# Patient Record
Sex: Female | Born: 1990 | Hispanic: No | Marital: Single | Smoking: Current every day smoker
Health system: Southern US, Community
[De-identification: ages and names within clinical notes are randomized; demographics above are authoritative.]

## PROBLEM LIST (undated history)

## (undated) ENCOUNTER — Inpatient Hospital Stay (HOSPITAL_COMMUNITY): Payer: Self-pay

## (undated) DIAGNOSIS — G8929 Other chronic pain: Secondary | ICD-10-CM

## (undated) DIAGNOSIS — F319 Bipolar disorder, unspecified: Secondary | ICD-10-CM

## (undated) DIAGNOSIS — F1994 Other psychoactive substance use, unspecified with psychoactive substance-induced mood disorder: Secondary | ICD-10-CM

## (undated) DIAGNOSIS — S36113A Laceration of liver, unspecified degree, initial encounter: Secondary | ICD-10-CM

## (undated) DIAGNOSIS — F32A Depression, unspecified: Secondary | ICD-10-CM

## (undated) DIAGNOSIS — D649 Anemia, unspecified: Secondary | ICD-10-CM

## (undated) DIAGNOSIS — F4310 Post-traumatic stress disorder, unspecified: Secondary | ICD-10-CM

## (undated) DIAGNOSIS — F329 Major depressive disorder, single episode, unspecified: Secondary | ICD-10-CM

## (undated) DIAGNOSIS — F419 Anxiety disorder, unspecified: Secondary | ICD-10-CM

## (undated) DIAGNOSIS — F1910 Other psychoactive substance abuse, uncomplicated: Secondary | ICD-10-CM

## (undated) DIAGNOSIS — N760 Acute vaginitis: Secondary | ICD-10-CM

## (undated) DIAGNOSIS — J939 Pneumothorax, unspecified: Secondary | ICD-10-CM

## (undated) DIAGNOSIS — S2239XA Fracture of one rib, unspecified side, initial encounter for closed fracture: Secondary | ICD-10-CM

## (undated) DIAGNOSIS — B9689 Other specified bacterial agents as the cause of diseases classified elsewhere: Secondary | ICD-10-CM

## (undated) DIAGNOSIS — S2249XA Multiple fractures of ribs, unspecified side, initial encounter for closed fracture: Secondary | ICD-10-CM

## (undated) DIAGNOSIS — M549 Dorsalgia, unspecified: Secondary | ICD-10-CM

## (undated) HISTORY — PX: TONSILLECTOMY: SUR1361

## (undated) HISTORY — PX: ADENOIDECTOMY: SHX5191

## (undated) HISTORY — DX: Other specified bacterial agents as the cause of diseases classified elsewhere: B96.89

## (undated) HISTORY — DX: Acute vaginitis: N76.0

## (undated) HISTORY — DX: Other psychoactive substance abuse, uncomplicated: F19.10

## (undated) HISTORY — PX: BLADDER SURGERY: SHX569

---
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
## 2012-06-08 ENCOUNTER — Encounter (HOSPITAL_COMMUNITY): Payer: Self-pay | Admitting: *Deleted

## 2012-06-08 ENCOUNTER — Emergency Department (HOSPITAL_COMMUNITY)
Admission: EM | Admit: 2012-06-08 | Discharge: 2012-06-08 | Disposition: A | Discharge status: Home / Self Care | Payer: Medicaid Other | Attending: Emergency Medicine | Admitting: Emergency Medicine

## 2012-06-08 DIAGNOSIS — F17200 Nicotine dependence, unspecified, uncomplicated: Secondary | ICD-10-CM | POA: Insufficient documentation

## 2012-06-08 DIAGNOSIS — Z9889 Other specified postprocedural states: Secondary | ICD-10-CM | POA: Insufficient documentation

## 2012-06-08 DIAGNOSIS — N390 Urinary tract infection, site not specified: Secondary | ICD-10-CM | POA: Insufficient documentation

## 2012-06-08 LAB — URINE MICROSCOPIC-ADD ON

## 2012-06-08 LAB — URINALYSIS, ROUTINE W REFLEX MICROSCOPIC
Glucose, UA: NEGATIVE mg/dL
Specific Gravity, Urine: 1.025 (ref 1.005–1.030)
Urobilinogen, UA: 0.2 mg/dL (ref 0.0–1.0)

## 2012-06-08 LAB — PREGNANCY, URINE: Preg Test, Ur: NEGATIVE

## 2012-06-08 MED ORDER — HYDROCODONE-ACETAMINOPHEN 5-325 MG PO TABS
1.0000 | ORAL_TABLET | Freq: Once | ORAL | Status: AC
  Administered 2012-06-08: 1 via ORAL
  Filled 2012-06-08: qty 1

## 2012-06-08 MED ORDER — NITROFURANTOIN MONOHYD MACRO 100 MG PO CAPS: 100.0000 mg | ORAL_CAPSULE | Freq: Two times a day (BID) | ORAL | Status: DC

## 2012-06-08 MED ORDER — TRAMADOL HCL 50 MG PO TABS: 50.0000 mg | ORAL_TABLET | Freq: Four times a day (QID) | ORAL | Status: DC | PRN

## 2012-06-08 NOTE — ED Provider Notes (Signed)
History     CSN: [id number]  Arrival date & time 06/08/12  1643   First MD Initiated Contact with Patient 06/08/12 1944      Chief Complaint  Patient presents with  . Hematuria     HPI Pt was seen at 2010. Per pt, c/o gradual onset and persistence of constant dysuria x1 day.  Has been associated with hematuria.  Denies vaginal bleeding/discharge, no flank pain, no N/V/D, no fevers, no abd pain, no rash.     History reviewed. No pertinent past medical history.  Past Surgical History  Procedure Date  . Bladder surgery   . Tonsillectomy   . Adenoidectomy      History  Substance Use Topics  . Smoking status: Current Every Day Smoker  . Smokeless tobacco: Not on file  . Alcohol Use: Yes    Review of Systems ROS: Statement: All systems negative except as marked or noted in the HPI; Constitutional: Negative for fever and chills. ; ; Eyes: Negative for eye pain, redness and discharge. ; ; ENMT: Negative for ear pain, hoarseness, nasal congestion, sinus pressure and sore throat. ; ; Cardiovascular: Negative for chest pain, palpitations, diaphoresis, dyspnea and peripheral edema. ; ; Respiratory: Negative for cough, wheezing and stridor. ; ; Gastrointestinal: Negative for nausea, vomiting, diarrhea, abdominal pain, blood in stool, hematemesis, jaundice and rectal bleeding. . ; ; Genitourinary: +dysuria, hematuria. Negative for flank pain.;; GYN:  No vaginal bleeding, no vaginal discharge, no vulvar pain.;;  Musculoskeletal: Negative for back pain and neck pain. Negative for swelling and trauma.; ; Skin: Negative for pruritus, rash, abrasions, blisters, bruising and skin lesion.; ; Neuro: Negative for headache, lightheadedness and neck stiffness. Negative for weakness, altered level of consciousness , altered mental status, extremity weakness, paresthesias, involuntary movement, seizure and syncope.       Allergies  Latex and Penicillins  Home Medications   Current Outpatient Rx    Name Route Sig Dispense Refill  . ACETAMINOPHEN 500 MG PO TABS Oral Take 500 mg by mouth every 6 (six) hours as needed. For pain      BP 130/83  Pulse 74  Temp 98.1 F (36.7 C) (Oral)  Resp 18  Ht 5\' 4"  (1.626 m)  Wt 155 lb (70.308 kg)  BMI 26.61 kg/m2  SpO2 100%  LMP 05/19/2012  Physical Exam 2015: Physical examination:  Nursing notes reviewed; Vital signs and O2 SAT reviewed;  Constitutional: Well developed, Well nourished, Well hydrated, In no acute distress; Head:  Normocephalic, atraumatic; Eyes: EOMI, PERRL, No scleral icterus; ENMT: Mouth and pharynx normal, Mucous membranes moist; Neck: Supple, Full range of motion, No lymphadenopathy; Cardiovascular: Regular rate and rhythm, No murmur, rub, or gallop; Respiratory: Breath sounds clear & equal bilaterally, No rales, rhonchi, wheezes.  Speaking full sentences with ease, Normal respiratory effort/excursion; Chest: Nontender, Movement normal; Abdomen: Soft, Nontender, Nondistended, Normal bowel sounds; Genitourinary: No CVA tenderness; Extremities: Pulses normal, No tenderness, No edema, No calf edema or asymmetry.; Neuro: AA&Ox3, Major CN grossly intact.  Speech clear. No gross focal motor or sensory deficits in extremities.; Skin: Color normal, Warm, Dry.   ED Course  Procedures    MDM  MDM Reviewed: nursing note and vitals Interpretation: labs     Results for orders placed during the hospital encounter of 06/08/12  URINALYSIS, ROUTINE W REFLEX MICROSCOPIC      Component Value Range   Color, Urine YELLOW  YELLOW   APPearance HAZY (*) CLEAR   Specific Gravity, Urine 1.025  1.005 -  1.030   pH 7.5  5.0 - 8.0   Glucose, UA NEGATIVE  NEGATIVE mg/dL   Hgb urine dipstick LARGE (*) NEGATIVE   Bilirubin Urine SMALL (*) NEGATIVE   Ketones, ur NEGATIVE  NEGATIVE mg/dL   Protein, ur >409 (*) NEGATIVE mg/dL   Urobilinogen, UA 0.2  0.0 - 1.0 mg/dL   Nitrite POSITIVE (*) NEGATIVE   Leukocytes, UA TRACE (*) NEGATIVE  PREGNANCY,  URINE      Component Value Range   Preg Test, Ur NEGATIVE  NEGATIVE  URINE MICROSCOPIC-ADD ON      Component Value Range   Squamous Epithelial / LPF RARE  RARE   WBC, UA 21-50  <3 WBC/hpf   RBC / HPF TOO NUMEROUS TO COUNT  <3 RBC/hpf   Bacteria, UA MANY (*) RARE    2020:  +UTI, will tx.  Pt requesting "something for pain stronger than tylenol."  Wants to go home now.  Dx and testing d/w pt.  Questions answered.  Verb understanding, agreeable to d/c home with outpt f/u.          Laray Anger, DO 06/10/12 2156

## 2012-06-08 NOTE — ED Notes (Signed)
Pt alert & oriented x4, stable gait. Patient given discharge instructions, paperwork & prescription(s). Patient  instructed to stop at the registration desk to finish any additional paperwork. Patient verbalized understanding. Pt left department w/ no further questions. 

## 2012-06-08 NOTE — ED Notes (Signed)
Hematuria x 1 week, dysuria, ,back and low abd pain..  Nausea, no vomiting

## 2012-06-10 LAB — URINE CULTURE

## 2012-06-11 NOTE — ED Notes (Signed)
+   urine Patient treated with Macrobid-sensitive to same-chart appended per protocol MD. 

## 2013-03-05 ENCOUNTER — Encounter (HOSPITAL_COMMUNITY): Payer: Self-pay | Admitting: Emergency Medicine

## 2013-03-05 ENCOUNTER — Emergency Department (HOSPITAL_COMMUNITY)
Admission: EM | Admit: 2013-03-05 | Discharge: 2013-03-05 | Disposition: A | Discharge status: Home / Self Care | Payer: Self-pay | Attending: Emergency Medicine | Admitting: Emergency Medicine

## 2013-03-05 DIAGNOSIS — S24101A Unspecified injury at T1 level of thoracic spinal cord, initial encounter: Secondary | ICD-10-CM | POA: Insufficient documentation

## 2013-03-05 DIAGNOSIS — Z880 Allergy status to penicillin: Secondary | ICD-10-CM | POA: Insufficient documentation

## 2013-03-05 DIAGNOSIS — Y929 Unspecified place or not applicable: Secondary | ICD-10-CM | POA: Insufficient documentation

## 2013-03-05 DIAGNOSIS — M545 Low back pain: Secondary | ICD-10-CM

## 2013-03-05 DIAGNOSIS — Y9389 Activity, other specified: Secondary | ICD-10-CM | POA: Insufficient documentation

## 2013-03-05 DIAGNOSIS — Z79899 Other long term (current) drug therapy: Secondary | ICD-10-CM | POA: Insufficient documentation

## 2013-03-05 DIAGNOSIS — Y990 Civilian activity done for income or pay: Secondary | ICD-10-CM | POA: Insufficient documentation

## 2013-03-05 DIAGNOSIS — IMO0002 Reserved for concepts with insufficient information to code with codable children: Secondary | ICD-10-CM | POA: Insufficient documentation

## 2013-03-05 DIAGNOSIS — Z91040 Latex allergy status: Secondary | ICD-10-CM | POA: Insufficient documentation

## 2013-03-05 DIAGNOSIS — X503XXA Overexertion from repetitive movements, initial encounter: Secondary | ICD-10-CM | POA: Insufficient documentation

## 2013-03-05 DIAGNOSIS — F17200 Nicotine dependence, unspecified, uncomplicated: Secondary | ICD-10-CM | POA: Insufficient documentation

## 2013-03-05 MED ORDER — TRAMADOL HCL 50 MG PO TABS
100.0000 mg | ORAL_TABLET | Freq: Once | ORAL | Status: DC
  Filled 2013-03-05: qty 2

## 2013-03-05 MED ORDER — PREDNISONE 10 MG PO TABS: ORAL_TABLET | ORAL | Status: DC

## 2013-03-05 MED ORDER — METOCLOPRAMIDE HCL 10 MG PO TABS
10.0000 mg | ORAL_TABLET | Freq: Once | ORAL | Status: AC
  Administered 2013-03-05: 10 mg via ORAL
  Filled 2013-03-05: qty 1

## 2013-03-05 MED ORDER — HYDROCODONE-ACETAMINOPHEN 5-325 MG PO TABS: 1.0000 | ORAL_TABLET | ORAL | Status: DC | PRN

## 2013-03-05 MED ORDER — PROMETHAZINE HCL 12.5 MG PO TABS
12.5000 mg | ORAL_TABLET | Freq: Once | ORAL | Status: DC
  Filled 2013-03-05: qty 1

## 2013-03-05 MED ORDER — PREDNISONE 50 MG PO TABS
60.0000 mg | ORAL_TABLET | Freq: Once | ORAL | Status: AC
  Administered 2013-03-05: 60 mg via ORAL
  Filled 2013-03-05: qty 1

## 2013-03-05 MED ORDER — HYDROCODONE-ACETAMINOPHEN 5-325 MG PO TABS
2.0000 | ORAL_TABLET | Freq: Once | ORAL | Status: AC
  Administered 2013-03-05: 2 via ORAL
  Filled 2013-03-05: qty 2

## 2013-03-05 NOTE — ED Provider Notes (Signed)
CSN: [id number]     Arrival date & time 03/05/13  1939 History     First MD Initiated Contact with Patient 03/05/13 2002     Chief Complaint  Patient presents with  . Back Pain   (Consider location/radiation/quality/duration/timing/severity/associated sxs/prior Treatment) Patient is a 22 y.o. female presenting with back pain. The history is provided by the patient.  Back Pain Location:  Thoracic spine and lumbar spine Quality:  Aching and shooting Radiates to:  L thigh and R thigh Pain severity:  Severe Pain is:  Same all the time Onset quality:  Gradual Duration:  1 month Timing:  Intermittent Progression:  Worsening Context: lifting heavy objects   Relieved by:  Nothing Worsened by:  Movement Associated symptoms: no abdominal pain, no bladder incontinence, no bowel incontinence, no chest pain, no dysuria and no perianal numbness     History reviewed. No pertinent past medical history. Past Surgical History  Procedure Laterality Date  . Bladder surgery    . Tonsillectomy    . Adenoidectomy     No family history on file. History  Substance Use Topics  . Smoking status: Current Every Day Smoker -- 0.50 packs/day    Types: Cigarettes  . Smokeless tobacco: Not on file  . Alcohol Use: No   OB History   Grav Para Term Preterm Abortions TAB SAB Ect Mult Living                 Review of Systems  Constitutional: Negative for activity change.       All ROS Neg except as noted in HPI  HENT: Negative for nosebleeds and neck pain.   Eyes: Negative for photophobia and discharge.  Respiratory: Negative for cough, shortness of breath and wheezing.   Cardiovascular: Negative for chest pain and palpitations.  Gastrointestinal: Negative for abdominal pain, blood in stool and bowel incontinence.  Genitourinary: Negative for bladder incontinence, dysuria, frequency and hematuria.  Musculoskeletal: Positive for back pain. Negative for arthralgias.  Skin: Negative.    Neurological: Negative for dizziness, seizures and speech difficulty.  Psychiatric/Behavioral: Negative for hallucinations and confusion.    Allergies  Latex and Penicillins  Home Medications   Current Outpatient Rx  Name  Route  Sig  Dispense  Refill  . acetaminophen (TYLENOL) 500 MG tablet   Oral   Take 500 mg by mouth every 6 (six) hours as needed. For pain         . nitrofurantoin, macrocrystal-monohydrate, (MACROBID) 100 MG capsule   Oral   Take 1 capsule (100 mg total) by mouth 2 (two) times daily.   14 capsule   0   . traMADol (ULTRAM) 50 MG tablet   Oral   Take 1 tablet (50 mg total) by mouth every 6 (six) hours as needed for pain.   15 tablet   0    BP 130/80  Pulse 60  Temp(Src) 98.4 F (36.9 C) (Oral)  Resp 18  Ht 5\' 3"  (1.6 m)  Wt 140 lb (63.504 kg)  BMI 24.81 kg/m2  SpO2 95%  LMP 02/13/2013 Physical Exam  Nursing note and vitals reviewed. Constitutional: She is oriented to person, place, and time. She appears well-developed and well-nourished.  Non-toxic appearance.  HENT:  Head: Normocephalic.  Right Ear: Tympanic membrane and external ear normal.  Left Ear: Tympanic membrane and external ear normal.  Eyes: EOM and lids are normal. Pupils are equal, round, and reactive to light.  Neck: Normal range of motion. Neck supple. Carotid bruit  is not present.  Cardiovascular: Normal rate, regular rhythm, normal heart sounds, intact distal pulses and normal pulses.   Pulmonary/Chest: Breath sounds normal. No respiratory distress.  Abdominal: Soft. Bowel sounds are normal. There is no tenderness. There is no guarding.  Musculoskeletal: Normal range of motion.  There is pain to palpation of the lumbar spine area. There is paraspinal tenderness noted to palpation and with attempted range of motion. There is no palpable step off. There no hot areas appreciated.  Lymphadenopathy:       Head (right side): No submandibular adenopathy present.       Head (left  side): No submandibular adenopathy present.    She has no cervical adenopathy.  Neurological: She is alert and oriented to person, place, and time. She has normal strength. No cranial nerve deficit or sensory deficit.  Gait is steady. No gross neurologic deficits appreciated.  Skin: Skin is warm and dry.  Psychiatric: She has a normal mood and affect. Her speech is normal.    ED Course   Procedures (including critical care time)  Labs Reviewed - No data to display No results found. No diagnosis found.  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 Patient states that she was in the midst of being seen by a pain management specialist and neurologist. She states that she lost her Medicaid and has not been able to see anyone concerning her back. The patient states that now she is having pain that is interfering with her activities of daily living. There has not been any loss of bowel or bladder function, and there's been no falls.  No gross neurologic deficit appreciated on today's examination. Rx for norco  And prednisone given to the patient. Pt to follow up with orthopedic group in [place] next week.   Kathie Dike, PA-C 03/07/13 0022

## 2013-03-05 NOTE — ED Notes (Signed)
Pt complaining of back pain. States it started a month ago when lifting a patient, pain in  Mid to low back with shooting pain down both legs.

## 2013-03-05 NOTE — ED Notes (Signed)
Pt c/o mid to lower back pain x2-3 months. 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 Pain becomes worse with movement. Pt has seen Dr. Gerilyn Pilgrim for pain management denies relief from pain.

## 2013-03-15 NOTE — ED Provider Notes (Signed)
Medical screening examination/treatment/procedure(s) were performed by non-physician practitioner and as supervising physician I was immediately available for consultation/collaboration. Devoria Albe, MD, [facility]   Ward Givens, MD 03/15/13 [phone]

## 2014-05-02 ENCOUNTER — Emergency Department (HOSPITAL_COMMUNITY)
Admission: EM | Admit: 2014-05-02 | Discharge: 2014-05-03 | Disposition: A | Discharge status: Other Acute Inpatient Hospital | Payer: MEDICAID | Attending: Emergency Medicine | Admitting: Emergency Medicine

## 2014-05-02 ENCOUNTER — Emergency Department (HOSPITAL_COMMUNITY): Payer: MEDICAID

## 2014-05-02 ENCOUNTER — Encounter (HOSPITAL_COMMUNITY): Payer: Self-pay | Admitting: Emergency Medicine

## 2014-05-02 DIAGNOSIS — J189 Pneumonia, unspecified organism: Secondary | ICD-10-CM | POA: Diagnosis not present

## 2014-05-02 DIAGNOSIS — F1210 Cannabis abuse, uncomplicated: Secondary | ICD-10-CM | POA: Insufficient documentation

## 2014-05-02 DIAGNOSIS — F1310 Sedative, hypnotic or anxiolytic abuse, uncomplicated: Secondary | ICD-10-CM | POA: Insufficient documentation

## 2014-05-02 DIAGNOSIS — M549 Dorsalgia, unspecified: Secondary | ICD-10-CM | POA: Diagnosis not present

## 2014-05-02 DIAGNOSIS — F1110 Opioid abuse, uncomplicated: Secondary | ICD-10-CM | POA: Diagnosis present

## 2014-05-02 DIAGNOSIS — R000 Tachycardia, unspecified: Secondary | ICD-10-CM | POA: Diagnosis not present

## 2014-05-02 DIAGNOSIS — F17200 Nicotine dependence, unspecified, uncomplicated: Secondary | ICD-10-CM | POA: Diagnosis not present

## 2014-05-02 DIAGNOSIS — R51 Headache: Secondary | ICD-10-CM | POA: Diagnosis not present

## 2014-05-02 DIAGNOSIS — F1410 Cocaine abuse, uncomplicated: Secondary | ICD-10-CM | POA: Diagnosis not present

## 2014-05-02 DIAGNOSIS — R509 Fever, unspecified: Secondary | ICD-10-CM | POA: Insufficient documentation

## 2014-05-02 DIAGNOSIS — F1910 Other psychoactive substance abuse, uncomplicated: Secondary | ICD-10-CM

## 2014-05-02 LAB — URINALYSIS, ROUTINE W REFLEX MICROSCOPIC
Bilirubin Urine: NEGATIVE
Glucose, UA: NEGATIVE mg/dL
Hgb urine dipstick: NEGATIVE
Ketones, ur: NEGATIVE mg/dL
Leukocytes, UA: NEGATIVE
NITRITE: NEGATIVE
PH: 7 (ref 5.0–8.0)
Protein, ur: NEGATIVE mg/dL
SPECIFIC GRAVITY, URINE: 1.015 (ref 1.005–1.030)
Urobilinogen, UA: 1 mg/dL (ref 0.0–1.0)

## 2014-05-02 LAB — RAPID URINE DRUG SCREEN, HOSP PERFORMED
AMPHETAMINES: NOT DETECTED
Barbiturates: NOT DETECTED
Benzodiazepines: POSITIVE — AB
Cocaine: POSITIVE — AB
Opiates: POSITIVE — AB
Tetrahydrocannabinol: POSITIVE — AB

## 2014-05-02 LAB — CBC WITH DIFFERENTIAL/PLATELET
BASOS PCT: 0 % (ref 0–1)
Basophils Absolute: 0 10*3/uL (ref 0.0–0.1)
EOS ABS: 0.1 10*3/uL (ref 0.0–0.7)
Eosinophils Relative: 0 % (ref 0–5)
HCT: 39.1 % (ref 36.0–46.0)
Hemoglobin: 12.9 g/dL (ref 12.0–15.0)
LYMPHS ABS: 1.7 10*3/uL (ref 0.7–4.0)
LYMPHS PCT: 9 % — AB (ref 12–46)
MCH: 28.5 pg (ref 26.0–34.0)
MCHC: 33 g/dL (ref 30.0–36.0)
MCV: 86.5 fL (ref 78.0–100.0)
MONO ABS: 0.2 10*3/uL (ref 0.1–1.0)
Monocytes Relative: 1 % — ABNORMAL LOW (ref 3–12)
NEUTROS ABS: 15.6 10*3/uL — AB (ref 1.7–7.7)
Neutrophils Relative %: 90 % — ABNORMAL HIGH (ref 43–77)
Platelets: 203 10*3/uL (ref 150–400)
RBC: 4.52 MIL/uL (ref 3.87–5.11)
RDW: 13.8 % (ref 11.5–15.5)
WBC: 17.5 10*3/uL — ABNORMAL HIGH (ref 4.0–10.5)

## 2014-05-02 LAB — COMPREHENSIVE METABOLIC PANEL
ALK PHOS: 82 U/L (ref 39–117)
ALT: 20 U/L (ref 0–35)
ANION GAP: 12 (ref 5–15)
AST: 26 U/L (ref 0–37)
Albumin: 3.8 g/dL (ref 3.5–5.2)
BUN: 7 mg/dL (ref 6–23)
CALCIUM: 9.2 mg/dL (ref 8.4–10.5)
CO2: 27 mEq/L (ref 19–32)
Chloride: 101 mEq/L (ref 96–112)
Creatinine, Ser: 0.87 mg/dL (ref 0.50–1.10)
GFR calc Af Amer: >90 mL/min (ref >90)
Glucose, Bld: 93 mg/dL (ref 70–99)
POTASSIUM: 4 meq/L (ref 3.7–5.3)
Sodium: 140 mEq/L (ref 137–147)
TOTAL PROTEIN: 8.7 g/dL — AB (ref 6.0–8.3)
Total Bilirubin: 0.3 mg/dL (ref 0.3–1.2)

## 2014-05-02 LAB — TROPONIN I

## 2014-05-02 LAB — PREGNANCY, URINE: PREG TEST UR: NEGATIVE

## 2014-05-02 MED ORDER — THIAMINE HCL 100 MG/ML IJ SOLN: 100.0000 mg | Freq: Every day | INTRAMUSCULAR | Status: DC

## 2014-05-02 MED ORDER — LORAZEPAM 2 MG/ML IJ SOLN: 1.0000 mg | Freq: Four times a day (QID) | INTRAMUSCULAR | Status: DC | PRN

## 2014-05-02 MED ORDER — DEXTROSE 5 % IV SOLN
500.0000 mg | Freq: Once | INTRAVENOUS | Status: AC
  Administered 2014-05-02: 500 mg via INTRAVENOUS
  Filled 2014-05-02: qty 500

## 2014-05-02 MED ORDER — GADOBENATE DIMEGLUMINE 529 MG/ML IV SOLN
13.0000 mL | Freq: Once | INTRAVENOUS | Status: AC | PRN
  Administered 2014-05-02: 13 mL via INTRAVENOUS

## 2014-05-02 MED ORDER — AZITHROMYCIN 250 MG PO TABS: 250.0000 mg | ORAL_TABLET | Freq: Every day | ORAL | Status: DC

## 2014-05-02 MED ORDER — SODIUM CHLORIDE 0.9 % IV BOLUS (SEPSIS)
1000.0000 mL | Freq: Once | INTRAVENOUS | Status: AC
  Administered 2014-05-02: 1000 mL via INTRAVENOUS

## 2014-05-02 MED ORDER — DICYCLOMINE HCL 10 MG PO CAPS
10.0000 mg | ORAL_CAPSULE | Freq: Once | ORAL | Status: AC
  Administered 2014-05-02: 10 mg via ORAL
  Filled 2014-05-02: qty 1

## 2014-05-02 MED ORDER — LORAZEPAM 1 MG PO TABS: 0.0000 mg | ORAL_TABLET | Freq: Two times a day (BID) | ORAL | Status: DC

## 2014-05-02 MED ORDER — LORAZEPAM 1 MG PO TABS
0.0000 mg | ORAL_TABLET | Freq: Four times a day (QID) | ORAL | Status: DC
  Filled 2014-05-02: qty 1

## 2014-05-02 MED ORDER — CEFTRIAXONE SODIUM 1 G IJ SOLR
1.0000 g | Freq: Once | INTRAMUSCULAR | Status: AC
  Administered 2014-05-02: 1 g via INTRAVENOUS
  Filled 2014-05-02: qty 10

## 2014-05-02 MED ORDER — ADULT MULTIVITAMIN W/MINERALS CH
1.0000 | ORAL_TABLET | Freq: Every day | ORAL | Status: DC
  Administered 2014-05-02: 1 via ORAL
  Filled 2014-05-02: qty 1

## 2014-05-02 MED ORDER — AZITHROMYCIN 250 MG PO TABS
250.0000 mg | ORAL_TABLET | Freq: Once | ORAL | Status: DC
Start: 2014-05-02 — End: 2014-05-03

## 2014-05-02 MED ORDER — LORAZEPAM 1 MG PO TABS
1.0000 mg | ORAL_TABLET | Freq: Four times a day (QID) | ORAL | Status: DC | PRN
  Administered 2014-05-02: 1 mg via ORAL

## 2014-05-02 MED ORDER — VITAMIN B-1 100 MG PO TABS
100.0000 mg | ORAL_TABLET | Freq: Every day | ORAL | Status: DC
  Administered 2014-05-02: 100 mg via ORAL
  Filled 2014-05-02: qty 1

## 2014-05-02 MED ORDER — FOLIC ACID 1 MG PO TABS
1.0000 mg | ORAL_TABLET | Freq: Every day | ORAL | Status: DC
  Administered 2014-05-02: 1 mg via ORAL
  Filled 2014-05-02: qty 1

## 2014-05-02 MED ORDER — ONDANSETRON HCL 4 MG/2ML IJ SOLN
4.0000 mg | Freq: Once | INTRAMUSCULAR | Status: AC
  Administered 2014-05-02: 4 mg via INTRAVENOUS
  Filled 2014-05-02: qty 2

## 2014-05-02 MED ORDER — NICOTINE 14 MG/24HR TD PT24
14.0000 mg | MEDICATED_PATCH | Freq: Once | TRANSDERMAL | Status: DC
  Administered 2014-05-02: 14 mg via TRANSDERMAL
  Filled 2014-05-02: qty 1

## 2014-05-02 NOTE — Progress Notes (Addendum)
Per Donell Sievert, PA patient meets criteria for inpatient hospitalization.  Patient accepted to [facility] Bed 300-1.  Writer informed the ER MD (Dr.Cook) and the nurse working with the patient.  The nurse will arrange transportation through [place].

## 2014-05-02 NOTE — ED Notes (Signed)
Meat tray provided.

## 2014-05-02 NOTE — ED Notes (Signed)
Meal tray ordered 

## 2014-05-02 NOTE — ED Notes (Signed)
Patient states she wants detox from "heroin and pain pills." States the last time she used was dilaudid at 0400 this morning. Patient complaining of generalized body aches and chills.

## 2014-05-02 NOTE — BH Assessment (Signed)
Writer faxed support paperwork to [facility].

## 2014-05-02 NOTE — ED Provider Notes (Signed)
CSN: [id number]     Arrival date & time 05/02/14  1415 History  This chart was scribed for Glynn Octave, MD by Carl Best, ED Scribe. This patient was seen in room APA08/APA08 and the patient's care was started at 2:44 PM.     Chief Complaint  Patient presents with  . V70.1    The history is provided by the patient. No language interpreter was used.   HPI Comments: Abigail [name] is a 23 y.o. female who presents to the Emergency Department wanting to detox.  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  She last used cocaine 3 nights ago.  She injects in her hands bilaterally.  She last used drugs this morning.  She lists low-grade fever of 99.5 degrees, chills, palpitations, and headache as associated symptoms.  She has had back pain since she was 23 years old and the back pain she is experiencing currently is not any different from baseline.  She denies incontinence, SOB, vomiting, HI, SI, chest pain, abdominal pain, lightheaded, dizziness, diarrhea, and nausea as associated symptoms.  She is not taking any of her prescribed medications.  She does not drink alcohol excessively.     Past Medical History  Diagnosis Date  . Substance abuse    Past Surgical History  Procedure Laterality Date  . Bladder surgery    . Tonsillectomy    . Adenoidectomy     History reviewed. No pertinent family history. History  Substance Use Topics  . Smoking status: Current Every Day Smoker -- 0.50 packs/day    Types: Cigarettes  . Smokeless tobacco: Not on file  . Alcohol Use: Yes     Comment: occ   OB History   Grav Para Term Preterm Abortions TAB SAB Ect Mult Living                 Review of Systems A complete 10 system review of systems was obtained and all systems are negative except as noted in the HPI and PMH.    Allergies  Latex and Penicillins  Home Medications   Prior to Admission medications   Not on File   BP 101/62   Pulse 102  Temp(Src) 99.3 F (37.4 C) (Oral)  Resp 16  Ht  (1.676 m)  Wt 145 lb (65.772 kg)  BMI 23.41 kg/m2  SpO2 100%  LMP 04/01/2014 Physical Exam  Nursing note and vitals reviewed. Constitutional: She is oriented to person, place, and time. She appears well-developed and well-nourished. No distress.  HENT:  Head: Normocephalic and atraumatic.  Mouth/Throat: Oropharynx is clear and moist. No oropharyngeal exudate.  Eyes: Conjunctivae and EOM are normal. Pupils are equal, round, and reactive to light.  Neck: Normal range of motion. Neck supple.  No meningismus.  Cardiovascular: Regular rhythm, normal heart sounds and intact distal pulses.  Tachycardia present.   No murmur heard. Pulmonary/Chest: Effort normal and breath sounds normal. No respiratory distress.  Abdominal: Soft. There is no tenderness. There is no rebound and no guarding.  Musculoskeletal: Normal range of motion. She exhibits no edema and no tenderness.  Back is diffusely tender without pinpoint midline tenderness. 5/5 strength in bilateral lower extremities. Ankle plantar and dorsiflexion intact. Great toe extension intact bilaterally. +2 DP and PT pulses. +2 patellar reflexes bilaterally. Normal gait.   Neurological: She is alert and oriented to person, place, and time. No cranial nerve deficit. She exhibits normal muscle tone. Coordination normal.  No ataxia on finger to nose bilaterally. No pronator drift. 5/5 strength throughout. CN 2-12 intact. Negative Romberg. Equal grip strength. Sensation intact. Gait is normal.   Skin: Skin is warm.  Track marks bilateral dorsal hands.    Psychiatric: She has a normal mood and affect. Her behavior is normal.    ED Course  Procedures (including critical care time)  DIAGNOSTIC STUDIES: Oxygen Saturation is 100% on room air, normal by my interpretation.    COORDINATION OF CARE: 2:47 PM- Will check blood work and UA to medically clear the patient.  The patient  agreed to the treatment plan.    Labs Review Labs Reviewed  CBC WITH DIFFERENTIAL - Abnormal; Notable for the following:    WBC 17.5 (*)    Neutrophils Relative % 90 (*)    Neutro Abs 15.6 (*)    Lymphocytes Relative 9 (*)    Monocytes Relative 1 (*)    All other components within normal limits  COMPREHENSIVE METABOLIC PANEL - Abnormal; Notable for the following:    Total Protein 8.7 (*)    All other components within normal limits  CULTURE, BLOOD (ROUTINE X 2)  CULTURE, BLOOD (ROUTINE X 2)  URINALYSIS, ROUTINE W REFLEX MICROSCOPIC  PREGNANCY, URINE  TROPONIN I    Imaging Review Dg Chest 2 View  05/02/2014   CLINICAL DATA:  Chest pain for 2-3 days. Cough for 1 week. Palpitations, chills, low-grade fever.  EXAM: CHEST  2 VIEW  COMPARISON:  None.  FINDINGS: The heart size and mediastinal contours are within normal limits. There is mild patchy opacity of medial right lung base. There is no pulmonary edema or pleural effusion. The visualized skeletal structures are unremarkable.  IMPRESSION: Mild patchy opacity of medial right lung base, early pneumonia is not excluded.   Electronically Signed   By: Sherian Rein M.D.   On: 05/02/2014 16:30   Mr Thoracic Spine W Wo Contrast  05/02/2014   CLINICAL DATA:  23 year old female with fever and back pain. IV drug abuse. Initial encounter.  EXAM: MRI THORACIC SPINE WITHOUT AND WITH CONTRAST  TECHNIQUE: Multiplanar and multiecho pulse sequences of the thoracic spine were obtained without and with intravenous contrast.  CONTRAST:  13mL MULTIHANCE GADOBENATE DIMEGLUMINE 529 MG/ML IV SOLN  COMPARISON:  Chest radiographs 05/02/2014.  FINDINGS: Limited sagittal imaging of the cervical spine is unremarkable.  Normal thoracic vertebral height, alignment, and bone marrow signal. No marrow edema or evidence of acute osseous abnormality. No abnormal enhancement identified.  Normal lower cervical and thoracic intervertebral disc signal and morphology.  No thoracic  spinal stenosis. Spinal cord signal is within normal limits at all visualized levels. Normal conus medullaris at L1.  Negative posterior paraspinal soft tissues. Negative thoracic inlet, visualized thoracic viscera (pulmonary atelectasis), and visualized upper abdominal viscera.  IMPRESSION: Negative thoracic spine MRI.   Electronically Signed   By: Augusto Gamble M.D.   On: 05/02/2014 18:35   Mr Lumbar Spine W Wo Contrast  05/02/2014   CLINICAL DATA:  23 year old female with fever and back pain. IV drug abuse. Initial encounter.  EXAM: MRI LUMBAR SPINE WITHOUT AND WITH CONTRAST  TECHNIQUE: Multiplanar and multiecho pulse sequences of the lumbar spine were obtained without and with intravenous contrast.  CONTRAST:  13mL MULTIHANCE GADOBENATE DIMEGLUMINE 529 MG/ML IV SOLN in conjunction with contrast enhanced imaging of the thoracic reported separately.  COMPARISON:  Thoracic spine MRI from today reported separately. Lumbar radiographs 04/20/2009.  FINDINGS: Normal lumbar segmentation demonstrated in 2010, in  this does appear congruent with today's thoracic numbering. Normal lumbar vertebral height, alignment, and marrow signal. Negative visualized sacrum. No marrow edema or evidence of acute osseous abnormality.  Visualized lower thoracic spinal cord is normal with conus medularis at L1-L2. Normal cauda equina nerve roots. No abnormal enhancement identified.  Visualized abdominal viscera and paraspinal soft tissues are within normal limits.  Normal lumbar intervertebral disc signal and morphology. No lumbar spinal stenosis or neural impingement.  IMPRESSION: Negative lumbar MRI.   Electronically Signed   By: Augusto Gamble M.D.   On: 05/02/2014 18:40     EKG Interpretation   Date/Time:  Tuesday May 02 2014 14:56:14 EDT Ventricular Rate:  111 PR Interval:  108 QRS Duration: 73 QT Interval:  316 QTC Calculation: 429 R Axis:   93 Text Interpretation:  Sinus tachycardia Ventricular premature complex   Borderline right axis deviation Borderline T abnormalities, anterior leads  Baseline wander in lead(s) V2 V3 V4 V5 V6 No previous ECGs available  Confirmed by Manus Gunning  MD, Troi Bechtold [phone]) on 05/02/2014 3:04:59 PM      MDM   Final diagnoses:  CAP (community acquired pneumonia)  Polysubstance abuse   Patient requesting detox from IV opiate abuse and heroine abuse. Also does abuse crack. Denies any alcohol abuse. She endorses chills and body aches. Denies fever. She is tachycardic and hypertensive.  Rectal temp is 101.9. Tachycardic to 140. She endorses her back pain is at baseline and has no focal areas of point tenderness. Labs and blood cultures will be obtained.  WBC 17.  UA negative.  Patient does have a moist cough. X-ray shows mild opacity in the right lung base concerning for pneumonia.  This may not completely explain her fever or leukocytosis. Especially in the setting of IV drug abuse and back pain. We'll proceed with MRI to rule out epidural abscess or discitis.  HR improved to 90s.  100% saturation on RA.   MRI negative for discitis or epidural abscess. Pneumonia is likely source of patient's fever and leukocytosis. She is given Rocephin and azithromycin. No hypoxia. Vitals improved.  Patient now states she abuses Xanax as well and wants detox from that.  Will d.w TTS.  Holding orders placed including CIWA protocol Patient will need treatment for pneumonia if discharged.  BP 101/62  Pulse 102  Temp(Src) 99.3 F (37.4 C) (Oral)  Resp 16  Ht  (1.676 m)  Wt 145 lb (65.772 kg)  BMI 23.41 kg/m2  SpO2 100%  LMP 04/01/2014   I personally performed the services described in this documentation, which was scribed in my presence. The recorded information has been reviewed and is accurate.   Glynn Octave, MD 05/02/14 2119

## 2014-05-02 NOTE — ED Notes (Signed)
Ava from [facility] at bedside

## 2014-05-03 ENCOUNTER — Inpatient Hospital Stay (HOSPITAL_COMMUNITY)
Admission: EM | Admit: 2014-05-03 | Discharge: 2014-05-11 | DRG: 897 | Disposition: A | Discharge status: Home / Self Care | Payer: MEDICAID | Source: Intra-hospital | Attending: Psychiatry | Admitting: Psychiatry

## 2014-05-03 ENCOUNTER — Encounter (HOSPITAL_COMMUNITY): Payer: Self-pay | Admitting: *Deleted

## 2014-05-03 DIAGNOSIS — F1994 Other psychoactive substance use, unspecified with psychoactive substance-induced mood disorder: Secondary | ICD-10-CM | POA: Diagnosis present

## 2014-05-03 DIAGNOSIS — F1123 Opioid dependence with withdrawal: Secondary | ICD-10-CM | POA: Diagnosis present

## 2014-05-03 DIAGNOSIS — F17210 Nicotine dependence, cigarettes, uncomplicated: Secondary | ICD-10-CM | POA: Diagnosis present

## 2014-05-03 DIAGNOSIS — F329 Major depressive disorder, single episode, unspecified: Secondary | ICD-10-CM | POA: Diagnosis present

## 2014-05-03 DIAGNOSIS — F39 Unspecified mood [affective] disorder: Secondary | ICD-10-CM | POA: Diagnosis present

## 2014-05-03 DIAGNOSIS — Z590 Homelessness: Secondary | ICD-10-CM

## 2014-05-03 DIAGNOSIS — F419 Anxiety disorder, unspecified: Secondary | ICD-10-CM | POA: Diagnosis present

## 2014-05-03 DIAGNOSIS — G4700 Insomnia, unspecified: Secondary | ICD-10-CM | POA: Diagnosis present

## 2014-05-03 DIAGNOSIS — M791 Myalgia: Secondary | ICD-10-CM | POA: Diagnosis present

## 2014-05-03 LAB — TSH: TSH: 0.244 u[IU]/mL — ABNORMAL LOW (ref 0.350–4.500)

## 2014-05-03 MED ORDER — MAGNESIUM HYDROXIDE 400 MG/5ML PO SUSP: 30.0000 mL | Freq: Every day | ORAL | Status: DC | PRN

## 2014-05-03 MED ORDER — HYDROXYZINE HCL 25 MG PO TABS
25.0000 mg | ORAL_TABLET | Freq: Four times a day (QID) | ORAL | Status: AC | PRN
  Administered 2014-05-03 – 2014-05-07 (×9): 25 mg via ORAL
  Filled 2014-05-03 (×11): qty 1

## 2014-05-03 MED ORDER — DICYCLOMINE HCL 20 MG PO TABS
20.0000 mg | ORAL_TABLET | Freq: Four times a day (QID) | ORAL | Status: AC | PRN
  Administered 2014-05-03 – 2014-05-07 (×13): 20 mg via ORAL
  Filled 2014-05-03 (×13): qty 1

## 2014-05-03 MED ORDER — CLONIDINE HCL 0.1 MG PO TABS
0.1000 mg | ORAL_TABLET | Freq: Four times a day (QID) | ORAL | Status: AC
  Administered 2014-05-03 – 2014-05-05 (×7): 0.1 mg via ORAL
  Filled 2014-05-03 (×11): qty 1

## 2014-05-03 MED ORDER — METHOCARBAMOL 500 MG PO TABS
500.0000 mg | ORAL_TABLET | Freq: Three times a day (TID) | ORAL | Status: DC | PRN
Start: 2014-05-03 — End: 2014-05-06
  Administered 2014-05-03 – 2014-05-05 (×7): 500 mg via ORAL
  Filled 2014-05-03 (×7): qty 1

## 2014-05-03 MED ORDER — NICOTINE 21 MG/24HR TD PT24
21.0000 mg | MEDICATED_PATCH | Freq: Every day | TRANSDERMAL | Status: DC
  Administered 2014-05-04 – 2014-05-11 (×8): 21 mg via TRANSDERMAL
  Filled 2014-05-03 (×12): qty 1

## 2014-05-03 MED ORDER — ACETAMINOPHEN 325 MG PO TABS
650.0000 mg | ORAL_TABLET | Freq: Four times a day (QID) | ORAL | Status: DC | PRN
  Administered 2014-05-03 – 2014-05-06 (×5): 650 mg via ORAL
  Filled 2014-05-03 (×5): qty 2

## 2014-05-03 MED ORDER — METHOCARBAMOL 500 MG PO TABS
500.0000 mg | ORAL_TABLET | Freq: Once | ORAL | Status: AC
  Administered 2014-05-03: 500 mg via ORAL
  Filled 2014-05-03 (×2): qty 1

## 2014-05-03 MED ORDER — CLONIDINE HCL 0.1 MG PO TABS
0.1000 mg | ORAL_TABLET | ORAL | Status: AC
  Administered 2014-05-06 – 2014-05-07 (×3): 0.1 mg via ORAL
  Filled 2014-05-03 (×4): qty 1

## 2014-05-03 MED ORDER — TRAZODONE HCL 50 MG PO TABS
50.0000 mg | ORAL_TABLET | Freq: Every evening | ORAL | Status: DC | PRN
  Administered 2014-05-03 (×3): 50 mg via ORAL
  Filled 2014-05-03 (×7): qty 1

## 2014-05-03 MED ORDER — NAPROXEN 500 MG PO TABS
500.0000 mg | ORAL_TABLET | Freq: Two times a day (BID) | ORAL | Status: AC | PRN
Start: 2014-05-03 — End: 2014-05-08
  Administered 2014-05-03 – 2014-05-07 (×11): 500 mg via ORAL
  Filled 2014-05-03 (×11): qty 1

## 2014-05-03 MED ORDER — ALUM & MAG HYDROXIDE-SIMETH 200-200-20 MG/5ML PO SUSP: 30.0000 mL | ORAL | Status: DC | PRN

## 2014-05-03 MED ORDER — SERTRALINE HCL 50 MG PO TABS
50.0000 mg | ORAL_TABLET | Freq: Every day | ORAL | Status: DC
  Administered 2014-05-03 – 2014-05-04 (×2): 50 mg via ORAL
  Filled 2014-05-03 (×6): qty 1

## 2014-05-03 MED ORDER — LOPERAMIDE HCL 2 MG PO CAPS: 2.0000 mg | ORAL_CAPSULE | ORAL | Status: AC | PRN

## 2014-05-03 MED ORDER — CLONIDINE HCL 0.1 MG PO TABS
0.1000 mg | ORAL_TABLET | Freq: Every day | ORAL | Status: AC
  Administered 2014-05-08 – 2014-05-09 (×2): 0.1 mg via ORAL
  Filled 2014-05-03 (×3): qty 1

## 2014-05-03 MED ORDER — ONDANSETRON 4 MG PO TBDP
4.0000 mg | ORAL_TABLET | Freq: Four times a day (QID) | ORAL | Status: DC | PRN
  Administered 2014-05-04 – 2014-05-06 (×5): 4 mg via ORAL
  Filled 2014-05-03 (×5): qty 1

## 2014-05-03 MED ORDER — AZITHROMYCIN 250 MG PO TABS
250.0000 mg | ORAL_TABLET | Freq: Every day | ORAL | Status: AC
  Administered 2014-05-03 – 2014-05-07 (×5): 250 mg via ORAL
  Filled 2014-05-03 (×5): qty 1

## 2014-05-03 MED ORDER — CHLORDIAZEPOXIDE HCL 25 MG PO CAPS
25.0000 mg | ORAL_CAPSULE | Freq: Four times a day (QID) | ORAL | Status: DC | PRN
  Administered 2014-05-03 – 2014-05-04 (×4): 25 mg via ORAL
  Filled 2014-05-03 (×4): qty 1

## 2014-05-03 NOTE — [facility] Group Notes (Signed)
[facility] LCSW Group Therapy 05/03/2014  1:15 PM   Type of Therapy: Group Therapy  Participation Level: Did Not Attend. Patient in bed, not feeling well.   Samuella Bruin, MSW, [employer] Clinical Social Worker [facility] [phone]

## 2014-05-03 NOTE — [facility] Group Notes (Signed)
[facility] LCSW Aftercare Discharge Planning Group Note  05/03/2014  8:45 AM  Participation Quality: Did Not Attend. Patient in bed sleeping, reports that she does not feel well.  Samuella Bruin, MSW, [employer] Clinical Social Worker [facility] [phone]

## 2014-05-03 NOTE — ED Notes (Signed)
Pt. Transferred to [facility]. No acute distress noted at time of transfer. Pt. Calm and cooperative.

## 2014-05-03 NOTE — BH Assessment (Signed)
Assessment Note  Abigail [name] is a 23 y.o. female who requests detox from benzos.  Patient also reports passive suicidal ideation without a plan.  Patient reports SI due to losing custody of her child in July 2015.   Patient reports abusing drugs for the past year.  Patient reports abusing benzos and opiates.  Patient reports that her last used was yesterday.  Patient reports that she does not remember how much benzos and opiates that she used. Patient reports that she uses 3-4 benzos daily and she takes opiates every 4 hours.  Patient denies prior substance abuse detox or treatment. Patient reports withdrawal symptoms.  Patient denies seizures.  Patient reports that she has received suboxone in July 20154 but had to stop due to her Medicaid being terminated.    Patient denies prior psychiatric hospitalization.  Patient reports being diagnosed with Bipolar Disorder, PTSD, and Anxiety.  Patient reports that she is non-compliant with medication management.     Axis I: Major Depression, Recurrent severe Benzo Abuse severe  Axis II: Deferred Axis III:  Past Medical History  Diagnosis Date  . Substance abuse    Axis IV: economic problems, housing problems, occupational problems, other psychosocial or environmental problems, problems related to social environment, problems with access to health care services and problems with primary support group Axis V: 31-40 impairment in reality testing  Past Medical History:  Past Medical History  Diagnosis Date  . Substance abuse     Past Surgical History  Procedure Laterality Date  . Bladder surgery    . Tonsillectomy    . Adenoidectomy      Family History: History reviewed. No pertinent family history.  Social History:  reports that she has been smoking Cigarettes.  She has been smoking about 0.50 packs per day. She does not have any smokeless tobacco history on file. She reports that she drinks alcohol. She reports that she uses illicit  drugs (IV, Cocaine, and Marijuana).  Additional Social History:     CIWA: CIWA-Ar BP: 113/64 mmHg Pulse Rate: 93 COWS: Clinical Opiate Withdrawal Scale (COWS) Resting Pulse Rate: Pulse Rate 101-120 Sweating: Subjective report of chills or flushing Restlessness: Able to sit still Pupil Size: Pupils pinned or normal size for room light Bone or Joint Aches: Mild diffuse discomfort Runny Nose or Tearing: Nose running or tearing GI Upset: No GI symptoms Tremor: No tremor Yawning: No yawning Anxiety or Irritability: Patient reports increasing irritability or anxiousness Gooseflesh Skin: Skin is smooth COWS Total Score: 7  Allergies:  Allergies  Allergen Reactions  . Latex Swelling  . Penicillins Other (See Comments)    REACTION: Childhood allergy/unknown reaction    Home Medications:  (Not in a hospital admission)  OB/GYN Status:  Patient's last menstrual period was 04/01/2014.  General Assessment Data Location of Assessment: [facility] Is this a Tele or Face-to-Face Assessment?: Face-to-Face Is this an Initial Assessment or a Re-assessment for this encounter?: Initial Assessment Living Arrangements: Parent Can pt return to current living arrangement?: Yes Admission Status: Voluntary Is patient capable of signing voluntary admission?: Yes Transfer from: Home Referral Source: Self/Family/Friend  Medical Screening Exam [facility] Walk-in ONLY) Medical Exam completed: Yes  [facility] Crisis Care Plan Living Arrangements: Parent Name of Psychiatrist: None Reported Name of Therapist: None Reported  Education Status Is patient currently in school?: No Current Grade: NA Highest grade of school patient has completed: NA Name of school: NA Contact person: NA  Risk to self with the past 6 months Suicidal  Ideation: Yes-Currently Present Suicidal Intent: No Is patient at risk for suicide?: Yes Suicidal Plan?: No Access to Means: No What has been your use of drugs/alcohol within the last  12 months?: Benzos, Opiates Previous Attempts/Gestures: No How many times?: 0 Other Self Harm Risks: NA Triggers for Past Attempts:  (NA) Intentional Self Injurious Behavior: None Family Suicide History: No Recent stressful life event(s): Job Loss;Financial Problems;Loss (Comment);Conflict (Comment);Trauma (Comment) (Lost custody of her daughter in July) Persecutory voices/beliefs?: No Depression: Yes Depression Symptoms: Loss of interest in usual pleasures;Feeling worthless/self pity;Feeling angry/irritable;Isolating;Guilt;Fatigue;Despondent;Tearfulness Substance abuse history and/or treatment for substance abuse?: Yes Suicide prevention information given to non-admitted patients: Yes  Risk to Others within the past 6 months Homicidal Ideation: No Thoughts of Harm to Others: No Current Homicidal Intent: No Current Homicidal Plan: No Access to Homicidal Means: No Identified Victim: NA History of harm to others?: No Assessment of Violence: None Noted Violent Behavior Description: None Reported Does patient have access to weapons?: No Criminal Charges Pending?: No Does patient have a court date: No  Psychosis Hallucinations: None noted Delusions: None noted  Mental Status Report Appear/Hygiene: Disheveled;In scrubs Eye Contact: Fair Motor Activity: Freedom of movement;Restlessness Speech: Logical/coherent Level of Consciousness: Alert Mood: Depressed;Anxious Affect: Anxious Anxiety Level: Minimal Thought Processes: Coherent;Relevant Judgement: Unimpaired Orientation: Person;Place;Time;Situation Obsessive Compulsive Thoughts/Behaviors: None  Cognitive Functioning Concentration: Decreased Memory: Recent Intact;Remote Intact IQ: Average Insight: Fair Impulse Control: Poor Appetite: Fair Weight Loss: 0 Weight Gain: 0 Sleep: Decreased Total Hours of Sleep: 5 Vegetative Symptoms: Decreased grooming  ADLScreening [facility] Assessment Services) Patient's cognitive ability  adequate to safely complete daily activities?: Yes Patient able to express need for assistance with ADLs?: Yes Independently performs ADLs?: Yes (appropriate for developmental age)  Prior Inpatient Therapy Prior Inpatient Therapy: No Prior Therapy Dates: NA Prior Therapy Facilty/Provider(s): NA Reason for Treatment: NA  Prior Outpatient Therapy Prior Outpatient Therapy: Yes Prior Therapy Dates: May 2015 Prior Therapy Facilty/Provider(s): [facility]  Reason for Treatment: Med Mgt and OPT, Suboxone Treatment   ADL Screening (condition at time of admission) Patient's cognitive ability adequate to safely complete daily activities?: Yes Patient able to express need for assistance with ADLs?: Yes Independently performs ADLs?: Yes (appropriate for developmental age)         Values / Beliefs Cultural Requests During Hospitalization: None Spiritual Requests During Hospitalization: None   Advance Directives (For Healthcare) Does patient have an advance directive?: No Would patient like information on creating an advanced directive?: No - patient declined information    Additional Information 1:1 In Past 12 Months?: No CIRT Risk: No Elopement Risk: No Does patient have medical clearance?: Yes     Disposition:  Disposition Initial Assessment Completed for this Encounter: Yes Disposition of Patient: Inpatient treatment program Type of inpatient treatment program: Adult (Per Karleen Hampshire, PA patient meets criteria for inpt hosp )  On Site Evaluation by:   Reviewed with Physician:    Phillip Heal LaVerne 05/03/2014 12:25 AM

## 2014-05-03 NOTE — Tx Team (Signed)
Initial Interdisciplinary Treatment Plan   PATIENT STRESSORS: Loss of Son due to drug use in May 2015 Marital or family conflict Medication change or noncompliance Substance abuse Traumatic event   PROBLEM LIST: Problem List/Patient Goals Date to be addressed Date deferred Reason deferred Estimated date of resolution  "Being clean and sober and stronger" 05/03/14     "To get better relationships with my family" 05/03/14           depression 05/03/14     Substance abuse 05/03/14     Increased risk suicide 05/03/14                        DISCHARGE CRITERIA:  Ability to meet basic life and health needs Adequate post-discharge living arrangements Improved stabilization in mood, thinking, and/or behavior Medical problems require only outpatient monitoring Motivation to continue treatment in a less acute level of care Need for constant or close observation no longer present Reduction of life-threatening or endangering symptoms to within safe limits Safe-care adequate arrangements made Verbal commitment to aftercare and medication compliance Withdrawal symptoms are absent or subacute and managed without 24-hour nursing intervention  PRELIMINARY DISCHARGE PLAN: Attend PHP/IOP Attend 12-step recovery group Outpatient therapy Participate in family therapy Placement in alternative living arrangements  PATIENT/FAMIILY INVOLVEMENT: This treatment plan has been presented to and reviewed with the patient, Abigail [name], and/or family member.  The patient and family have been given the opportunity to ask questions and make suggestions.  Abigail [name] 05/03/2014, 2:13 AM

## 2014-05-03 NOTE — Progress Notes (Signed)
Patient ID: Abigail [name], female   DOB: 1991-06-30, 23 y.o.   MRN: [id number] She has been in bed majoriaty of the day saying she was tired and was feeling bad. She requested and received prn this AM for pain that was helpful. She did not eat AM meal and part of lunch. She did not fill out her self inventory today. She has not c/o cough or SOB today. She has been encouraged to get up and interact more this PM.
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
## 2014-05-03 NOTE — Progress Notes (Signed)
Adult Psychoeducational Group Note  Date:  05/03/2014 Time:  10:00 am  Group Topic/Focus:  Wellness Toolbox:   The focus of this group is to discuss various aspects of wellness, balancing those aspects and exploring ways to increase the ability to experience wellness.  Patients will create a wellness toolbox for use upon discharge.  Participation Level:  Did Not Attend   Modena Nunnery 05/03/2014, 10:19 AM
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
## 2014-05-03 NOTE — Clinical Social Work Note (Signed)
CSW attempted to complete PSA but Patient in bed sleeping, reports that she does not feel well. CSW will attempt to complete PSA at later time.  Samuella Bruin, MSW, [employer] Clinical Social Worker [facility] [phone]

## 2014-05-03 NOTE — Progress Notes (Signed)
D: Patient in the hallway on approach.  Patient states she is withdrawing and states she continues to have body aches.  Patient states, I feel really bad and give me whatever medicine I can get."    Patient denies SI/HI and denies AVH. A: Staff to monitor Q 15 mins for safety.  Encouragement and support offered.  Scheduled medications administered per orders.  Tylenol administered prn for pain, Librium administered prn for anxiety. R: Patient remains safe on the unit.  Patient attended group tonight.  Patient visible on the unit and interacting with peers.  Patient taking administered medications

## 2014-05-04 DIAGNOSIS — F3289 Other specified depressive episodes: Secondary | ICD-10-CM

## 2014-05-04 DIAGNOSIS — F19939 Other psychoactive substance use, unspecified with withdrawal, unspecified: Secondary | ICD-10-CM

## 2014-05-04 DIAGNOSIS — F329 Major depressive disorder, single episode, unspecified: Secondary | ICD-10-CM

## 2014-05-04 DIAGNOSIS — F1120 Opioid dependence, uncomplicated: Secondary | ICD-10-CM

## 2014-05-04 LAB — HEPATITIS B SURFACE ANTIGEN: Hepatitis B Surface Ag: NEGATIVE

## 2014-05-04 LAB — HIV ANTIBODY (ROUTINE TESTING W REFLEX): HIV 1&2 Ab, 4th Generation: NONREACTIVE

## 2014-05-04 LAB — HEPATITIS C ANTIBODY: HCV Ab: NEGATIVE

## 2014-05-04 LAB — T3: T3 TOTAL: 84.1 ng/dL (ref 80.0–204.0)

## 2014-05-04 LAB — T4, FREE: Free T4: 0.87 ng/dL (ref 0.80–1.80)

## 2014-05-04 MED ORDER — CHLORDIAZEPOXIDE HCL 25 MG PO CAPS
25.0000 mg | ORAL_CAPSULE | Freq: Four times a day (QID) | ORAL | Status: DC | PRN
  Administered 2014-05-04 – 2014-05-09 (×20): 25 mg via ORAL
  Filled 2014-05-04 (×20): qty 1

## 2014-05-04 MED ORDER — TRAZODONE HCL 100 MG PO TABS
100.0000 mg | ORAL_TABLET | Freq: Every evening | ORAL | Status: DC | PRN
  Administered 2014-05-04 – 2014-05-05 (×3): 100 mg via ORAL
  Filled 2014-05-04 (×3): qty 1

## 2014-05-04 MED ORDER — SERTRALINE HCL 25 MG PO TABS
75.0000 mg | ORAL_TABLET | Freq: Every day | ORAL | Status: DC
  Administered 2014-05-05: 75 mg via ORAL
  Filled 2014-05-04 (×4): qty 3

## 2014-05-04 NOTE — Clinical Social Work Note (Signed)
Letter regarding court appearance faxed to Lamar Sprinkles of Court per patient request.   Samuella Bruin, MSW, [facility] Clinical Social Worker [facility] [phone]

## 2014-05-04 NOTE — [facility] Suicide Risk Assessment (Signed)
[facility] INPATIENT:  Family/Significant Other Suicide Prevention Education  Suicide Prevention Education:  Education Completed; Brother Ailen Strauch [phone],  (name of family member/significant other) has been identified by the patient as the family member/significant other with whom the patient will be residing, and identified as the person(s) who will aid the patient in the event of a mental health crisis (suicidal ideations/suicide attempt).  With written consent from the patient, the family member/significant other has been provided the following suicide prevention education, prior to the and/or following the discharge of the patient.  The suicide prevention education provided includes the following:  Suicide risk factors  Suicide prevention and interventions  National Suicide Hotline telephone number  [facility] assessment telephone number  [facility] Emergency Assistance 911  [facility] and/or Residential Mobile Crisis Unit telephone number  Request made of family/significant other to:  Remove weapons (e.g., guns, rifles, knives), all items previously/currently identified as safety concern.    Remove drugs/medications (over-the-counter, prescriptions, illicit drugs), all items previously/currently identified as a safety concern.  The family member/significant other verbalizes understanding of the suicide prevention education information provided.  The family member/significant other agrees to remove the items of safety concern listed above.  Lezly Rumpf, [place] 05/04/2014, 5:15 PM

## 2014-05-04 NOTE — [facility] Group Notes (Signed)
[facility] LCSW Group Therapy 05/04/2014 1:15 PM Type of Therapy: Group Therapy Participation Level: Active  Participation Quality: Attentive, Sharing and Supportive  Affect: Depressed and Flat  Cognitive: Alert and Oriented  Insight: Developing/Improving and Engaged  Engagement in Therapy: Developing/Improving and Engaged  Modes of Intervention: Activity, Clarification, Confrontation, Discussion, Education, Exploration, Limit-setting, Orientation, Problem-solving, Rapport Building, Reality Testing, Socialization and Support  Summary of Progress/Problems: Patient was attentive and engaged with speaker from [employer]. Patient was attentive to speaker while they shared their story of dealing with mental health and overcoming it. Patient expressed interest in their programs and services and received information on their agency. Patient processed ways they can relate to the speaker.   Culver Feighner, MSW, LCSWA Clinical Social Worker [facility] [phone]   

## 2014-05-04 NOTE — Progress Notes (Addendum)
[facility] MD Progress Note  05/04/2014 2:52 PM Abigail [name]  MRN:  [id number] Subjective:  Describes ongoing withdrawal symptoms, primarily nausea, abdominal cramps, aches, lower extremity myalgias, rhinorrhea , yawning. Objective: as above, patient describes ongoing opiate withdrawal symptoms. She is also experiencing cravings for opiates. She is still dysphoric and depressed, but today seems better related, more communicative. She has good insight into her opiate dependence, she is motivated in sobriety, and she discussed factors that she feels perpetuate her use/ act as triggers, such as her poor relationship with her mother, who actively uses drugs . She is hoping her older brother, who is sober x 9 years and who currently has custody of her son, will allow her to live with him after discharge. Of note, patient made allusions  to high risk sexual behaviors related to drug use/ obtaining drugs. I offered to order  VD work up, but she declined. HIV , Hep B and C are negative.T4 and FT3 on lower range of normal   We reviewed results with patient. No disruptive behaviors on unit.  Tolerating medications well. .   Diagnosis:  Opiate Dependene, Opiate Withdrawal, Depression NOS   ADL's: improving  Sleep: fair , states she wakes up often , in spite of Trazodone, which has been partially helpful and well tolerated.   Appetite:  Fair   Suicidal Ideation:  Denies suicidal ideations Homicidal Ideation:  Denies  AEB (as evidenced by):  Psychiatric Specialty Exam: Physical Exam  Review of Systems  Constitutional: Positive for malaise/fatigue. Negative for fever.  Respiratory: Positive for cough. Negative for shortness of breath.   Cardiovascular: Negative for chest pain.  Gastrointestinal: Positive for nausea. Negative for vomiting and diarrhea.  Genitourinary: Negative.   Musculoskeletal: Positive for myalgias.  Skin: Negative for rash.  Psychiatric/Behavioral: Positive for depression  and substance abuse. Negative for suicidal ideas.    Blood pressure 96/53, pulse 89, temperature 98 F (36.7 C), temperature source Oral, resp. rate 20, height  (1.6 m), weight 65.318 kg (144 lb), last menstrual period 04/01/2014.Body mass index is 25.51 kg/(m^2).  General Appearance: Fairly Groomed  Patent attorney::  Good  Speech:  Clear and Coherent  Volume:  Normal  Mood:  Depressed  Affect:  Constricted and but reactive, does smile at times appropriately  Thought Process:  Goal Directed and Linear  Orientation:  Other:  fully alert and attentive  Thought Content:  no hallucinations, no delusions  Suicidal Thoughts:  No- at this time denies any suicidal or homicidal ideations  Homicidal Thoughts:  No  Memory:  recent and remote grossly intact  Judgement:  Fair  Insight:  Fair  Psychomotor Activity:  Normal  Concentration:  Good  Recall:  Good  Fund of Knowledge:Good  Language: Good  Akathisia:  Negative  Handed:  Right  AIMS (if indicated):     Assets:  Communication Skills Desire for Improvement Resilience  Sleep:  Number of Hours: 6.5   Musculoskeletal: Strength & Muscle Tone: within normal limits Gait & Station: normal Patient leans: N/A  Current Medications: Current Facility-Administered Medications  Medication Dose Route Frequency Provider Last Rate Last Dose  . acetaminophen (TYLENOL) tablet 650 mg  650 mg Oral Q6H PRN Kerry Hough, PA-C   650 mg at 05/03/14 2111  . alum & mag hydroxide-simeth (MAALOX/MYLANTA) 200-200-20 MG/5ML suspension 30 mL  30 mL Oral Q4H PRN Kerry Hough, PA-C      . azithromycin (ZITHROMAX) tablet 250 mg  250 mg Oral Daily Karleen Hampshire  E Simon, PA-C   250 mg at 05/04/14 0805  . chlordiazePOXIDE (LIBRIUM) capsule 25 mg  25 mg Oral QID PRN Rachael Fee, MD   25 mg at 05/04/14 1156  . cloNIDine (CATAPRES) tablet 0.1 mg  0.1 mg Oral QID Kerry Hough, PA-C   0.1 mg at 05/04/14 2956   Followed by  . [START ON 05/05/2014] cloNIDine  (CATAPRES) tablet 0.1 mg  0.1 mg Oral BH-qamhs Spencer E Simon, PA-C       Followed by  . [START ON 05/08/2014] cloNIDine (CATAPRES) tablet 0.1 mg  0.1 mg Oral QAC breakfast Kerry Hough, PA-C      . dicyclomine (BENTYL) tablet 20 mg  20 mg Oral Q6H PRN Kerry Hough, PA-C   20 mg at 05/04/14 1156  . hydrOXYzine (ATARAX/VISTARIL) tablet 25 mg  25 mg Oral Q6H PRN Kerry Hough, PA-C   25 mg at 05/03/14 1956  . loperamide (IMODIUM) capsule 2-4 mg  2-4 mg Oral PRN Kerry Hough, PA-C      . magnesium hydroxide (MILK OF MAGNESIA) suspension 30 mL  30 mL Oral Daily PRN Kerry Hough, PA-C      . methocarbamol (ROBAXIN) tablet 500 mg  500 mg Oral Q8H PRN Kerry Hough, PA-C   500 mg at 05/04/14 2130  . naproxen (NAPROSYN) tablet 500 mg  500 mg Oral BID PRN Kerry Hough, PA-C   500 mg at 05/04/14 8657  . nicotine (NICODERM CQ - dosed in mg/24 hours) patch 21 mg  21 mg Transdermal Q0600 Rachael Fee, MD   21 mg at 05/04/14 8469  . ondansetron (ZOFRAN-ODT) disintegrating tablet 4 mg  4 mg Oral Q6H PRN Kerry Hough, PA-C   4 mg at 05/04/14 1304  . sertraline (ZOLOFT) tablet 50 mg  50 mg Oral Daily Lindwood Qua, NP   50 mg at 05/04/14 0805  . traZODone (DESYREL) tablet 50 mg  50 mg Oral QHS,MR X 1 Kerry Hough, PA-C   50 mg at 05/03/14 2158    Lab Results:  Results for orders placed during the hospital encounter of 05/03/14 (from the past 48 hour(s))  TSH     Status: Abnormal   Collection Time    05/03/14  6:45 AM      Result Value Ref Range   TSH 0.244 (*) 0.350 - 4.500 uIU/mL   Comment: Performed at [facility]  HEPATITIS C ANTIBODY     Status: None   Collection Time    05/04/14  6:32 AM      Result Value Ref Range   HCV Ab NEGATIVE  NEGATIVE   Comment: (NOTE)     Effective June 26, 2014, Hepatitis C Antibody (test code 62952)     will be revised to automatically reflex to the Hepatitis C Viral RNA,     Quantitative, Real-Time PCR assay if the antibody  screening result is     Reactive. This action is being taken to ensure that the CDC/USPSTF     recommended HCV diagnostic algorithm with the appropriate test reflex     needed for accurate interpretation is followed.     As of June 26, 2014 the name of the test code 84132 will be     Hepatitis C Antibody with Reflex to HCV RNA, Quantitative, Real-Time     PCR. This change will also be reflected in standard and custom     profiles that currently include  test code 16109.     Performed at [facility]  HEPATITIS B SURFACE ANTIGEN     Status: None   Collection Time    05/04/14  6:32 AM      Result Value Ref Range   Hepatitis B Surface Ag NEGATIVE  NEGATIVE   Comment: Performed at [facility]  HIV ANTIBODY (ROUTINE TESTING)     Status: None   Collection Time    05/04/14  6:32 AM      Result Value Ref Range   HIV 1&2 Ab, 4th Generation NONREACTIVE  NONREACTIVE   Comment: (NOTE)     A NONREACTIVE HIV Ag/Ab result does not exclude HIV infection since     the time frame for seroconversion is variable. If acute HIV infection     is suspected, a HIV-1 RNA Qualitative TMA test is recommended.     HIV-1/2 Antibody Diff         Not indicated.     HIV-1 RNA, Qual TMA           Not indicated.     PLEASE NOTE: This information has been disclosed to you from records     whose confidentiality may be protected by state law. If your state     requires such protection, then the state law prohibits you from making     any further disclosure of the information without the specific written     consent of the person to whom it pertains, or as otherwise permitted     by law. A general authorization for the release of medical or other     information is NOT sufficient for this purpose.     The performance of this assay has not been clinically validated in     patients less than 109 years old.     Performed at [facility]  T4, FREE     Status: None   Collection Time    05/04/14   6:32 AM      Result Value Ref Range   Free T4 0.87  0.80 - 1.80 ng/dL   Comment: Performed at [facility]  T3     Status: None   Collection Time    05/04/14  6:32 AM      Result Value Ref Range   T3, Total 84.1  80.0 - 204.0 ng/dl   Comment: Performed at [facility]    Physical Findings: AIMS: Facial and Oral Movements Muscles of Facial Expression: None, normal Lips and Perioral Area: None, normal Jaw: None, normal Tongue: None, normal,Extremity Movements Upper (arms, wrists, hands, fingers): None, normal Lower (legs, knees, ankles, toes): None, normal, Trunk Movements Neck, shoulders, hips: None, normal, Overall Severity Severity of abnormal movements (highest score from questions above): None, normal Incapacitation due to abnormal movements: None, normal Patient's awareness of abnormal movements (rate only patient's report): No Awareness, Dental Status Current problems with teeth and/or dentures?: No Does patient usually wear dentures?: No  CIWA:  CIWA-Ar Total: 5 COWS:  COWS Total Score: 6  Assessment:  At this time patient continues to present with opiate withdrawal symptoms, and remains depressed and dysphoric, although her affect is more reactive and more communicative. She is tolerating medications well. She is motivated in recovery, but at this time ambivalent about possibility of going to a Rehab setting after discharge.  Treatment Plan Summary: Daily contact with patient to assess and evaluate symptoms and progress in treatment Medication management See below  Plan:  Continue Detox with Clonidine as per opiate detox protocol. Continue  Zoloft at 44 mgrs a day Continue Trazodone at 100  mgrs QHS Patient is requesting a meeting with her and her brother, who is her closest support , in order to determine if she can go  Live with him after discharge. Family meeting tentatively scheduled for tomorrow AM.  Medical Decision Making Problem Points:   Established problem, stable/improving (1), Review of last therapy session (1) and Review of psycho-social stressors (1) Data Points:  Review or order clinical lab tests (1) Review of medication regiment & side effects (2) Review of new medications or change in dosage (2)  I certify that inpatient services furnished can reasonably be expected to improve the patient's condition.   COBOS, FERNANDO 05/04/2014, 2:52 PM

## 2014-05-04 NOTE — Clinical Social Work Note (Signed)
CSW attempted to complete PSA with patient. Patient in bed, difficult to arouse. States that she wants to sleep. CSW will attempt to complete PSA at a later time.  Samuella Bruin, MSW, [employer] Clinical Social Worker [facility] [phone]

## 2014-05-04 NOTE — [facility] Group Notes (Signed)
0900 nursing orientation group.    The focus of this group is to educate the patient on the purpose and policies of crisis stabilization and provide a format to answer questions about their admission.  The group details unit policies and expectations of patients while admitted.  Pt did not attend she was in her bed asleep.  
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
## 2014-05-04 NOTE — Clinical Social Work Note (Signed)
Referral faxed to [facility] with patient permission.  Samuella Bruin, MSW, [employer] Clinical Social Worker [facility] [phone]

## 2014-05-04 NOTE — Progress Notes (Signed)
Patient did attend the evening karaoke group. Pt was engaged, supportive, and participated by singing a song.  

## 2014-05-04 NOTE — Progress Notes (Signed)
Patient ID: Abigail [name], female   DOB: 1991-01-26, 23 y.o.   MRN: [id number] She was in bed most of the AM up for lunch and to groups.Has requested and received prn medication for withdrawal symptoms. Clonidine held at 12 noon b/p low.  She was encouraged to drink more liquids.

## 2014-05-05 MED ORDER — SERTRALINE HCL 100 MG PO TABS
100.0000 mg | ORAL_TABLET | Freq: Every day | ORAL | Status: DC
  Administered 2014-05-06 – 2014-05-11 (×6): 100 mg via ORAL
  Filled 2014-05-05 (×2): qty 1
  Filled 2014-05-05: qty 14
  Filled 2014-05-05: qty 1
  Filled 2014-05-05 (×2): qty 14
  Filled 2014-05-05 (×2): qty 1

## 2014-05-05 NOTE — Progress Notes (Signed)
D Sheera has had a hard time today with her detox. . She has requested and been given prn's at 0940 ( c/o pain and nausea), bentyl at 1300 ( c/o stomach cramping ) and her daily zoloft has been increased to 50 mg po qd.  A She has spent most of her day in her bed, saying she is " too sick " to go to her groups. She completed her self assessment sheet and on it she deneid SI within the past 24 hrs and she rated her depression, hopelessness and anxiety "01/13/09", respectively.   R Safety is in place and poc moves forward

## 2014-05-05 NOTE — Tx Team (Signed)
Interdisciplinary Treatment Plan Update (Adult)   Date: 05/05/2014  Time Reviewed:9:30 AM  Progress in Treatment:  Attending groups: Intermittently Participating in groups:  When she attends Taking medication as prescribed: Yes  Tolerating medication: Yes  Family/Significant othe contact made: SPE completed with pt's brother.   Patient understands diagnosis: Yes, AEB seeking treatment for Opioid detox, SI, mood stabilization, and medication management.  Discussing patient identified problems/goals with staff: Yes  Medical problems stabilized or resolved: Yes  Denies suicidal/homicidal ideation: Yes during group/self report.  Patient has not harmed self or Others: Yes  New problem(s) identified: Pt reporting continued withdrawal symptoms including crampy, cold sweats, aches/pains, and increased anxiety. Poor sleep.   Discharge Plan or Barriers: Pt requesting inpatient treatment-referral sent to [facility] on 9/24. CSW assessing for alternative o/p options is not accepted. She is able to live with brother if waiting for [facility] bed. Able to stay with pt's mother.  Additional comments: Abigail [name] is a 23 year old caucasian female with a long history of polysubstance abuse. She was initially seen at [facility] and accepted and transferred to [facility] for further evaluation and treatment. She verbalized wanting detox. She had been on long term abuse of opioids from requiring pain medications for her back since she was a teenager. An MRI was obtained on this admission and findings were negative. Abigail [name] states that she buys narcotics off the street. She reports that she is homeless at the present time. During the interview, Abigail [name] appeared tired and she was not very talkative. She did share that she lost custody of her son to her older brother who advised her to seek treatment. This also caused her to try IVC drug use with her boyfriend who is now incarcerated. Furthermore, she reports having poor family  support system and that her parents were both drug users and she has been depressed since she was 24 years old. Reports that sertraline worked well for her but she was was her meds for 2 weeks. Abigail [name] in [place], [place] was called to verify frequency and dosing. She will be restarted on verified dose of 50 mg QD.  Reason for Continuation of Hospitalization: Mood stabilization Opioid detox-clonidine taper Medication management  Estimated length of stay: 3-4 days (tentative d/c Monday) For review of initial/current patient goals, please see plan of care.  Attendees:  Patient:    Family:    Physician: Dr. Rosezella Rumpf MD 05/05/2014 9:34 AM   Nursing: Chandra Batch. PA 05/05/2014 9:34 AM    Clinical Social Worker Chrystine Frogge Smart, LCSWA  05/05/2014 9:34 AM   Other:    Other: Darden Dates Nurse CM 05/05/2014 9:35 AM   Other: Tomasita Morrow, Community Care Coordinator  05/05/2014 9:35 AM   Other:    Scribe for Treatment Team:  Trula Slade LCSWA  05/05/2014 9:35 AM   Pt and CSW reviewed pt's identified goals and treatment plan. Pt verbalized understanding and agreed to treatment plan.  [employer], LCSWA 05/05/2014 9:36 AM

## 2014-05-05 NOTE — BHH Group Notes (Signed)
[facility] LCSW Aftercare Discharge Planning Group Note   05/05/2014 9:43 AM  Participation Quality:  Did not attend group - patient in bed.  Shamiyah Ngu, [place]

## 2014-05-05 NOTE — Progress Notes (Signed)
Adult Psychoeducational Group Note  Date:  05/05/2014 Time:  11:11 AM  Group Topic/Focus:  Relapse Prevention Planning:   The focus of this group is to define relapse and discuss the need for planning to combat relapse.  Participation Level:  Active  Participation Quality:  Appropriate, Attentive and Sharing  Affect:  Appropriate  Cognitive:  Alert and Appropriate  Insight: Appropriate and Good  Engagement in Group:  Engaged and Improving  Modes of Intervention:  Discussion and Education  Additional Comments:  Pt discussed reason for relapse prevention plan which included her son. Pt was able to identify a trigger of mom and the fact that she does drugs. Pt states that her brother is more "tough love" but does show support as he paid off her drug debt if she agreed to come to [facility]. Pt states she thinks he did this mostly because of her son.   Dalia Heading 05/05/2014, 11:11 AM

## 2014-05-05 NOTE — Progress Notes (Signed)
D   Pt complains of moderate withdrawal symptoms which include cramping ,cold sweats, aches and pains and increased anxiety    Pt also said she could not sleep   She is appropriate and interacts well with others   She denies suicidal ideation at present A   Verbal support given   Medications administered according to protocal   Pt educated on withdrawal symptoms and medications for same R   Pt verbalized understanding and is presently safe

## 2014-05-05 NOTE — Progress Notes (Signed)
Writer spoke with patient 1:1 and she reports that her day has been pretty rough d/t her experiencing withdrawal symptoms.Patient informs Clinical research associate of the next times that her prn's are available. Patient has been observed up in the dayroom talking with her visitors earlier and attended group this evening. Patient denies si/hi/a/v hallucinations. She reports that she is currently uncertain of her goal. She reports that she is not sure that she wants to go to a 30 day treatment facility because she fills that she has strong will power and will not relapse. She is hopeful that her brother will let her stay with him but right now he does not agree to her living with him. Support and encouragement given, safety maintained on unit with 15 min checks.

## 2014-05-05 NOTE — B[facility] Group Notes (Signed)
[facility] LCSW Group Therapy  Feelings Around Relapse 1:15 -2:30        05/05/2014   Type of Therapy:  Group Therapy  Participation Level:  Appropriate  Participation Quality:  Appropriate  Affect:  Depressed  Cognitive:   Appropriate  Insight:  Developing/Improving  Engagement in Therapy: Developing/Improving  Modes of Intervention:  Discussion Exploration Problem-Solving Supportive  Summary of Progress/Problems:  The topic for today was feelings around relapse.  Patient processed feelings toward relapse and was able to relate to peers. She shared she would be hanging out with friends and using drugs.  Patient shared she really wants to get her life together so that she can get her son back who is living with her brother. Patient identified coping skills that can be used to prevent a relapse including going into a residential program.   Wynn Banker 05/05/2014

## 2014-05-05 NOTE — B[facility] Group Notes (Signed)
Adult Psychoeducational Group Note  Date:  05/05/2014 Time:  9:19 PM  Group Topic/Focus:  Wrap-Up Group:   The focus of this group is to help patients review their daily goal of treatment and discuss progress on daily workbooks.  Participation Level:  Active  Participation Quality:  Appropriate  Affect: Appropriate  Cognitive:  Appropriate  Insight: Good  Engagement in Group:  Engaged  Modes of Intervention:  Discussion  Additional Comments:  Abigail [name] stated her goal was to make it through the day and not sign herself out.  She stated she played [employer].  In addition, she expressed the doctor told her she would know her discharge plans Monday.  Caroll Rancher A 05/05/2014, 9:19 PM

## 2014-05-05 NOTE — Progress Notes (Signed)
Patient ID: Abigail [name], female   DOB: [birth year], 23 y.o.   MRN: [id number] [facility] MD Progress Note  05/05/2014 1:49 PM Abigail [name]  MRN:  [id number] Subjective:  Patient describes increased symptoms of opiate withdrawal today, which she states is typical for her as compared to prior withdrawals . States " I usually have a really bad couple of days right about at this time, and then I get better". Describes increased abdominal cramping, significant nausea, although no vomiting, and increased muscular aches. Objective: I have discussed case with treatment team. Patient , as above, continues to report symptoms of opiate withdrawal, and today increased nausea and abdominal discomfort in particular. She does respond to Bentyl and Zofran PRNs, but symptoms recur after a few hours. She has been able to eat her meals and drink fluids without vomiting and is not dehydrated. She is going to groups and is participative in milieu, and interacting appropriately with staff and peers. She had requested a family meeting with her brother today, but states " he couldn't come , he had something else he had to do". She continues to hope that he will allow her to stay with him after discharge, and states he is closest family member she has and that he has been sober for several years. She does not want to go to an inpatient Rehab after discharge, which he has requested her to do. i have encouraged her to consider this option, as it would likely help her in maintaining sobriety and working on early recovery. She is tolerating medications well and is not endorsing side effects at the present time. Tolerating medications well. .   Diagnosis:  Opiate Dependene, Opiate Withdrawal, Depression NOS   ADL's: improving  Sleep: fair , but improving   Appetite:   Fair   Suicidal Ideation:  Denies suicidal ideations Homicidal Ideation:  Denies  AEB (as evidenced by):  Psychiatric Specialty Exam: Physical Exam   Review of Systems  Constitutional: Positive for malaise/fatigue. Negative for fever.  Respiratory: Positive for cough. Negative for shortness of breath.   Cardiovascular: Negative for chest pain.  Gastrointestinal: Positive for nausea. Negative for vomiting and diarrhea.  Genitourinary: Negative.   Musculoskeletal: Positive for myalgias.  Skin: Negative for rash.  Psychiatric/Behavioral: Positive for depression and substance abuse. Negative for suicidal ideas.    Blood pressure 112/62, pulse 61, temperature 97.9 F (36.6 C), temperature source Oral, resp. rate 18, height  (1.6 m), weight 65.318 kg (144 lb), last menstrual period 04/01/2014.Body mass index is 25.51 kg/(m^2).  General Appearance: grooming improved   Eye Contact::  Good  Speech:  Clear and Coherent  Volume:  Normal  Mood:  Depressed and but improved compared to admission  Affect:  remains vaguely constricted, but does smile often and appropriately  Thought Process:  Goal Directed and Linear  Orientation:  Other:  fully alert and attentive  Thought Content:  no hallucinations, no delusions  Suicidal Thoughts:  No- at this time denies any suicidal or homicidal ideations  Homicidal Thoughts:  No  Memory:  recent and remote grossly intact  Judgement:  Fair  Insight:  Fair  Psychomotor Activity:  Normal  Concentration:  Good  Recall:  Good  Fund of Knowledge:Good  Language: Good  Akathisia:  Negative  Handed:  Right  AIMS (if indicated):     Assets:  Communication Skills Desire for Improvement Resilience  Sleep:  Number of Hours: 5.25   Musculoskeletal: Strength & Muscle Tone: within normal limits  Gait & Station: normal Patient leans: N/A  Current Medications: Current Facility-Administered Medications  Medication Dose Route Frequency Provider Last Rate Last Dose  . acetaminophen (TYLENOL) tablet 650 mg  650 mg Oral Q6H PRN Kerry Hough, PA-C   650 mg at 05/04/14 2219  . alum & mag hydroxide-simeth  (MAALOX/MYLANTA) 200-200-20 MG/5ML suspension 30 mL  30 mL Oral Q4H PRN Kerry Hough, PA-C      . azithromycin [facility]) tablet 250 mg  250 mg Oral Daily Kerry Hough, PA-C   250 mg at 05/05/14 0941  . chlordiazePOXIDE (LIBRIUM) capsule 25 mg  25 mg Oral QID PRN Rachael Fee, MD   25 mg at 05/05/14 1253  . cloNIDine (CATAPRES) tablet 0.1 mg  0.1 mg Oral QID Kerry Hough, PA-C   0.1 mg at 05/05/14 1250   Followed by  . cloNIDine (CATAPRES) tablet 0.1 mg  0.1 mg Oral BH-qamhs Kerry Hough, PA-C       Followed by  . [START ON 05/08/2014] cloNIDine (CATAPRES) tablet 0.1 mg  0.1 mg Oral QAC breakfast Kerry Hough, PA-C      . dicyclomine (BENTYL) tablet 20 mg  20 mg Oral Q6H PRN Kerry Hough, PA-C   20 mg at 05/05/14 1251  . hydrOXYzine (ATARAX/VISTARIL) tablet 25 mg  25 mg Oral Q6H PRN Kerry Hough, PA-C   25 mg at 05/05/14 0711  . loperamide (IMODIUM) capsule 2-4 mg  2-4 mg Oral PRN Kerry Hough, PA-C      . magnesium hydroxide (MILK OF MAGNESIA) suspension 30 mL  30 mL Oral Daily PRN Kerry Hough, PA-C      . methocarbamol (ROBAXIN) tablet 500 mg  500 mg Oral Q8H PRN Kerry Hough, PA-C   500 mg at 05/05/14 4098  . naproxen (NAPROSYN) tablet 500 mg  500 mg Oral BID PRN Kerry Hough, PA-C   500 mg at 05/05/14 0710  . nicotine (NICODERM CQ - dosed in mg/24 hours) patch 21 mg  21 mg Transdermal Q0600 Rachael Fee, MD   21 mg at 05/05/14 0711  . ondansetron (ZOFRAN-ODT) disintegrating tablet 4 mg  4 mg Oral Q6H PRN Kerry Hough, PA-C   4 mg at 05/05/14 0940  . sertraline (ZOLOFT) tablet 75 mg  75 mg Oral Daily Nehemiah Massed, MD   75 mg at 05/05/14 0940  . traZODone (DESYREL) tablet 100 mg  100 mg Oral QHS PRN Nehemiah Massed, MD   100 mg at 05/04/14 2337    Lab Results:  Results for orders placed during the hospital encounter of 05/03/14 (from the past 48 hour(s))  HEPATITIS C ANTIBODY     Status: None   Collection Time    05/04/14  6:32 AM      Result Value  Ref Range   HCV Ab NEGATIVE  NEGATIVE   Comment: (NOTE)     Effective June 26, 2014, Hepatitis C Antibody (test code 11914)     will be revised to automatically reflex to the Hepatitis C Viral RNA,     Quantitative, Real-Time PCR assay if the antibody screening result is     Reactive. This action is being taken to ensure that the CDC/USPSTF     recommended HCV diagnostic algorithm with the appropriate test reflex     needed for accurate interpretation is followed.     As of June 26, 2014 the name of the test code 78295 will be  Hepatitis C Antibody with Reflex to HCV RNA, Quantitative, Real-Time     PCR. This change will also be reflected in standard and custom     profiles that currently include test code 40981.     Performed at [facility]  HEPATITIS B SURFACE ANTIGEN     Status: None   Collection Time    05/04/14  6:32 AM      Result Value Ref Range   Hepatitis B Surface Ag NEGATIVE  NEGATIVE   Comment: Performed at [facility]  HIV ANTIBODY (ROUTINE TESTING)     Status: None   Collection Time    05/04/14  6:32 AM      Result Value Ref Range   HIV 1&2 Ab, 4th Generation NONREACTIVE  NONREACTIVE   Comment: (NOTE)     A NONREACTIVE HIV Ag/Ab result does not exclude HIV infection since     the time frame for seroconversion is variable. If acute HIV infection     is suspected, a HIV-1 RNA Qualitative TMA test is recommended.     HIV-1/2 Antibody Diff         Not indicated.     HIV-1 RNA, Qual TMA           Not indicated.     PLEASE NOTE: This information has been disclosed to you from records     whose confidentiality may be protected by state law. If your state     requires such protection, then the state law prohibits you from making     any further disclosure of the information without the specific written     consent of the person to whom it pertains, or as otherwise permitted     by law. A general authorization for the release of medical or other      information is NOT sufficient for this purpose.     The performance of this assay has not been clinically validated in     patients less than 45 years old.     Performed at [facility]  T4, FREE     Status: None   Collection Time    05/04/14  6:32 AM      Result Value Ref Range   Free T4 0.87  0.80 - 1.80 ng/dL   Comment: Performed at [facility]  T3     Status: None   Collection Time    05/04/14  6:32 AM      Result Value Ref Range   T3, Total 84.1  80.0 - 204.0 ng/dl   Comment: Performed at [facility]    Physical Findings: AIMS: Facial and Oral Movements Muscles of Facial Expression: None, normal Lips and Perioral Area: None, normal Jaw: None, normal Tongue: None, normal,Extremity Movements Upper (arms, wrists, hands, fingers): None, normal Lower (legs, knees, ankles, toes): None, normal, Trunk Movements Neck, shoulders, hips: None, normal, Overall Severity Severity of abnormal movements (highest score from questions above): None, normal Incapacitation due to abnormal movements: None, normal Patient's awareness of abnormal movements (rate only patient's report): No Awareness, Dental Status Current problems with teeth and/or dentures?: No Does patient usually wear dentures?: No  CIWA:  CIWA-Ar Total: 5 COWS:  COWS Total Score: 6  Assessment:  Patient is experiencing some increased opiate withdrawal symptoms at this time and seems vaguely restless , uncomfortable and nauseous. No vomiting. Remains somewhat depressed , but no SI and affect reactive. She wants to go live with her brother after  discharge, but it is unclear if he will allow her to do so if she does not agree to go to a Rehab after discharge from this unit, which she seems reluctant to consider at this time. States she may decide to go live with a friend instead.  Treatment Plan Summary: Daily contact with patient to assess and evaluate symptoms and progress in  treatment Medication management See below  Plan: Continue Detox with Clonidine as per opiate detox protocol. Increase Zoloft to 100 mgrs QDAY  Continue Trazodone at 100  mgrs QHS Continue to encourage patient to consider possible residential rehab program after discharge, but at this time seems reluctant about this disposition possibility.  Consider discharge soon as opiate WDL symptoms subside/resolve. Medical Decision Making Problem Points:  Established problem, stable/improving (1), Review of last therapy session (1) and Review of psycho-social stressors (1) Data Points:  Review of medication regiment & side effects (2) Review of new medications or change in dosage (2)  I certify that inpatient services furnished can reasonably be expected to improve the patient's condition.   COBOS, FERNANDO 05/05/2014, 1:49 PM

## 2014-05-06 DIAGNOSIS — F1410 Cocaine abuse, uncomplicated: Secondary | ICD-10-CM

## 2014-05-06 DIAGNOSIS — F1210 Cannabis abuse, uncomplicated: Secondary | ICD-10-CM

## 2014-05-06 LAB — CBC WITH DIFFERENTIAL/PLATELET
BASOS ABS: 0.1 10*3/uL (ref 0.0–0.1)
BASOS PCT: 0 % (ref 0–1)
EOS PCT: 1 % (ref 0–5)
Eosinophils Absolute: 0.2 10*3/uL (ref 0.0–0.7)
HCT: 36.8 % (ref 36.0–46.0)
Hemoglobin: 12 g/dL (ref 12.0–15.0)
LYMPHS PCT: 37 % (ref 12–46)
Lymphs Abs: 4.5 10*3/uL — ABNORMAL HIGH (ref 0.7–4.0)
MCH: 28.2 pg (ref 26.0–34.0)
MCHC: 32.6 g/dL (ref 30.0–36.0)
MCV: 86.4 fL (ref 78.0–100.0)
Monocytes Absolute: 0.6 10*3/uL (ref 0.1–1.0)
Monocytes Relative: 5 % (ref 3–12)
Neutro Abs: 6.9 10*3/uL (ref 1.7–7.7)
Neutrophils Relative %: 57 % (ref 43–77)
Platelets: 239 10*3/uL (ref 150–400)
RBC: 4.26 MIL/uL (ref 3.87–5.11)
RDW: 13.6 % (ref 11.5–15.5)
WBC: 12.2 10*3/uL — AB (ref 4.0–10.5)

## 2014-05-06 MED ORDER — ONDANSETRON 4 MG PO TBDP
4.0000 mg | ORAL_TABLET | Freq: Once | ORAL | Status: AC
  Administered 2014-05-06: 4 mg via ORAL
  Filled 2014-05-06: qty 1

## 2014-05-06 MED ORDER — TRAZODONE HCL 100 MG PO TABS
200.0000 mg | ORAL_TABLET | Freq: Every evening | ORAL | Status: DC | PRN
  Administered 2014-05-06 – 2014-05-10 (×5): 200 mg via ORAL
  Filled 2014-05-06 (×4): qty 2
  Filled 2014-05-06: qty 28
  Filled 2014-05-06: qty 2

## 2014-05-06 MED ORDER — ONDANSETRON 4 MG PO TBDP
ORAL_TABLET | ORAL | Status: AC
  Filled 2014-05-06: qty 1

## 2014-05-06 MED ORDER — ONDANSETRON 4 MG PO TBDP
8.0000 mg | ORAL_TABLET | Freq: Four times a day (QID) | ORAL | Status: AC | PRN
  Administered 2014-05-06 – 2014-05-07 (×4): 8 mg via ORAL
  Filled 2014-05-06 (×4): qty 2

## 2014-05-06 MED ORDER — METHOCARBAMOL 750 MG PO TABS
750.0000 mg | ORAL_TABLET | Freq: Three times a day (TID) | ORAL | Status: DC | PRN
Start: 2014-05-06 — End: 2014-05-10
  Administered 2014-05-06 – 2014-05-10 (×10): 750 mg via ORAL
  Filled 2014-05-06 (×10): qty 1

## 2014-05-06 NOTE — [facility] Group Notes (Signed)
[facility] Group Notes:  (Nursing/MHT/Case Management/Adjunct)  Date:  05/06/2014  Time:  1:34 PM  Type of Therapy:  Psychoeducational Skills  Participation Level:  Active  Participation Quality:  Appropriate  Affect:  Appropriate  Cognitive:  Appropriate  Insight:  Appropriate  Engagement in Group:  Engaged  Modes of Intervention:  Discussion  Summary of Progress/Problems: Pt did attend self inventory group, pt reported that she was negative SI/HI, no AH/VH noted. Pt rated her depression as a 10, and her helplessness/hopelessness as a 10.     Pt reported concerns about Zofran not working for her nausea and vomiting, pt advised that the doctor will be made aware.    Jacquelyne Balint Shanta 05/06/2014, 1:34 PM

## 2014-05-06 NOTE — [facility] Group Notes (Signed)
[facility] Group Notes:  (Clinical Social Work)  05/06/2014     10-11AM  Summary of Progress/Problems:   The main focus of today's process group was to learn how to use a decisional balance exercise to move forward in the Stages of Change, which were described and discussed.  Motivational Interviewing and a worksheet were utilized to help patients explore in depth the perceived benefits and costs of a self-sabotaging behavior, as well as the  benefits and costs of replacing that with a healthy coping mechanism.   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  She feels she is in the Preparation Stage of Change.  She was feeling very sick and tired throughout group.  Type of Therapy:  Group Therapy - Process   Participation Level:  Active  Participation Quality:  Attentive and Sharing  Affect:  Flat and Lethargic  Cognitive:  Oriented  Insight:  Developing/Improving  Engagement in Therapy:  Engaged  Modes of Intervention:  Education, Motivational Interviewing  Ambrose Mantle, LCSW 05/06/2014, 12:10 PM

## 2014-05-06 NOTE — Progress Notes (Signed)
Nursing Shift Note D: increased anxiety, depressed mood, somatic and medicine seeking behaviors. Reports she slept poorly, fair appetite, increased depression and anxiety, pain rated #10. A: daily goal is to be happy and get through her withdrawals. Medications administered per MD order. Support and encouragement given. R: will continue to monitor and stabilize and follow treatment plan.

## 2014-05-06 NOTE — Progress Notes (Signed)
[facility] MD Progress Note  05/06/2014 5:03 PM Abigail [name]  MRN:  [id number] Subjective:  Abigail [name] states that she is having a hard time with the withdrawal. Admits to a lot of nausea aches and pains. She is still not sure if she wants to go to a residential treatment program. She states that her brother (who is keeping her son) will be willing to allow her in if she was to pursue further treatment (30 days "clean") she is conflicted as she wants to be out when her BF gets out of jail. She would like to pursue the relationship with him further but she does not know where he is with that. She states she wants to get "straight" for her kid's sake.  Diagnosis:   DSM5: Substance/Addictive Disorders:  Opioid Disorder - Severe (304.00), Cocaine Use Disorder, Cannabis use disorder Depressive Disorders:  Major Depressive Disorder - Moderate (296.22) Total Time spent with patient: 30 minutes  Axis I: Substance Induced Mood Disorder  ADL's:  Intact  Sleep: Fair  Appetite:  Poor   Psychiatric Specialty Exam: Physical Exam  Review of Systems  Constitutional: Positive for malaise/fatigue.  HENT: Negative.   Eyes: Negative.   Respiratory: Positive for cough.        Side pain  Cardiovascular: Negative.   Gastrointestinal: Positive for nausea.  Genitourinary: Negative.   Musculoskeletal: Positive for myalgias.  Skin: Negative.   Neurological: Positive for dizziness and weakness.  Endo/Heme/Allergies: Negative.   Psychiatric/Behavioral: Positive for depression and substance abuse. The patient is nervous/anxious.     Blood pressure 98/70, pulse 57, temperature 98 F (36.7 C), temperature source Oral, resp. rate 18, height  (1.6 m), weight 65.318 kg (144 lb), last menstrual period 04/01/2014, SpO2 100.00%.Body mass index is 25.51 kg/(m^2).  General Appearance: Fairly Groomed  Patent attorney::  Fair  Speech:  Clear and Coherent  Volume:  fluctuates  Mood:  Anxious, Depressed and worried,  with aches and pains, nausea  Affect:  anxious, worried  Thought Process:  Coherent and Goal Directed  Orientation:  Full (Time, Place, and Person)  Thought Content:  symptoms worries concerns  Suicidal Thoughts:  No  Homicidal Thoughts:  No  Memory:  Immediate;   Fair Recent;   Fair Remote;   Fair  Judgement:  Fair  Insight:  Present  Psychomotor Activity:  Restlessness  Concentration:  Fair  Recall:  [employer] of Knowledge:NA  Language: Fair  Akathisia:  No  Handed:    AIMS (if indicated):     Assets:  Desire for Improvement  Sleep:  Number of Hours: 6.75   Musculoskeletal: Strength & Muscle Tone: within normal limits Gait & Station: normal Patient leans: N/A  Current Medications: Current Facility-Administered Medications  Medication Dose Route Frequency Provider Last Rate Last Dose  . acetaminophen (TYLENOL) tablet 650 mg  650 mg Oral Q6H PRN Kerry Hough, PA-C   650 mg at 05/06/14 1415  . alum & mag hydroxide-simeth (MAALOX/MYLANTA) 200-200-20 MG/5ML suspension 30 mL  30 mL Oral Q4H PRN Kerry Hough, PA-C      . azithromycin [facility]) tablet 250 mg  250 mg Oral Daily Kerry Hough, PA-C   250 mg at 05/06/14 0809  . chlordiazePOXIDE (LIBRIUM) capsule 25 mg  25 mg Oral QID PRN Rachael Fee, MD   25 mg at 05/06/14 1628  . cloNIDine (CATAPRES) tablet 0.1 mg  0.1 mg Oral BH-qamhs Kerry Hough, PA-C   0.1 mg at 05/06/14 [phone]  Followed by  . [START ON 05/08/2014] cloNIDine (CATAPRES) tablet 0.1 mg  0.1 mg Oral QAC breakfast Kerry Hough, PA-C      . dicyclomine (BENTYL) tablet 20 mg  20 mg Oral Q6H PRN Kerry Hough, PA-C   20 mg at 05/06/14 1334  . hydrOXYzine (ATARAX/VISTARIL) tablet 25 mg  25 mg Oral Q6H PRN Kerry Hough, PA-C   25 mg at 05/06/14 1334  . loperamide (IMODIUM) capsule 2-4 mg  2-4 mg Oral PRN Kerry Hough, PA-C      . magnesium hydroxide (MILK OF MAGNESIA) suspension 30 mL  30 mL Oral Daily PRN Kerry Hough, PA-C      .  methocarbamol (ROBAXIN) tablet 750 mg  750 mg Oral Q8H PRN Rachael Fee, MD   750 mg at 05/06/14 1129  . naproxen (NAPROSYN) tablet 500 mg  500 mg Oral BID PRN Kerry Hough, PA-C   500 mg at 05/06/14 1416  . nicotine (NICODERM CQ - dosed in mg/24 hours) patch 21 mg  21 mg Transdermal Q0600 Rachael Fee, MD   21 mg at 05/06/14 [phone]  . ondansetron (ZOFRAN-ODT) disintegrating tablet 8 mg  8 mg Oral Q6H PRN Rachael Fee, MD   8 mg at 05/06/14 1441  . sertraline (ZOLOFT) tablet 100 mg  100 mg Oral Daily Nehemiah Massed, MD   100 mg at 05/06/14 0809  . traZODone (DESYREL) tablet 200 mg  200 mg Oral QHS PRN Rachael Fee, MD        Lab Results: No results found for this or any previous visit (from the past 48 hour(s)).  Physical Findings: AIMS: Facial and Oral Movements Muscles of Facial Expression: None, normal Lips and Perioral Area: None, normal Jaw: None, normal Tongue: None, normal,Extremity Movements Upper (arms, wrists, hands, fingers): None, normal Lower (legs, knees, ankles, toes): None, normal, Trunk Movements Neck, shoulders, hips: None, normal, Overall Severity Severity of abnormal movements (highest score from questions above): None, normal Incapacitation due to abnormal movements: None, normal Patient's awareness of abnormal movements (rate only patient's report): No Awareness, Dental Status Current problems with teeth and/or dentures?: No Does patient usually wear dentures?: No  CIWA:  CIWA-Ar Total: 5 COWS:  COWS Total Score: 4  Treatment Plan Summary: Daily contact with patient to assess and evaluate symptoms and progress in treatment Medication management  Plan: Supportive approach/coping skills/relapse prevention           Increase the Zofran to 8 mg           Will increase the Robaxin to 750 mg   Medical Decision Making Problem Points:  Established problem, worsening (2) and Review of psycho-social stressors (1) Data Points:  Review of medication regiment & side  effects (2) Review of new medications or change in dosage (2)  I certify that inpatient services furnished can reasonably be expected to improve the patient's condition.   Katalea Ucci A 05/06/2014, 5:03 PM

## 2014-05-06 NOTE — [facility] Group Notes (Signed)
[facility] Group Notes:  (Nursing/MHT/Case Management/Adjunct)  Date:  05/06/2014  Time:  2:41 PM  Type of Therapy:  Psychoeducational Skills  Participation Level:  Active  Participation Quality:  Appropriate  Affect:  Appropriate  Cognitive:  Appropriate  Insight:  Appropriate  Engagement in Group:  Engaged  Modes of Intervention:  Discussion  Summary of Progress/Problems: Pt did attend healthy coping skills group.   Jacquelyne Balint Shanta 05/06/2014, 2:41 PM

## 2014-05-06 NOTE — Progress Notes (Signed)
Attended group 

## 2014-05-07 DIAGNOSIS — F1994 Other psychoactive substance use, unspecified with psychoactive substance-induced mood disorder: Secondary | ICD-10-CM

## 2014-05-07 DIAGNOSIS — F1910 Other psychoactive substance abuse, uncomplicated: Secondary | ICD-10-CM

## 2014-05-07 LAB — CULTURE, BLOOD (ROUTINE X 2)
CULTURE: NO GROWTH
CULTURE: NO GROWTH

## 2014-05-07 NOTE — Progress Notes (Signed)
[facility] MD Progress Note  05/07/2014 4:22 PM Abigail [name]  MRN:  [id number] Subjective: [name] has experienced drop in BP and some other symptoms coming from the clonidine. She states she thinks the withdrawal is probably over for what she would like to D/C it. She is struggling with what to do after she is D/C from here. Her family wants her to go to rehab. She is still conflicted. She states that one of her main triggers was that she was staying in drug infected place. She will not be going back there. She states that her mother is back in the picture and that she wants to help  Diagnosis:   DSM5: Substance/Addictive Disorders:  Alcohol Related Disorder - Severe (303.90) Depressive Disorders:  Major Depressive Disorder - Moderate (296.22) Total Time spent with patient: 30 minutes  Axis I: Substance Induced [name] Disorder  ADL's:  Intact  Sleep: Fair  Appetite:  Fair  Psychiatric Specialty Exam: Physical Exam  Review of Systems  Constitutional: Negative.   HENT: Negative.   Eyes: Negative.   Respiratory: Negative.   Cardiovascular: Negative.   Gastrointestinal: Negative.   Genitourinary: Negative.   Musculoskeletal: Negative.   Skin: Negative.   Neurological: Positive for dizziness.  Endo/Heme/Allergies: Negative.   Psychiatric/Behavioral: Positive for substance abuse. The patient is nervous/anxious.     Blood pressure 101/74, pulse 66, temperature 97.6 F (36.4 C), temperature source Oral, resp. rate 18, height  (1.6 m), weight 65.318 kg (144 lb), last menstrual period 04/01/2014, SpO2 100.00%.Body mass index is 25.51 kg/(m^2).  General Appearance: Fairly Groomed  Patent attorney::  Fair  Speech:  Clear and Coherent  Volume:  fluctuates  [name]:  Anxious and worried  Affect:  anxious, worried  Thought Process:  Coherent and Goal Directed  Orientation:  Full (Time, Place, and Person)  Thought Content:  symptoms worries concerns uncertainty as far as what to do   Suicidal Thoughts:  No  Homicidal Thoughts:  No  Memory:  Immediate;   Fair Recent;   Fair Remote;   Fair  Judgement:  Fair  Insight:  Present  Psychomotor Activity:  Restlessness  Concentration:  Fair  Recall:  [employer] of Knowledge:NA  Language: Fair  Akathisia:  No  Handed:    AIMS (if indicated):     Assets:  Desire for Improvement  Sleep:  Number of Hours: 5.5   Musculoskeletal: Strength & Muscle Tone: within normal limits Gait & Station: normal Patient leans: N/A  Current Medications: Current Facility-Administered Medications  Medication Dose Route Frequency Provider Last Rate Last Dose  . acetaminophen (TYLENOL) tablet 650 mg  650 mg Oral Q6H PRN Kerry Hough, PA-C   650 mg at 05/06/14 1415  . alum & mag hydroxide-simeth (MAALOX/MYLANTA) 200-200-20 MG/5ML suspension 30 mL  30 mL Oral Q4H PRN Kerry Hough, PA-C      . chlordiazePOXIDE (LIBRIUM) capsule 25 mg  25 mg Oral QID PRN Rachael Fee, MD   25 mg at 05/07/14 1340  . cloNIDine (CATAPRES) tablet 0.1 mg  0.1 mg Oral BH-qamhs Spencer E Simon, PA-C   0.1 mg at 05/07/14 9147   Followed by  . [START ON 05/08/2014] cloNIDine (CATAPRES) tablet 0.1 mg  0.1 mg Oral QAC breakfast Kerry Hough, PA-C      . dicyclomine (BENTYL) tablet 20 mg  20 mg Oral Q6H PRN Kerry Hough, PA-C   20 mg at 05/07/14 1340  . hydrOXYzine (ATARAX/VISTARIL) tablet 25 mg  25  mg Oral Q6H PRN Kerry Hough, PA-C   25 mg at 05/07/14 1340  . loperamide (IMODIUM) capsule 2-4 mg  2-4 mg Oral PRN Kerry Hough, PA-C      . magnesium hydroxide (MILK OF MAGNESIA) suspension 30 mL  30 mL Oral Daily PRN Kerry Hough, PA-C      . methocarbamol (ROBAXIN) tablet 750 mg  750 mg Oral Q8H PRN Rachael Fee, MD   750 mg at 05/07/14 0724  . naproxen (NAPROSYN) tablet 500 mg  500 mg Oral BID PRN Kerry Hough, PA-C   500 mg at 05/07/14 1340  . nicotine (NICODERM CQ - dosed in mg/24 hours) patch 21 mg  21 mg Transdermal Q0600 Rachael Fee, MD   21  mg at 05/07/14 0724  . ondansetron (ZOFRAN-ODT) disintegrating tablet 8 mg  8 mg Oral Q6H PRN Rachael Fee, MD   8 mg at 05/07/14 1340  . sertraline (ZOLOFT) tablet 100 mg  100 mg Oral Daily Nehemiah Massed, MD   100 mg at 05/07/14 0724  . traZODone (DESYREL) tablet 200 mg  200 mg Oral QHS PRN Rachael Fee, MD   200 mg at 05/06/14 2150    Lab Results:  Results for orders placed during the hospital encounter of 05/03/14 (from the past 48 hour(s))  CBC WITH DIFFERENTIAL     Status: Abnormal   Collection Time    05/06/14  8:00 PM      Result Value Ref Range   WBC 12.2 (*) 4.0 - 10.5 K/uL   RBC 4.26  3.87 - 5.11 MIL/uL   Hemoglobin 12.0  12.0 - 15.0 g/dL   HCT 16.1  09.6 - 04.5 %   MCV 86.4  78.0 - 100.0 fL   MCH 28.2  26.0 - 34.0 pg   MCHC 32.6  30.0 - 36.0 g/dL   RDW 40.9  81.1 - 91.4 %   Platelets 239  150 - 400 K/uL   Neutrophils Relative % 57  43 - 77 %   Neutro Abs 6.9  1.7 - 7.7 K/uL   Lymphocytes Relative 37  12 - 46 %   Lymphs Abs 4.5 (*) 0.7 - 4.0 K/uL   Monocytes Relative 5  3 - 12 %   Monocytes Absolute 0.6  0.1 - 1.0 K/uL   Eosinophils Relative 1  0 - 5 %   Eosinophils Absolute 0.2  0.0 - 0.7 K/uL   Basophils Relative 0  0 - 1 %   Basophils Absolute 0.1  0.0 - 0.1 K/uL   Comment: Performed at [facility]    Physical Findings: AIMS: Facial and Oral Movements Muscles of Facial Expression: None, normal Lips and Perioral Area: None, normal Jaw: None, normal Tongue: None, normal,Extremity Movements Upper (arms, wrists, hands, fingers): None, normal Lower (legs, knees, ankles, toes): None, normal, Trunk Movements Neck, shoulders, hips: None, normal, Overall Severity Severity of abnormal movements (highest score from questions above): None, normal Incapacitation due to abnormal movements: None, normal Patient's awareness of abnormal movements (rate only patient's report): No Awareness, Dental Status Current problems with teeth and/or dentures?:  No Does patient usually wear dentures?: No  CIWA:  CIWA-Ar Total: 5 COWS:  COWS Total Score: 5  Treatment Plan Summary: Daily contact with patient to assess and evaluate symptoms and progress in treatment Medication management  Plan: Supportive approach/coping skills/relapse prevention           Will hold the clonidine  Optimize response to psychotropics           Explore options of follow up after D/C  Medical Decision Making Problem Points:  Review of psycho-social stressors (1) Data Points:  Review of medication regiment & side effects (2) Review of new medications or change in dosage (2)  I certify that inpatient services furnished can reasonably be expected to improve the patient's condition.   Orville Mena A 05/07/2014, 4:22 PM

## 2014-05-07 NOTE — Plan of Care (Signed)
Problem: Alteration in mood & ability to function due to Goal: STG-Patient will report withdrawal symptoms Outcome: Progressing Patient is able to verbalize withdrawal symptoms and request medications to help with symptoms.  Problem: Diagnosis: Increased Risk For Suicide Attempt Goal: STG-Patient Will Comply With Medication Regime Outcome: Progressing Patient is compliant with scheduled medications.

## 2014-05-07 NOTE — Progress Notes (Signed)
Writer spoke with patient 1:1 at medication window. Patient c/o withdrawal symptoms and was medicated accordingly. She is aware of the changes made to her medications and is aware of the times that she has received her prns and will ask for them at the times they are due. She reports that she is still having a rough detox but feels a little better today. Patient had her mother to visit on today and she is planning on attending AA group tonight. Support and encouragement given, safety maintained on unit with 15 min checks.

## 2014-05-07 NOTE — Progress Notes (Signed)
Patient ID: Abigail [name], female   DOB: [birth year], 23 y.o.   MRN: [id number]   D: Pt has been flat and depressed on the unit, pt has also reported severe withdrawal symptoms. Pt has reported lots of anxiety, N/V, and loose stools. Pt was given several as needed medications to help with withdrawal symptoms.  Pt reported her depression as a 7, and her hopelessness as a 5. Pt reported being negative SI/HI, no AH/VH noted.  A: 15 min checks continued for patient safety. R: Pt safety maintained.

## 2014-05-07 NOTE — [facility] Group Notes (Signed)
[facility] Group Notes:  (Nursing/MHT/Case Management/Adjunct)  Date:  05/07/2014  Time:  5:39 PM  Type of Therapy:  Psychoeducational Skills  Participation Level:  Active  Participation Quality:  Appropriate  Affect:  Appropriate  Cognitive:  Appropriate  Insight:  Appropriate  Engagement in Group:  Engaged  Modes of Intervention:  Discussion  Summary of Progress/Problems: Pt did attend self inventory group.  Jacquelyne Balint Shanta 05/07/2014, 5:39 PM

## 2014-05-07 NOTE — Plan of Care (Signed)
Problem: Ineffective individual coping Goal: STG: Patient will remain free from self harm Outcome: Progressing Patient has remained free from self harm and denies suicidal ideations.      

## 2014-05-07 NOTE — Plan of Care (Signed)
Problem: Alteration in mood & ability to function due to Goal: STG-Patient will attend groups Outcome: Progressing Patient attended AA group this evening     

## 2014-05-07 NOTE — [facility] Group Notes (Signed)
[facility] Group Notes:  (Clinical Social Work)  05/07/2014  10:00-11:00AM  Summary of Progress/Problems:   The main focus of today's process group was to   1)  discuss the importance of adding supports  2)  define health supports versus unhealthy supports  3)  identify the patient's current unhealthy supports and plan how to handle them  4)  Identify the patient's current healthy supports and plan what to add.  An emphasis was placed on using counselor, doctor, therapy groups, 12-step groups, and problem-specific support groups to expand supports.    The patient expressed full comprehension of the concepts presented, and agreed that there is a need to add more supports.  The patient stated her current supports include only her brother and sister-in-law, and stated she does not know what to add.  However, later in group she did talk about adding 90 Narcotics Anonymous meetings in 90 days.  She stated that people want her to go to rehab, and she does not want to go, does not feel it is necessary.  Type of Therapy:  Process Group with Motivational Interviewing  Participation Level:  Active  Participation Quality:  Attentive  Affect:  Blunted and Depressed  Cognitive:  Appropriate  Insight:  Developing/Improving  Engagement in Therapy:  Engaged  Modes of Intervention:   Education, Support and Processing, Activity  [employer], LCSW 05/07/2014, 12:15pm

## 2014-05-07 NOTE — Plan of Care (Signed)
Problem: Alteration in mood & ability to function due to Goal: STG-Pt will be introduced to the 12-step program of recovery (Patient will be introduced to the 12-step program of recovery and disease concept of addiction)  Outcome: Progressing Patient attended AA group this evening.     

## 2014-05-07 NOTE — Progress Notes (Signed)
Patient did attend the evening speaker AA meeting.  

## 2014-05-07 NOTE — Progress Notes (Signed)
Writer has observed patient up in the dayroom laughing and talking with peers.She attended group this evening and reports that she enjoyed the AA group. She has been smiling more and reports that she feels better since receiving medications earlier this evening. Patient is still uncertain if she wants to go to a 30 day treatment program once discharged. She reports that she may volunteer somewhere because she knows that if she get a job and makes money she will want to buy drugs and use again. Support and encouragement given, safety maintained on unit with 15 min checks.

## 2014-05-07 NOTE — [facility] Group Notes (Signed)
[facility] Group Notes:  (Nursing/MHT/Case Management/Adjunct)  Date:  05/07/2014  Time:  5:39 PM  Type of Therapy:  Psychoeducational Skills  Participation Level:  Active  Participation Quality:  Appropriate  Affect:  Appropriate  Cognitive:  Appropriate  Insight:  Appropriate  Engagement in Group:  Engaged  Modes of Intervention:  Discussion  Summary of Progress/Problems: Pt did attend healthy support systems group. Abigail [name] 05/07/2014, 5:39 PM

## 2014-05-08 MED ORDER — ONDANSETRON 4 MG PO TBDP
8.0000 mg | ORAL_TABLET | Freq: Four times a day (QID) | ORAL | Status: AC | PRN
  Administered 2014-05-08 – 2014-05-10 (×5): 8 mg via ORAL
  Filled 2014-05-08 (×6): qty 2

## 2014-05-08 MED ORDER — DICYCLOMINE HCL 20 MG PO TABS
20.0000 mg | ORAL_TABLET | Freq: Four times a day (QID) | ORAL | Status: DC | PRN
  Administered 2014-05-08 – 2014-05-10 (×6): 20 mg via ORAL
  Filled 2014-05-08 (×6): qty 1

## 2014-05-08 MED ORDER — HYDROXYZINE HCL 25 MG PO TABS
25.0000 mg | ORAL_TABLET | Freq: Four times a day (QID) | ORAL | Status: DC | PRN
  Administered 2014-05-08 – 2014-05-10 (×6): 25 mg via ORAL
  Filled 2014-05-08 (×7): qty 1

## 2014-05-08 MED ORDER — NAPROXEN 500 MG PO TABS
500.0000 mg | ORAL_TABLET | Freq: Two times a day (BID) | ORAL | Status: DC | PRN
  Administered 2014-05-08 – 2014-05-10 (×5): 500 mg via ORAL
  Filled 2014-05-08 (×6): qty 1

## 2014-05-08 NOTE — [facility] Group Notes (Signed)
[facility] LCSW Group Therapy  05/08/2014 3:24 PM  Type of Therapy:  Group Therapy  Participation Level:  Active  Participation Quality:  Attentive  Affect:  Appropriate  Cognitive:  Oriented  Insight:  Improving  Engagement in Therapy:  Improving  Modes of Intervention:  Confrontation, Discussion, Education, Exploration, Limit-setting, Problem-solving, Rapport Building, Socialization and Support  Summary of Progress/Problems: Today's Topic: Overcoming Obstacles. Pt identified obstacles faced currently and processed barriers involved in overcoming these obstacles. Pt identified steps necessary for overcoming these obstacles and explored motivation (internal and external) for facing these difficulties head on. Pt further identified one area of concern in their lives and chose a skill of focus pulled from their "toolbox." Abigail [name] was attentive and engaged during today's therapy group. She shared that her current obstacle is "figuring out what I am going to do when I leave the hospital." Abigail [name] shared that she does not want to go to [facility] but feels pressure from family and the doctor. She stated that her plan is to return home and go to [facility] for the 90 in 90 challenge, and [facility]. She is also interested in [facility] IOP through [facility] and reported that she is working with CSW and CPT care coordinator to get connected to services. Abigail [name] shows improving insight and progress in the group setting AEB her ability to process how she can create her own social support network being that her family supports are lacking. She expressed her desire to "get my life and happiness back, and to be proud of myself."   Abigail [name], Abigail [name]  05/08/2014, 3:24 PM

## 2014-05-08 NOTE — Progress Notes (Signed)
Adult Psychoeducational Group Note  Date:  05/08/2014 Time:  8:15pm  Group Topic/Focus:  Wrap-Up Group:   The focus of this group is to help patients review their daily goal of treatment and discuss progress on daily workbooks.  Participation Level:  Active  Participation Quality:  Appropriate and Attentive  Affect:  Appropriate  Cognitive:  Alert and Appropriate  Insight: Appropriate  Engagement in Group:  Engaged  Modes of Intervention:  Discussion  Additional Comments:  Pt. Was attentive and appropriate during tonight's group discussion.  Pt stated that today was a good day. Pt shared about her anxiety and trying to set up for outpatient therapy.   Bing Plume D 05/08/2014, 9:31 PM

## 2014-05-08 NOTE — Progress Notes (Signed)
Patient ID: Abigail [name], female   DOB: [birth year], 22 y.o.   MRN: [id number] [facility] MD Progress Note  05/08/2014 8:32 AM Abigail [name]  MRN:  [id number] Subjective:   Patient continues to report withdrawal symptoms. Objective: Although improved compared to prior, patient has had a rather protracted opiate withdrawal and at this time is continuing to experience nausea, some diarrhea, abdominal cramping, watery eyes, rhinorrhea, and myalgias. She does feel better than before, but is clearly still experiencing some withdrawal. She is having cravings, but seems motivated in maintaining sobriety, and states she is " really wanting to get clean for real", and is wanting to avoid people and situations that may increase her risk of relapse. Behavior on unit in good control. No disruptive behaviors. Tolerating medications well. Denies side effects. States that Zoloft has been helpful in improving her mood and denies any increase or emergence of self injurious ideations or agitation on the Zoloft. I have encouraged patient to strongly consider an inpatient rehabilitation program after discharge- she seems genuinely and strongly motivated at this time , but is also describing cravings, and limited sober support network, making an inpatient rehab an appropriate next level of care. She remains ambivalent about this option .   Diagnosis:  Opiate Dependene, Opiate Withdrawal, Depression NOS   ADL's: improving  Sleep: fair , but improving   Appetite:   Fair   Suicidal Ideation:  Denies suicidal ideations Homicidal Ideation:  Denies  AEB (as evidenced by):  Psychiatric Specialty Exam: Physical Exam  Review of Systems  Constitutional: Positive for malaise/fatigue. Negative for fever.  Respiratory: Positive for cough. Negative for shortness of breath.   Cardiovascular: Negative for chest pain.  Gastrointestinal: Positive for nausea. Negative for vomiting and diarrhea.  Genitourinary: Negative.    Musculoskeletal: Positive for myalgias.  Skin: Negative for rash.  Psychiatric/Behavioral: Positive for depression and substance abuse. Negative for suicidal ideas.    Blood pressure 109/62, pulse 72, temperature 98 F (36.7 C), temperature source Oral, resp. rate 16, height  (1.6 m), weight 65.318 kg (144 lb), last menstrual period 04/01/2014, SpO2 100.00%.Body mass index is 25.51 kg/(m^2).  General Appearance: grooming improved   Eye Contact::  Good  Speech:  Clear and Coherent  Volume:  Normal  Mood:  less depressed, affect constricted but more reactive   Affect:  remains vaguely constricted, but does smile often and appropriately  Thought Process:  Goal Directed and Linear  Orientation:  Other:  fully alert and attentive  Thought Content:  no hallucinations, no delusions  Suicidal Thoughts:  No- at this time denies any suicidal or homicidal ideations  Homicidal Thoughts:  No  Memory:  recent and remote grossly intact  Judgement:  Other:  improving   Insight:  Fair  Psychomotor Activity:  Normal  Concentration:  Good  Recall:  Good  Fund of Knowledge:Good  Language: Good  Akathisia:  Negative  Handed:  Right  AIMS (if indicated):     Assets:  Communication Skills Desire for Improvement Resilience  Sleep:  Number of Hours: 6.25   Musculoskeletal: Strength & Muscle Tone: within normal limits Gait & Station: normal Patient leans: N/A  Current Medications: Current Facility-Administered Medications  Medication Dose Route Frequency Provider Last Rate Last Dose  . acetaminophen (TYLENOL) tablet 650 mg  650 mg Oral Q6H PRN Kerry Hough, PA-C   650 mg at 05/06/14 1415  . alum & mag hydroxide-simeth (MAALOX/MYLANTA) 200-200-20 MG/5ML suspension 30 mL  30 mL Oral Q4H PRN  Kerry Hough, PA-C      . chlordiazePOXIDE (LIBRIUM) capsule 25 mg  25 mg Oral QID PRN Rachael Fee, MD   25 mg at 05/08/14 0600  . cloNIDine (CATAPRES) tablet 0.1 mg  0.1 mg Oral QAC breakfast  Kerry Hough, PA-C   0.1 mg at 05/08/14 0981  . dicyclomine (BENTYL) tablet 20 mg  20 mg Oral Q6H PRN Nehemiah Massed, MD      . hydrOXYzine (ATARAX/VISTARIL) tablet 25 mg  25 mg Oral Q6H PRN Nehemiah Massed, MD      . magnesium hydroxide (MILK OF MAGNESIA) suspension 30 mL  30 mL Oral Daily PRN Kerry Hough, PA-C      . methocarbamol (ROBAXIN) tablet 750 mg  750 mg Oral Q8H PRN Rachael Fee, MD   750 mg at 05/08/14 0600  . naproxen (NAPROSYN) tablet 500 mg  500 mg Oral BID PRN Nehemiah Massed, MD      . nicotine (NICODERM CQ - dosed in mg/24 hours) patch 21 mg  21 mg Transdermal Q0600 Rachael Fee, MD   21 mg at 05/07/14 0724  . ondansetron (ZOFRAN-ODT) disintegrating tablet 8 mg  8 mg Oral Q6H PRN Nehemiah Massed, MD      . sertraline (ZOLOFT) tablet 100 mg  100 mg Oral Daily Nehemiah Massed, MD   100 mg at 05/07/14 0724  . traZODone (DESYREL) tablet 200 mg  200 mg Oral QHS PRN Rachael Fee, MD   200 mg at 05/07/14 2201    Lab Results:  Results for orders placed during the hospital encounter of 05/03/14 (from the past 48 hour(s))  CBC WITH DIFFERENTIAL     Status: Abnormal   Collection Time    05/06/14  8:00 PM      Result Value Ref Range   WBC 12.2 (*) 4.0 - 10.5 K/uL   RBC 4.26  3.87 - 5.11 MIL/uL   Hemoglobin 12.0  12.0 - 15.0 g/dL   HCT 19.1  47.8 - 29.5 %   MCV 86.4  78.0 - 100.0 fL   MCH 28.2  26.0 - 34.0 pg   MCHC 32.6  30.0 - 36.0 g/dL   RDW 62.1  30.8 - 65.7 %   Platelets 239  150 - 400 K/uL   Neutrophils Relative % 57  43 - 77 %   Neutro Abs 6.9  1.7 - 7.7 K/uL   Lymphocytes Relative 37  12 - 46 %   Lymphs Abs 4.5 (*) 0.7 - 4.0 K/uL   Monocytes Relative 5  3 - 12 %   Monocytes Absolute 0.6  0.1 - 1.0 K/uL   Eosinophils Relative 1  0 - 5 %   Eosinophils Absolute 0.2  0.0 - 0.7 K/uL   Basophils Relative 0  0 - 1 %   Basophils Absolute 0.1  0.0 - 0.1 K/uL   Comment: Performed at [facility]    Physical Findings: AIMS: Facial and Oral  Movements Muscles of Facial Expression: None, normal Lips and Perioral Area: None, normal Jaw: None, normal Tongue: None, normal,Extremity Movements Upper (arms, wrists, hands, fingers): None, normal Lower (legs, knees, ankles, toes): None, normal, Trunk Movements Neck, shoulders, hips: None, normal, Overall Severity Severity of abnormal movements (highest score from questions above): None, normal Incapacitation due to abnormal movements: None, normal Patient's awareness of abnormal movements (rate only patient's report): No Awareness, Dental Status Current problems with teeth and/or dentures?: No Does patient usually wear dentures?: No  CIWA:  CIWA-Ar Total: 1 COWS:  COWS Total Score: 1  Assessment:  Patient continues to present active symptoms of opiate withdrawal, even though last use was about 6-7 days ago. She states she does have a history of protracted withdrawals in the past, and recent respiratory infection may have contributed to her feeling physicially ill, Mood remains dysphoric but is clearly improved compared to admission. Tolerating Zoloft well.   Treatment Plan Summary: Daily contact with patient to assess and evaluate symptoms and progress in treatment Medication management See below  Plan: Continue Detox support by renewing Bentyl, Robaxin, Naproxen, Vistaril PRNS. Zoloft 100 mgrs QDAY  Trazodone 200  mgrs QHS Continue to encourage patient to consider possible residential rehab program after discharge.  Family meeting with brother ( her closest sober support system) tentatively scheduled for tomorrow Medical Decision Making Problem Points:  Established problem, stable/improving (1), Review of last therapy session (1) and Review of psycho-social stressors (1) Data Points:  Review of medication regiment & side effects (2) Review of new medications or change in dosage (2)  I certify that inpatient services furnished can reasonably be expected to improve the  patient's condition.   Francy Mcilvaine 05/08/2014, 8:32 AM

## 2014-05-08 NOTE — [facility] Group Notes (Signed)
[facility] LCSW Aftercare Discharge Planning Group Note   05/08/2014 9:50 AM  Participation Quality:  Appropriate   Mood/Affect:  Appropriate  Depression Rating:  5  Anxiety Rating:  10  Thoughts of Suicide:  No Will you contract for safety?   NA  Current AVH:  No  Plan for Discharge/Comments:  Pt reporting moderate withdrawals, but "They are getting better." Pt stated that she wants to stay at [facility] for as long as possible to avoid relapse due to severity of her withdrawals. Pt stated that she plans to return home with her mom and follow up at [facility] for med management and therapy. She is still open to [facility] (no bed availability today). CSW assessing. Pt scheduled for d/c Tues/Wed (tentative).   Transportation Means: family  Supports: Naval architect, Oncologist

## 2014-05-08 NOTE — Plan of Care (Signed)
Problem: Alteration in mood Goal: STG-Patient is able to discuss feelings and issues (Patient is able to discuss feelings and issues leading to depression)  Outcome: Progressing Patient is able to come to staff with questions or concerns. Patient is able to discuss her feelings of stress, anxiety and depression.

## 2014-05-08 NOTE — Progress Notes (Signed)
Patient ID: Abigail [name], female   DOB: October 11, 1990, 23 y.o.   MRN: [id number]  D: Pt. Denies SI/HI and A/V Hallucinations. Patient does report generalized pain that she relates to withdrawal symptoms. Patient is given PRN medication for abdominal cramping, nausea, pain, and anxiety. Patient reports relief from all except stomach cramping upon reassessment. Patient reports sleeping well, concentration level good, and energy level low today. Patient reports that a distant family member passed away today whom was close to her child. She reports that this person had an obituary and her son's name was listed under the remembrance. Patient states she is very happy about that but feels a little overwhelmed by the news. Patient rates her depression 5/10 for the day and anxiety (prior to admin of Vistaril) 10/10. Patient reports she is scared that she will relapse but is undecided about further treatment after discharge.  A: Support and encouragement provided to the patient to speak with her support systems and continue to follow treatment plan. Scheduled medications administered to patient per physician's orders.  R: Patient is receptive and cooperative with this Clinical research associate. Patient is seen in the milieu and is attending group today. Q15 minute checks are maintained for safety.

## 2014-05-09 MED ORDER — SERTRALINE HCL 100 MG PO TABS: 100.0000 mg | ORAL_TABLET | Freq: Every day | ORAL | Status: DC

## 2014-05-09 MED ORDER — TRAZODONE HCL 100 MG PO TABS: 200.0000 mg | ORAL_TABLET | Freq: Every evening | ORAL | Status: DC | PRN

## 2014-05-09 NOTE — Tx Team (Signed)
Interdisciplinary Treatment Plan Update (Adult)   Date: 05/09/2014  Time Reviewed:10:53 AM  Progress in Treatment:  Attending groups: Yes  Participating in groups:  Yes  Taking medication as prescribed: Yes  Tolerating medication: Yes  Family/Significant othe contact made: SPE completed with Abigail [name]'s brother.  Family session on 9/29 at 10:00am with mother, brother, Abigail [name], and doctor.  Patient understands diagnosis: Yes, AEB seeking treatment for Opioid detox, SI, mood stabilization, and medication management.  Discussing patient identified problems/goals with staff: Yes  Medical problems stabilized or resolved: Yes  Denies suicidal/homicidal ideation: Yes during group/self report.  Patient has not harmed self or Others: Yes  New problem(s) identified: Abigail [name] reporting continued withdrawal symptoms including crampy, cold sweats, aches/pains, and anxiety. Abigail [name] requesting one more day due to withdrawals.  Discharge Plan or Barriers: No beds available at [facility] at this time. Abigail [name] on waiting list per Melissa. Abigail [name] has appt at [facility] for followup (med management and therapy). From there, they will refer her to [facility] for [facility] IOP (unable to make direct appt with [facility] per CPT).  Additional comments: Abigail [name] continues to endorse withdrawals and demonstrate mood lability. Abigail [name] upset when told that she was d/cing today and is scared about relapsing. Abigail [name] unsure about going to [facility] if and when they have a bed. In family session, brother and mother told her she could not come home unless she agreed to go to [facility]. Abigail [name] became upset and left room.  Reason for Continuation of Hospitalization: Mood stabilization Medication management  Estimated length of stay: 1 day (d/c scheduled for Wed).  For review of initial/current patient goals, please see plan of care.  Attendees:  Patient:    Family:    Physician: Dr. Rosezella Rumpfobbos MD 05/09/2014 10:53 AM   Nursing: Deeann Creeonna S. RN 05/09/2014 10:53 AM    Clinical Social Worker Shenouda Genova Smart, LCSWA   05/09/2014 10:53 AM   Other: Rodell PernaPatrice RN   Other: Darden DatesJennifer C. Nurse CM 05/09/2014 10:53 AM   Other: Marene LenzQuylle, LCSW 05/09/2014 10:53 AM   Other:    Scribe for Treatment Team:  Trula SladeHeather Smart LCSWA 05/09/2014 10:56 AM  05/09/2014 10:53 AM

## 2014-05-09 NOTE — Progress Notes (Signed)
Recreation Therapy Notes  Animal-Assisted Activity/Therapy (AAA/T) Program Checklist/Progress Notes Patient Eligibility Criteria Checklist & Daily Group note for Rec Tx Intervention  Date: 09.29.2015 Time: 2:45pm Location: [address] Dayroom   AAA/T Program Assumption of Risk Form signed by Patient/ or Parent Legal Guardian yes  Patient is free of allergies or sever asthma yes  Patient reports no fear of animals yes  Patient reports no history of cruelty to animals yes   Patient understands his/her participation is voluntary yes  Patient washes hands before animal contact yes  Patient washes hands after animal contact yes  Behavioral Response: Appropriate   Education: Hand Washing, Appropriate Animal Interaction   Education Outcome: Acknowledges education.   Clinical Observations/Feedback: Patient interacted appropriately with therapy dog and peers during session. Patient asked appropriate questions about therapy dog and shared stories about pets she has at home with group.  Marykay Lexenise L Natelie Ostrosky, LRT/CTRS  Thor Nannini L 05/09/2014 4:09 PM

## 2014-05-09 NOTE — Progress Notes (Signed)
Patient ID: Abigail PlyElizabeth A Koos, female   DOB: 01/27/1991, 23 y.o.   MRN: [id number] She has been up and to groups interacting with peers  laughing and joking at times. Has requested prn medication for  Pain, anxiety, nausea  and withdrawal symptoms today at correct times she could have prn. PRN's were effective. She had a family meeting today and left before meeting was over. Stated they were not listening to what she wanted. Self inventory this AM: depression, hopelessness 0, anxiety 10, indicated  several withdrawal symptoms, denies SI thoughts.

## 2014-05-09 NOTE — Progress Notes (Addendum)
Chaplain provided support with pt around discharge plans and grief related to anniversary of father's death.   Pt was in group on grief and loss, but was pulled from group several times to meet with MD and family regarding discharge.  Pt encountered chaplain in hallway after group session and apologized.  Described Friday being the one-year anniversary of her father's death.  States she feels stress around this and her impending discharge.  Marisue IvanLiz described feeling as though she is "still withdrawing" and is "not ready to discharge."  Described not wanting to attend long-term rehab.  In conversation, chaplain explored reluctance with pt, but she was not specific about why "rehab is not a good fit for me."   Rather, explained that she would rather attend "90 meetings in 90 days" and do intensive outpatient.    Chaplain noted that, in addition to her family telling her that she cannot come home without attending rehab, the anniversary of her father's death was potentially a significant stressor and inquired about her plans to cope with grief and stress.  Pt mentioned contacting her outpatient therapist.  Chaplain encouraged developing plan and spoke with pt about rehab possibly being a safe place to be.     Inquiring further about pt's plans, Marisue IvanLiz stated she may stay [place] in order to stay away from relationships in [place] That encourage her substance use.    Chaplain will follow up with pt for continued support.    Belva CromeStalnaker, Yohanna Tow [place] MDiv

## 2014-05-09 NOTE — Progress Notes (Signed)
Pt observed in the dayroom talking with peers and watching TV throughout the evening.  She attended evening wrap-up group.  Pt is still asking for prn medications frequently.  A short time after 2100, pt came to the med window asking for medications.  She asked for multiple meds and when writer gave the requested meds, she asked if there were any other meds she could have.  Pt was told that there would be a couple of other meds available at 2300.  Pt received her trazodone from another RN because writer was not available at the time of her request.  Pt then came back up to the nurse's station after 2300 asking for the medications that were available at that time.  Writer spent some time talking to pt about her plans and future if she did not get her life on track.  Pt was grateful for time writer spent with pt.  Pt denies SI/HI/AV at this time.  Support and encouragement offered.  Safety maintained with q15 minute checks.

## 2014-05-09 NOTE — [facility] Group Notes (Signed)
[facility] LCSW Group Therapy  05/09/2014 3:50 PM  Type of Therapy:  Group Therapy  Participation Level:  Active  Participation Quality:  Attentive  Affect:  Appropriate  Cognitive:  Oriented  Insight:  Limited  Engagement in Therapy:  Improving  Modes of Intervention:  Confrontation, Discussion, Education, Exploration, Limit-setting, Problem-solving, Rapport Building, Socialization and Support  Summary of Progress/Problems: Feelings around Diagnosis-pt's were encouraged to explore their personal thoughts about their diagnosis and how this affects them. Abigail [name] was attentive and engaged during today's processing group. She shared that "mental illness does not define me, but substance abuse is a different story." Abigail [name] was able to identify the difference she felt in [name] and [place] ("choice and control"). Abigail [name] demonstrates limited insight AEB her difficulty in processing how her goals translate into action, specifically working with her supports/going to treatment, etc. Abigail [name] stated that she is motivated to stay clean but found it difficult to identify what she is doing to work on this.   Smart, Duriel Deery LCSWA  05/09/2014, 3:50 PM

## 2014-05-09 NOTE — Progress Notes (Addendum)
Patient ID: Abigail [name], female   DOB: 07-03-1991, 23 y.o.   MRN: [id number] [facility] MD Progress Note  05/09/2014 11:34 AM Abigail [name]  MRN:  [id number] Subjective:    " I don't feel ready to leave, I feel like I'm still withdrawing". Objective:  I have discussed case with treatment team and have met with patient. Have also met with patient, Nira Conn Dance movement psychotherapist) and patient's mother and borther, who came for family meeting. Patient reports ongoing symptoms of withdrawal, although she does admit she is feeling better, and currently does not appear to be in any acute discomfort or distress. She does , as session progresses, describe significant cravings and fears of relapsing after discharge and appears to experience feelings of craving for opiates as withdrawal symptoms.  She reports ongoing cramps, diffuse aches, some nausea. On unit she has been pleasant, participative in groups. Patient has been encouraged to consider going to a Rehab, and is currently on waiting list to [facility] [place]. She remains ambivalent, in spite of mother and brother entreating her to consider this option. She prefers outpatient treatment than going to Rehab, and both mother and brother reported she would not be able to return home unless she agreed to go to a Rehab. Patient stated she had sober friends to stay with if they don't allow her back. Family states that her reason for being reluctant to go to  inpatient rehab is that her boyfriend is being released from jail soon and she wants to be with him. She minimizes this as a factor. She is tolerating Zoloft well. Presents with emotional lability, tearfulness during family session, but in milieu presents with much improved mood and range of affect.    Duration- 35 minutes  .   Diagnosis:  Opiate Dependene, Opiate Withdrawal, Depression NOS   ADL's: improving  Sleep: fair , but improving   Appetite:   Fair   Suicidal Ideation:  Denies suicidal  ideations Homicidal Ideation:  Denies  AEB (as evidenced by):  Psychiatric Specialty Exam: Physical Exam  Review of Systems  Constitutional: Positive for chills and malaise/fatigue. Negative for fever.  Respiratory: Positive for cough. Negative for shortness of breath.   Cardiovascular: Negative for chest pain.  Gastrointestinal: Positive for nausea. Negative for vomiting and diarrhea.  Genitourinary: Negative.   Musculoskeletal: Positive for myalgias.  Skin: Negative for itching and rash.  Neurological: Negative for headaches.  Psychiatric/Behavioral: Positive for depression and substance abuse. Negative for suicidal ideas.    Blood pressure 123/73, pulse 82, temperature 97.8 F (36.6 C), temperature source Oral, resp. rate 18, height '5\' 3"'  (1.6 m), weight 65.318 kg (144 lb), last menstrual period 04/01/2014, SpO2 100.00%.Body mass index is 25.51 kg/(m^2).  General Appearance: grooming improved   Eye Contact::  Good  Speech:  Clear and Coherent  Volume:  Normal  Mood:  variable, vaguely dysphoric  Affect:  Labile and but improved compared to admission and appears full in range during milieu / peer interactions  Thought Process:  Goal Directed and Linear  Orientation:  Other:  fully alert and attentive  Thought Content:  no hallucinations, no delusions  Suicidal Thoughts:  No- at this time denies any suicidal or homicidal ideations  Homicidal Thoughts:  No  Memory:  recent and remote grossly intact  Judgement:  Other:  improving   Insight:  Fair  Psychomotor Activity:  Normal  Concentration:  Good  Recall:  Good  Fund of Knowledge:Good  Language: Good  Akathisia:  Negative  Handed:  Right  AIMS (if indicated):     Assets:  Communication Skills Desire for Improvement Resilience  Sleep:  Number of Hours: 5.5   Musculoskeletal: Strength & Muscle Tone: within normal limits Gait & Station: normal Patient leans: N/A  Current Medications: Current Facility-Administered  Medications  Medication Dose Route Frequency Provider Last Rate Last Dose  . acetaminophen (TYLENOL) tablet 650 mg  650 mg Oral Q6H PRN Laverle Hobby, PA-C   650 mg at 05/06/14 1415  . alum & mag hydroxide-simeth (MAALOX/MYLANTA) 200-200-20 MG/5ML suspension 30 mL  30 mL Oral Q4H PRN Laverle Hobby, PA-C      . chlordiazePOXIDE (LIBRIUM) capsule 25 mg  25 mg Oral QID PRN Nicholaus Bloom, MD   25 mg at 05/09/14 1010  . dicyclomine (BENTYL) tablet 20 mg  20 mg Oral Q6H PRN Neita Garnet, MD   20 mg at 05/09/14 5366  . hydrOXYzine (ATARAX/VISTARIL) tablet 25 mg  25 mg Oral Q6H PRN Neita Garnet, MD   25 mg at 05/09/14 4403  . magnesium hydroxide (MILK OF MAGNESIA) suspension 30 mL  30 mL Oral Daily PRN Laverle Hobby, PA-C      . methocarbamol (ROBAXIN) tablet 750 mg  750 mg Oral Q8H PRN Nicholaus Bloom, MD   750 mg at 05/09/14 1006  . naproxen (NAPROSYN) tablet 500 mg  500 mg Oral BID PRN Neita Garnet, MD   500 mg at 05/09/14 4742  . nicotine (NICODERM CQ - dosed in mg/24 hours) patch 21 mg  21 mg Transdermal Q0600 Nicholaus Bloom, MD   21 mg at 05/09/14 0849  . ondansetron (ZOFRAN-ODT) disintegrating tablet 8 mg  8 mg Oral Q6H PRN Neita Garnet, MD   8 mg at 05/09/14 1101  . sertraline (ZOLOFT) tablet 100 mg  100 mg Oral Daily Neita Garnet, MD   100 mg at 05/09/14 0849  . traZODone (DESYREL) tablet 200 mg  200 mg Oral QHS PRN Nicholaus Bloom, MD   200 mg at 05/08/14 2212    Lab Results:  No results found for this or any previous visit (from the past 48 hour(s)).  Physical Findings: AIMS: Facial and Oral Movements Muscles of Facial Expression: None, normal Lips and Perioral Area: None, normal Jaw: None, normal Tongue: None, normal,Extremity Movements Upper (arms, wrists, hands, fingers): None, normal Lower (legs, knees, ankles, toes): None, normal, Trunk Movements Neck, shoulders, hips: None, normal, Overall Severity Severity of abnormal movements (highest score from questions above):  None, normal Incapacitation due to abnormal movements: None, normal Patient's awareness of abnormal movements (rate only patient's report): No Awareness, Dental Status Current problems with teeth and/or dentures?: No Does patient usually wear dentures?: No  CIWA:  CIWA-Ar Total: 1 COWS:  COWS Total Score: 7  Assessment:  Patient gradually improving. Reports a history of protracted withdrawal symptoms , and at this time continues to report some opiate withdrawal symptomatology. Vitals are stable and she does not appear to be in any acute distress or discomfort. She is endorsing cravings and fear of relapse due to this, and has been encouraged to consider an inpatient Rehab by family and provider.  Family members state she cannot live with them unless she goes to a Rehab first. She reports reluctance/ambivalence about this treatment option, but does seem motivated to go to an IOP type program if available.    Treatment Plan Summary: Daily contact with patient to assess and evaluate symptoms and progress in treatment Medication management See  below  Plan: Continue Detox-Bentyl, Robaxin, Naproxen, Vistaril PRNS. Zoloft 100 mgrs QDAY Trazodone 200  mgrs QHS Treatment team working on disposition planning.  At this time, due to patient's emotional lability during family meeting and report of ongoing withdrawal will continue inpatient treatment, and continue to encourage inpatient Rehab as recommended dispo plan .  Medical Decision Making Problem Points:  Established problem, stable/improving (1), Review of last therapy session (1) and Review of psycho-social stressors (1) Data Points:  Review of medication regiment & side effects (2) Review of new medications or change in dosage (2)  I certify that inpatient services furnished can reasonably be expected to improve the patient's condition.   Etoy Mcdonnell 05/09/2014, 11:34 AM

## 2014-05-09 NOTE — Plan of Care (Signed)
Problem: Alteration in mood & ability to function due to Goal: LTG-Pt reports reduction in suicidal thoughts (Patient reports reduction in suicidal thoughts and is able to verbalize a safety plan for whenever patient is feeling suicidal)  Outcome: Completed/Met Date Met:  05/09/14 Pt denies suicidal thoughts

## 2014-05-09 NOTE — Progress Notes (Addendum)
Follow up at pt request.    Spoke with Abigail [name] about rehab.   She described not wishing to go to rehab because her s/o "Abigail [name]" is being released from prison.   States that she "wants to know where we stand."   Said "If Abigail [name] wants me to go to rehab, I'll go."   Additionally, "If he tells me that our relationship is over, I'll go to rehab because I won't have any other support."   Sees Abigail [name] as her support outside of Abigail [name].   Voices some oppositional feelings toward her mother and brother.  Does not want to go to rehab because mother and brother are the ones who want her to go.  Described feeling as though mother has never been affirming - - "all I wanted was for her to say she is proud of me, which she never has."  Because of this, Abigail [name] feels she wants to move toward sobriety "on my own."  Stating "I don't need their help."  Feels as though she wants to say "I did this on my own."   She states that neither mother, nor brother attended rehab for their chemical dependency and resents that they are asking her to attend rehab.  Abigail [name]'s brother described to her that he "has responsibilities like a wife and kids" and was not able to attend rehab.  Abigail [name] sees relationship with Abigail Sporelark as her "responsibility" and doesn't want to take time away from this.   She is briefly able to acknowledge that she can see how her family may be trying to help her by suggesting rehab, but moves back to defiant stance.    Abigail [name]'s brother is watching Abigail [name]'s 22 year old son, Abigail [name] (uncertain of custody).  Abigail [name] stated that brother told her she would not get son back without going to rehab.  This seems to increase her defiance and desire to "do it on my own." Stated that if she discharges without a plan for housing, she may return to "drug house" where she has stayed before.  When chaplain inquired as to whether this is a good plan, as it would be difficult to remain in recovery while staying in house with a dealer she replies that she feels she can  refrain from using while in this environment.    Abigail [name], Abigail [name] MDiv

## 2014-05-09 NOTE — [facility] Group Notes (Signed)
Adult Psychoeducational Group Note  Date:  05/09/2014 Time:  10:21 PM  Group Topic/Focus:  AA Meeting  Participation Level:  Active  Participation Quality:  Appropriate  Affect:  Appropriate  Cognitive:  Appropriate  Insight: Appropriate  Engagement in Group:  Engaged  Modes of Intervention:  Discussion and Education  Additional Comments:  [name] was engaged during group.  She talked about her future plans and how her mother and brother have written her off.  Caroll RancherLindsay, Pecola Haxton A 05/09/2014, 10:21 PM

## 2014-05-10 MED ORDER — METHOCARBAMOL 750 MG PO TABS
750.0000 mg | ORAL_TABLET | Freq: Two times a day (BID) | ORAL | Status: DC | PRN
  Administered 2014-05-10: 750 mg via ORAL
  Filled 2014-05-10: qty 1

## 2014-05-10 MED ORDER — QUETIAPINE FUMARATE 25 MG PO TABS
12.5000 mg | ORAL_TABLET | Freq: Three times a day (TID) | ORAL | Status: DC | PRN
  Administered 2014-05-10 – 2014-05-11 (×4): 12.5 mg via ORAL
  Filled 2014-05-10 (×4): qty 1

## 2014-05-10 MED ORDER — DICYCLOMINE HCL 20 MG PO TABS
20.0000 mg | ORAL_TABLET | Freq: Two times a day (BID) | ORAL | Status: DC | PRN
  Administered 2014-05-11: 20 mg via ORAL
  Filled 2014-05-10: qty 1

## 2014-05-10 NOTE — Progress Notes (Signed)
Attended group 

## 2014-05-10 NOTE — Progress Notes (Signed)
Patient ID: Abigail [name], female   DOB: [birth year], 23 y.o.   MRN: [id number] [facility] MD Progress Note  05/10/2014 3:02 PM Abigail [name]  MRN:  [id number] Subjective:    "I am super anxious, I do not feel safe leaving today"  Objective:  Patient discussed with staff and seen along with Social Worker today. Patient reports significant anxiety, particularly relating to disposition planning and disposition. She has been encouraged to consider going to a Rehab after discharge , but declines this option,which she admits is partially related to her wanting to be there when her boyfriend is released from jail in the near future, but also because " this is my life and I'm not going to do what my mom and my brother tell me to do " ( mother and brother came for family meeting and informed patient she needed to go to a Rehab before she could return to live with them)  At this time patient is in the process of calling friends to see if she can stay there. She states she has several sober friends who are supportive. She declined offer from SW for shelter or [facility] information. Patient endorses cravings for opiates but reports high motivation for sobriety and states she plans to " go to 90 meetings in 90 days" ( referring to 12 step meetings) On unit, patient has been active in milieu- seen  Interacting appropriately and laughing with peers, but seeking frequent support and reassurance from staff, and seeking PRNs often . She states she feels she is still having some ( although improved) symptoms of opiate withdrawal, but at this time describes mostly anxiety related to above stressors, and is not presenting with any physical discomfort or distress. Appetite is good and there has been no nausea or vomiting. Vitals are stable. Patient states she does not feel safe leaving at this time, and made vague remark she " would rather die than leave today", " I don't know what would happen If I left today" , but does not  endorse any active/ specific suicidal plan or intention. She states " once I know where I'm going I think I'll be OK"  Denies medication side effects. No self injurious or agitated/disruptive behaviors on unit   Duration- 30 minutes  .   Diagnosis:  Opiate Dependene, Opiate Withdrawal, Depression NOS   ADL's: improving  Sleep:improved  Appetite:   Fair   Suicidal Ideation:  Denies suicidal ideations Homicidal Ideation:  Denies  AEB (as evidenced by):  Psychiatric Specialty Exam: Physical Exam  Review of Systems  Constitutional: Positive for chills and malaise/fatigue. Negative for fever.  Respiratory: Positive for cough. Negative for shortness of breath.   Cardiovascular: Negative for chest pain.  Gastrointestinal: Positive for nausea. Negative for vomiting and diarrhea.  Genitourinary: Negative.   Musculoskeletal: Positive for myalgias.  Skin: Negative for itching and rash.  Neurological: Negative for headaches.  Psychiatric/Behavioral: Positive for depression and substance abuse. Negative for suicidal ideas.    Blood pressure 102/63, pulse 73, temperature 98.4 F (36.9 C), temperature source Oral, resp. rate 16, height 5\' 3"  (1.6 m), weight 65.318 kg (144 lb), last menstrual period 04/01/2014, SpO2 100.00%.Body mass index is 25.51 kg/(m^2).  General Appearance: grooming improved   Eye Contact::  Good  Speech:  Clear and Coherent  Volume:  Normal  Mood:  Anxious, but overall mood improved   Affect:  anxious, but variable. Noted to be appropriate and full in range with peers, more anxious with staff  Thought Process:  Goal Directed and Linear  Orientation:  Other:  fully alert and attentive  Thought Content:  no hallucinations, no delusions  Suicidal Thoughts:  No- as noted above, makes statements such as " would rather die than leaving unit today", but does not endorse any active suicidal plan or intention, and makes statement that once she has someone to go live with  after discharge she "will be OK"   Homicidal Thoughts:  No  Memory:  recent and remote grossly intact  Judgement:  Other:  fair   Insight:  Fair  Psychomotor Activity:  Normal  Concentration:  Good  Recall:  Good  Fund of Knowledge:Good  Language: Good  Akathisia:  Negative  Handed:  Right  AIMS (if indicated):     Assets:  Communication Skills Desire for Improvement Resilience  Sleep:  Number of Hours: 6.5   Musculoskeletal: Strength & Muscle Tone: within normal limits Gait & Station: normal Patient leans: N/A  Current Medications: Current Facility-Administered Medications  Medication Dose Route Frequency Provider Last Rate Last Dose  . acetaminophen (TYLENOL) tablet 650 mg  650 mg Oral Q6H PRN Kerry HoughSpencer E Simon, PA-C   650 mg at 05/06/14 1415  . alum & mag hydroxide-simeth (MAALOX/MYLANTA) 200-200-20 MG/5ML suspension 30 mL  30 mL Oral Q4H PRN Kerry HoughSpencer E Simon, PA-C      . dicyclomine (BENTYL) tablet 20 mg  20 mg Oral BID PRN Nehemiah MassedFernando Cobos, MD      . hydrOXYzine (ATARAX/VISTARIL) tablet 25 mg  25 mg Oral Q6H PRN Nehemiah MassedFernando Cobos, MD   25 mg at 05/10/14 1317  . magnesium hydroxide (MILK OF MAGNESIA) suspension 30 mL  30 mL Oral Daily PRN Kerry HoughSpencer E Simon, PA-C      . methocarbamol (ROBAXIN) tablet 750 mg  750 mg Oral Q12H PRN Nehemiah MassedFernando Cobos, MD      . nicotine (NICODERM CQ - dosed in mg/24 hours) patch 21 mg  21 mg Transdermal Q0600 Rachael FeeIrving A Lugo, MD   21 mg at 05/10/14 0748  . sertraline (ZOLOFT) tablet 100 mg  100 mg Oral Daily Nehemiah MassedFernando Cobos, MD   100 mg at 05/10/14 0747  . traZODone (DESYREL) tablet 200 mg  200 mg Oral QHS PRN Rachael FeeIrving A Lugo, MD   200 mg at 05/09/14 2153    Lab Results:  No results found for this or any previous visit (from the past 48 hour(s)).  Physical Findings: AIMS: Facial and Oral Movements Muscles of Facial Expression: None, normal Lips and Perioral Area: None, normal Jaw: None, normal Tongue: None, normal,Extremity Movements Upper (arms, wrists,  hands, fingers): None, normal Lower (legs, knees, ankles, toes): None, normal, Trunk Movements Neck, shoulders, hips: None, normal, Overall Severity Severity of abnormal movements (highest score from questions above): None, normal Incapacitation due to abnormal movements: None, normal Patient's awareness of abnormal movements (rate only patient's report): No Awareness, Dental Status Current problems with teeth and/or dentures?: No Does patient usually wear dentures?: No  CIWA:  CIWA-Ar Total: 1 COWS:  COWS Total Score: 6  Assessment:  Patient continues to present with significant anxiety related to disposition plan. She seems genuinely motivated in sobriety and concerned about possibility of relapsing,  But is declining going to an inpatient rehab after discharge, which mother and brother stated was a requirement for her to be able to return to live with them. As such, she is currently focusing on calling friends and acquaintances she states are sober in order to see if she can live  with them for the time being. She no longer appears to be in any acute withdrawal, but attributes anxiety to residual withdrawal.  Today makes vague passive suicidal statements in the context of her disposition planning, see above, but is not endorsing active suicidal ideations and is future oriented, making statements such as wanting to go to 90 meetings in 90 days and continuing college studies.   Treatment Plan Summary: Daily contact with patient to assess and evaluate symptoms and progress in treatment Medication management See below  Plan: Will start tapering PRN medications. Zoloft 100 mgrs QDAY Trazodone 200  mgrs QHS Patient refusing Rehab, but agreeing to IOP referral - currently working on disposition planning along with SW.   Medical Decision Making Problem Points:  Established problem, stable/improving (1), Review of last therapy session (1) and Review of psycho-social stressors (1) Data Points:   Review of medication regiment & side effects (2) Review of new medications or change in dosage (2)  I certify that inpatient services furnished can reasonably be expected to improve the patient's condition.   COBOS, FERNANDO 05/10/2014, 3:02 PM

## 2014-05-10 NOTE — [facility] Group Notes (Signed)
[facility] LCSW Group Therapy 05/10/2014  1:15 PM Type of Therapy: Group Therapy Participation Level: Active  Participation Quality: Attentive, Sharing and Supportive  Affect: Depressed and Flat  Cognitive: Alert and Oriented  Insight: Developing/Improving and Engaged  Engagement in Therapy: Developing/Improving and Engaged  Modes of Intervention: Clarification, Confrontation, Discussion, Education, Exploration, Limit-setting, Orientation, Problem-solving, Rapport Building, Dance movement psychotherapisteality Testing, Socialization and Support  Summary of Progress/Problems: The topic for group today was emotional regulation. This group focused on both positive and negative emotion identification and allowed group members to process ways to identify feelings, regulate negative emotions, and find healthy ways to manage internal/external emotions. Group members were asked to reflect on a time when their reaction to an emotion led to a negative outcome and explored how alternative responses using emotion regulation would have benefited them. Group members were also asked to discuss a time when emotion regulation was utilized when a negative emotion was experienced. Patient reported that she would like to have more control over her anxiety. CSW's and other group members provided patient with emotional support and encouragement.  Samuella BruinKristin Benedetta Sundstrom, MSW, [employer] Clinical Social Worker [facility] [phone]

## 2014-05-10 NOTE — Progress Notes (Signed)
Patient ID: Abigail PlyElizabeth A Clabaugh, female   DOB: 05/17/1991, 23 y.o.   MRN: [id number] She has been up and interacting with peers and staff has been observed talking on the phone often. Has requested and received prn medication for withdrawals , spasms  back and legs stomach cramps and anxiety,. Minutes she was observed laughing and joking with peers with no indications of discomfort.

## 2014-05-10 NOTE — Progress Notes (Signed)
Chaplain provided follow up support at pt request.  Abigail [name] was in good spirits following conference with MD and MSW where she learned she would be able to stay one more day.  Stated that he telephone conversation with her mother last evening "did not go well."  Described mother "writing me off."  Abigail [name] states that she is motivated to continue in recovery to regain custody of son.    Abigail [name], Abigail [name] Wayne MDiv

## 2014-05-10 NOTE — Clinical Social Work Note (Signed)
CSW attempted to contact brother Abigail [name] [phone] to update on discharge plans. No answer, CSW left voicemail. Awaiting return call.  Samuella BruinKristin Yashira Offenberger, MSW, [employer]CSWA Clinical Social Worker [facility] [phone]

## 2014-05-10 NOTE — [facility] Group Notes (Signed)
   [facility] Dis[facility] Note  05/10/2014  8:45 AM   Participation Quality: Alert, Appropriate and Oriented  Mood/Affect: Depressed and Flat  Depression Rating: 7  Anxiety Rating: 10  Thoughts of Suicide: Pt endorses some SI  Will you contract for safety? Yes  Current AVH: Pt denies  Plan for Discharge/Comments: Pt attended discharge planning group and actively participated in group. CSW provided pt with today's workbook. Patient reports that she continues to experience withdrawal symptoms and does not feel safe to discharge this afternoon. She feels that she will be ready to discharge tomorrow 05/11/14. She reports that she will not be going to [facility] and cannot stay with family. She is declining to stay at a local shelter. She is trying to contact 2 of her friends that she reports are sober and may let her stay with them. Discharge anticipated for tomorrow 05/11/14. She is agreeable to follow up with IOP and [facility]. Patient aware and agreeable to plan.  Transportation Means: Pt denies access to transportation at this time.  Supports: No supports mentioned at this time  Samuella BruinKristin Anaysia Germer, MSW, [employer]CSWA Clinical Social Worker [facility] [phone]

## 2014-05-11 DIAGNOSIS — F1123 Opioid dependence with withdrawal: Principal | ICD-10-CM

## 2014-05-11 DIAGNOSIS — F1994 Other psychoactive substance use, unspecified with psychoactive substance-induced mood disorder: Secondary | ICD-10-CM

## 2014-05-11 LAB — TSH: TSH: 1.2 u[IU]/mL (ref 0.350–4.500)

## 2014-05-11 MED ORDER — DICYCLOMINE HCL 20 MG PO TABS: 20.0000 mg | ORAL_TABLET | Freq: Two times a day (BID) | ORAL | Status: DC | PRN

## 2014-05-11 MED ORDER — ONDANSETRON 8 MG PO TBDP
8.0000 mg | ORAL_TABLET | Freq: Once | ORAL | Status: AC
  Administered 2014-05-11: 8 mg via ORAL

## 2014-05-11 MED ORDER — METHOCARBAMOL 750 MG PO TABS: 750.0000 mg | ORAL_TABLET | Freq: Two times a day (BID) | ORAL | Status: DC | PRN

## 2014-05-11 MED ORDER — ONDANSETRON 4 MG PO TBDP
ORAL_TABLET | ORAL | Status: AC
  Administered 2014-05-11: 8 mg via ORAL
  Filled 2014-05-11: qty 2

## 2014-05-11 NOTE — Progress Notes (Signed)
Patient ID: Abigail [name], female   DOB: 03/19/1991, 23 y.o.   MRN: [id number] Discharged to home with all belongings in no acute distress. Verbalized understanding of discharge instructions. Contracts for safety. Billy CoastGoodman, Gelila Well K, RN

## 2014-05-11 NOTE — Progress Notes (Signed)
The focus of this group is to educate the patient on the purpose and policies of crisis stabilization and provide a format to answer questions about their admission.  The group details unit policies and expectations of patients while admitted.  Patient attended 0900 nurse education orientation group this morning.  Patient actively participated, appropriate affect, alert, appropriate insight and engagement.  Today patient will work on goals for discharge.  

## 2014-05-11 NOTE — Progress Notes (Signed)
[facility] Discharge [facility] :  Will you be returning to the same living situation after discharge: No.  Patient is currently homeless. At discharge, do you have transportation home?:Yes,  patient will be provided with a bus pass [place] if she is unable to find her own transportation at discharge. Do you have the ability to pay for your medications:Yes,  patient will be provided with necessary medication samples and prescriptions at discharge  Release of information consent forms completed and in the chart;  Patient's signature needed at discharge.  Patient to Follow up at: Follow-up Information   [facility]. (Appt on this date for assessment for therapy and medication management services. Walk in between 10 am - 2pm. Please call office if you need to reschedule.)    Contact information:   [address] [place], [place] [address] Phone: [phone] Fax: [phone]      Patient denies SI/HI:   Yes,  denies, can contract for safety    Safety Planning and Suicide Prevention discussed:  Yes,  with patient and brother  Cristyn Crossno, [place] CarboKristin L 05/11/2014, 11:02 AM

## 2014-05-11 NOTE — Progress Notes (Signed)
D: Pt is anxious in affect and labile in mood. "Im still withdrawing". " It took me a month to detox while taking Suboxone". Pt was informed that she has been previously assessed by the psychiatrist and that the Librium protocol was not restarted.  Pt is aware of this but continues to request that this Clinical research associatewriter' speaks with the on-call physician. Pt is currently denying any SI/HI/AVH.  A: Writer spoke with the on-call provider about her request to restart on Librium. On-call provider does not want to restart librium at this time. On-call provider and writer is in agreement that this pt's symptoms can be treated with the current prn medications. Continued support and availability as needed was extended to this pt. Staff continue to monitor pt with q6515min checks.  R: No adverse drug reactions noted. Pt receptive to treatment. Pt remains safe at this time.

## 2014-05-11 NOTE — [facility] Suicide Risk Assessment (Signed)
Demographic Factors:  23 year old female, was recently homeless, has a 79 year old child, currently in custody of patient's brother  Total Time spent with patient: 30 minutes  Psychiatric Specialty Exam: Physical Exam  ROS  Blood pressure 134/79, pulse 99, temperature 98.3 F (36.8 C), temperature source Oral, resp. rate 18, height 5\' 3"  (1.6 m), weight 65.318 kg (144 lb), last menstrual period 04/01/2014, SpO2 100.00%.Body mass index is 25.51 kg/(m^2).  General Appearance: improved grooming  Eye Contact::  Good  Speech:  Normal Rate  Volume:  Normal  Mood:  improved , states she feels better.  Affect:  less labile, affect reactive  Thought Process:  Goal Directed and Linear  Orientation:  Full (Time, Place, and Person)  Thought Content:  denies hallucinations, no delusions, does  not appear internally preoccupied   Suicidal Thoughts:  No- denies any self injurious thoughts or any suicidal ideations, denies any homicidal or violent ideations  Homicidal Thoughts:  No  Memory:  recent and remote grossly intact   Judgement:  Fair  Insight:  Fair  Psychomotor Activity:  Normal  Concentration:  Good  Recall:  Good  Fund of Knowledge:Good  Language: Good  Akathisia:  Negative  Handed:  Right  AIMS (if indicated):     Assets:  Communication Skills Desire for Improvement Resilience  Sleep:  Number of Hours: 6.5    Musculoskeletal: Strength & Muscle Tone: within normal limits Gait & Station: normal Patient leans: N/A   Mental Status Per Nursing Assessment::   On Admission:  NA  Current Mental Status by Physician: At this time patient is improved compared to admission. She is not currently suicidal or homicidal , not psychotic, and is future oriented, looking forward to seeing her boyfriend soon, and on eventually continuing college studies Loss Factors: Boyfriend incarcerated, brother has custody of her son, limited support network, homelessness, as family members have  stated she cannot return there until she goes to a rehab setting, which she has declined to do.  Historical Factors: long history of opiate dependence  Risk Reduction Factors:   Responsible for children under 101 years of age, Sense of responsibility to family and Positive coping skills or problem solving skills  Continued Clinical Symptoms:  Patient presents with improvement in mood and affect. Not suicidal , not psychotic. Does describe ongoing vague symptoms of withdrawal such as some nausea. Good PO intake. (+) cravings, but states she is motivated in staying sober  Cognitive Features That Contribute To Risk:  Closed-mindedness    Suicide Risk:  Mild:  Suicidal ideation of limited frequency, intensity, duration, and specificity.  There are no identifiable plans, no associated intent, mild dysphoria and related symptoms, good self-control (both objective and subjective assessment), few other risk factors, and identifiable protective factors, including available and accessible social support.  Discharge Diagnoses:   AXIS I: Opiate Dependence, Opiate Withdrawal/improved, Depression NOS,consider substance induced  AXIS II:  Deferred AXIS III:   Past Medical History  Diagnosis Date  . Substance abuse    AXIS IV:  economic problems, housing problems, occupational problems and problems related to social environment AXIS V:  51-60 moderate symptoms (  60-65 upon discharge)   Plan Of Care/Follow-up recommendations:  Activity:  As tolerated Diet:  Regular Tests:  NA Other:  See below   Is patient on multiple antipsychotic therapies at discharge:  No   Has Patient had three or more failed trials of antipsychotic monotherapy by history:  No  Recommended Plan  for Multiple Antipsychotic Therapies: NA  Patient is leaving unit in good spirits. She has declined to go to a Rehab or to [facility] or [facility]ter. States she plans to stay with a friend for the time being. States she  plans to go to [employer] meetings daily. [facility].  [facility]. (Appt on this date for assessment for therapy and medication management services. Walk in between 10 am - 2pm. Please call office if you need to reschedule.)  Contact information:  [address]  [place], [place] [address]  Phone: [phone]  Fax: [phone]      Nehemiah MassedCOBOS, Aanshi Batchelder 05/11/2014, 2:13 PM

## 2014-05-11 NOTE — Clinical Social Work Note (Signed)
[facility] as well as for CSW to make referral to [facility] residential treatment program. Patient's brother had offered for her to live with him if she were to go into residential treatment but patient has declined. Patient reports that she does not have a place to stay but [place] [facility] At discharge to [facility] and medication management services, declining IOP at this time. Patient has declined to stay at a local shelter and feels that she will be able to find a friend to stay with. CSW informed patient that [employer] [facility] if she were able to have transportation from there but patient reports that she would prefer to have [facility].   Samuella BruinKristin Isael Stille, MSW, [employer] Clinical Social Worker [facility] [phone]

## 2014-05-11 NOTE — [facility] Group Notes (Signed)
[facility] LCSW Group Therapy 05/11/2014 1:15 PM Type of Therapy: Group Therapy Participation Level: Active  Participation Quality: Attentive, Sharing and Supportive  Affect: Depressed and Flat  Cognitive: Alert and Oriented  Insight: Developing/Improving and Engaged  Engagement in Therapy: Developing/Improving and Engaged  Modes of Intervention: Activity, Clarification, Confrontation, Discussion, Education, Exploration, Limit-setting, Orientation, Problem-solving, Rapport Building, Reality Testing, Socialization and Support  Summary of Progress/Problems: Patient was attentive and engaged with speaker from [employer]. Patient was attentive to speaker while they shared their story of dealing with mental health and overcoming it. Patient expressed interest in their programs and services and received information on their agency. Patient processed ways they can relate to the speaker.   Roselinda Bahena, MSW, LCSWA Clinical Social Worker [facility] [phone]   

## 2014-05-11 NOTE — Discharge Summary (Signed)
Physician Discharge Summary Note  Patient:  Abigail [name] is an 23 y.o., female MRN:  [id number] DOB:  1991-05-11 Patient phone:  [phone] (home)  Patient address:   [address] [place] [address],  Total Time spent with patient: 30 minutes  Date of Admission:  05/03/2014 Date of Discharge: 05/11/2014  Reason for Admission:  Substance induced mood disorder  Discharge Diagnoses: Active Problems:   Substance induced mood disorder   Psychiatric Specialty Exam: Physical Exam  Vitals reviewed. Psychiatric: She has a normal mood and affect. Her speech is normal and behavior is normal. Judgment and thought content normal. Cognition and memory are normal.    Review of Systems  Constitutional: Negative.   HENT: Negative.   Eyes: Negative.   Respiratory: Negative.   Cardiovascular: Negative.   Gastrointestinal: Negative.   Genitourinary: Negative.   Musculoskeletal: Negative.   Skin: Negative.   Neurological: Negative.   Endo/Heme/Allergies: Negative.   Psychiatric/Behavioral: Negative.     Blood pressure 134/79, pulse 99, temperature 98.3 F (36.8 C), temperature source Oral, resp. rate 18, height 5\' 3"  (1.6 m), weight 65.318 kg (144 lb), last menstrual period 04/01/2014, SpO2 100.00%.Body mass index is 25.51 kg/(m^2).   Past Psychiatric History: Diagnosis:  Substance induced mood disorder  Hospitalizations:  [facility] Adult Inpatient  Outpatient Care:  [facility], [place], [place]  Substance Abuse Care:  NA  Self-Mutilation:  NA  Suicidal Attempts:  NA  Violent Behaviors:  NA   Musculoskeletal: Strength & Muscle Tone: within normal limits Gait & Station: normal Patient leans: N/A  DSM5: Schizophrenia Disorders:  NA Obsessive-Compulsive Disorders:  NA Trauma-Stressor Disorders:  NA Substance/Addictive Disorders:  Substance induced mood disorder Depressive Disorders:  NA  Axis Diagnosis:   AXIS I:  Substance Induced Mood  Disorder AXIS II:  Deferred AXIS III:   Past Medical History  Diagnosis Date  . Substance abuse    AXIS IV:  economic problems, housing problems and other psychosocial or environmental problems AXIS V:  61-70 mild symptoms  Level of Care:  OP  Hospital Course:  Abigail [name] is a 23 year old caucasian female with a long history of polysubstance abuse (primarily opiates), buying drugs off the streets.  She was initially seen at [facility] and accepted and transferred to [facility] for further evaluation and treatment. She verbalized wanting detox. She had been on long term abuse of opioids from requiring pain medications for her back since she was a teenager. An MRI was obtained on this admission and findings were negative.   Her substance worsened due to boyfriend's incarceration and losing custody of her son.  She reports that she is homeless at the present time.    After further evaluation and patient interview. It was determined that Abigail [name] needed medication management to re-stabilize her moods and to help with her opioid dependence and need for detox.  She was started on and will be discharged on sertraline (ZOLOFT) tablet 100 mg for depression and traZODone (DESYREL) tablet 200 mg for sleep aid.  Additionally, she was given 2 day prescriptions for methocarbamol (ROBAXIN) tablet 750 mg as needed for musculoskeletal pain and dicyclomine (BENTYL) tablet 20 mg for abdominal cramping.    Abigail [name] also participated in groups to help develop better coping mechanisms.  Follow up appointments for outpatient care were arranged to continue at [facility], [place], [place].  She was encouraged to attend appointments made and to be compliant with her medications.  She verbalized understanding that in doing so, will help maintain her mental health and being.  Abigail [name] denied SI/HI/AVH at time of discharge.    Consults:  psychiatry  Significant Diagnostic Studies:  labs: Per  ED  Discharge Vitals:   Blood pressure 134/79, pulse 99, temperature 98.3 F (36.8 C), temperature source Oral, resp. rate 18, height 5\' 3"  (1.6 m), weight 65.318 kg (144 lb), last menstrual period 04/01/2014, SpO2 100.00%. Body mass index is 25.51 kg/(m^2). Lab Results:   No results found for this or any previous visit (from the past 72 hour(s)).  Physical Findings: AIMS: Facial and Oral Movements Muscles of Facial Expression: None, normal Lips and Perioral Area: None, normal Jaw: None, normal Tongue: None, normal,Extremity Movements Upper (arms, wrists, hands, fingers): None, normal Lower (legs, knees, ankles, toes): None, normal, Trunk Movements Neck, shoulders, hips: None, normal, Overall Severity Severity of abnormal movements (highest score from questions above): None, normal Incapacitation due to abnormal movements: None, normal Patient's awareness of abnormal movements (rate only patient's report): No Awareness, Dental Status Current problems with teeth and/or dentures?: No Does patient usually wear dentures?: No  CIWA:  CIWA-Ar Total: 1 COWS:  COWS Total Score: 7  Psychiatric Specialty Exam: See Psychiatric Specialty Exam and Suicide Risk Assessment completed by Attending Physician prior to discharge.  Discharge destination:  Home  Is patient on multiple antipsychotic therapies at discharge:  No   Has Patient had three or more failed trials of antipsychotic monotherapy by history:  No  Recommended Plan for Multiple Antipsychotic Therapies: NA     Medication List    STOP taking these medications       azithromycin 250 MG tablet  Commonly known as:  ZITHROMAX     cloNIDine 0.1 MG tablet  Commonly known as:  CATAPRES      TAKE these medications     Indication   sertraline 100 MG tablet  Commonly known as:  ZOLOFT  Take 1 tablet (100 mg total) by mouth daily.   Indication:  Major Depressive Disorder     traZODone 100 MG tablet  Commonly known as:   DESYREL  Take 2 tablets (200 mg total) by mouth at bedtime as needed for sleep (insomnia).   Indication:  Trouble Sleeping           Follow-up Information   Follow up with [facility] On 05/15/2014. (Appt on this date for assessment for therapy and medication management services. Walk in between 10 am - 2pm. Please call office if you need to reschedule.)    Contact information:   [address] [place], [place] [address] Phone: [phone] Fax: [phone]      Follow up with [facility] On 05/17/2014. (Appointment at 1:30 pm for initial assessment for Intensive Outpatient Services. Please call office if you need to reschedule.)    Contact information:   [address]  [address] [place] [address] [place], [place] [address] Phone: [phone]      Follow-up recommendations:  Activity:  As tolerated Diet:  As tolerated  Comments:  1.  Take all your medications as prescribed.              2.  Report any adverse side effects to outpatient provider.                       3.  Patient instructed to not use alcohol or illegal drugs while on prescription medicines.  4.  In the event of worsening symptoms, instructed patient to call 911, the crisis hotline or go to nearest emergency room for evaluation of symptoms.  Total Discharge Time:  Greater than 30 minutes.  SignedAdonis Brook: AGUSTIN, SHEILA 05/11/2014, 9:57 AM  Patient seen, Suicide Assessment Completed.  Disposition Plan Reviewed

## 2014-05-15 NOTE — Progress Notes (Signed)
Patient Discharge Instructions:  After Visit Summary (AVS):   Faxed to:  05/15/14 Discharge Summary Note:   Faxed to:  05/15/14 Psychiatric Admission Assessment Note:   Faxed to:  05/15/14 Suicide Risk Assessment - Discharge Assessment:   Faxed to:  05/15/14 Faxed/Sent to the Next Level Care provider:  05/15/14 [facility]ax[facility] to [facility] @ [phone]  Abigail ReddenSheena E [place], 05/15/2014, 3:51 PM

## 2014-08-19 ENCOUNTER — Encounter (HOSPITAL_COMMUNITY): Payer: Self-pay | Admitting: *Deleted

## 2014-08-19 ENCOUNTER — Emergency Department (HOSPITAL_COMMUNITY)
Admission: EM | Admit: 2014-08-19 | Discharge: 2014-08-19 | Disposition: A | Discharge status: Home / Self Care | Payer: Medicaid Other | Attending: Emergency Medicine | Admitting: Emergency Medicine

## 2014-08-19 ENCOUNTER — Emergency Department (HOSPITAL_COMMUNITY): Payer: Medicaid Other

## 2014-08-19 DIAGNOSIS — Z880 Allergy status to penicillin: Secondary | ICD-10-CM | POA: Diagnosis not present

## 2014-08-19 DIAGNOSIS — S199XXA Unspecified injury of neck, initial encounter: Secondary | ICD-10-CM | POA: Diagnosis not present

## 2014-08-19 DIAGNOSIS — Y998 Other external cause status: Secondary | ICD-10-CM | POA: Diagnosis not present

## 2014-08-19 DIAGNOSIS — Z720 Tobacco use: Secondary | ICD-10-CM | POA: Diagnosis not present

## 2014-08-19 DIAGNOSIS — S0990XA Unspecified injury of head, initial encounter: Secondary | ICD-10-CM | POA: Diagnosis present

## 2014-08-19 DIAGNOSIS — Y92410 Unspecified street and highway as the place of occurrence of the external cause: Secondary | ICD-10-CM | POA: Insufficient documentation

## 2014-08-19 DIAGNOSIS — S3992XA Unspecified injury of lower back, initial encounter: Secondary | ICD-10-CM | POA: Diagnosis not present

## 2014-08-19 DIAGNOSIS — S060X9A Concussion with loss of consciousness of unspecified duration, initial encounter: Secondary | ICD-10-CM | POA: Insufficient documentation

## 2014-08-19 DIAGNOSIS — Z91040 Latex allergy status: Secondary | ICD-10-CM | POA: Diagnosis not present

## 2014-08-19 DIAGNOSIS — Y9389 Activity, other specified: Secondary | ICD-10-CM | POA: Diagnosis not present

## 2014-08-19 DIAGNOSIS — Z3202 Encounter for pregnancy test, result negative: Secondary | ICD-10-CM | POA: Insufficient documentation

## 2014-08-19 DIAGNOSIS — S069X9A Unspecified intracranial injury with loss of consciousness of unspecified duration, initial encounter: Secondary | ICD-10-CM

## 2014-08-19 LAB — URINE MICROSCOPIC-ADD ON

## 2014-08-19 LAB — URINALYSIS, ROUTINE W REFLEX MICROSCOPIC
BILIRUBIN URINE: NEGATIVE
Glucose, UA: NEGATIVE mg/dL
Hgb urine dipstick: NEGATIVE
KETONES UR: NEGATIVE mg/dL
Nitrite: NEGATIVE
Protein, ur: NEGATIVE mg/dL
Specific Gravity, Urine: 1.01 (ref 1.005–1.030)
UROBILINOGEN UA: 0.2 mg/dL (ref 0.0–1.0)
pH: 7 (ref 5.0–8.0)

## 2014-08-19 LAB — POC URINE PREG, ED: Preg Test, Ur: NEGATIVE

## 2014-08-19 MED ORDER — ACETAMINOPHEN 500 MG PO TABS
1000.0000 mg | ORAL_TABLET | Freq: Once | ORAL | Status: AC
  Administered 2014-08-19: 1000 mg via ORAL
  Filled 2014-08-19: qty 2

## 2014-08-19 NOTE — Discharge Instructions (Signed)
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
## 2015-01-22 ENCOUNTER — Encounter (HOSPITAL_COMMUNITY): Payer: Self-pay | Admitting: Emergency Medicine

## 2015-01-22 ENCOUNTER — Emergency Department (HOSPITAL_COMMUNITY)
Admission: EM | Admit: 2015-01-22 | Discharge: 2015-01-22 | Disposition: A | Discharge status: Home / Self Care | Payer: MEDICAID | Attending: Emergency Medicine | Admitting: Emergency Medicine

## 2015-01-22 DIAGNOSIS — Z880 Allergy status to penicillin: Secondary | ICD-10-CM | POA: Insufficient documentation

## 2015-01-22 DIAGNOSIS — Z91040 Latex allergy status: Secondary | ICD-10-CM | POA: Insufficient documentation

## 2015-01-22 DIAGNOSIS — B9689 Other specified bacterial agents as the cause of diseases classified elsewhere: Secondary | ICD-10-CM

## 2015-01-22 DIAGNOSIS — N72 Inflammatory disease of cervix uteri: Secondary | ICD-10-CM | POA: Insufficient documentation

## 2015-01-22 DIAGNOSIS — Z3202 Encounter for pregnancy test, result negative: Secondary | ICD-10-CM | POA: Insufficient documentation

## 2015-01-22 DIAGNOSIS — Z79899 Other long term (current) drug therapy: Secondary | ICD-10-CM | POA: Insufficient documentation

## 2015-01-22 DIAGNOSIS — N760 Acute vaginitis: Secondary | ICD-10-CM | POA: Insufficient documentation

## 2015-01-22 DIAGNOSIS — Z720 Tobacco use: Secondary | ICD-10-CM | POA: Insufficient documentation

## 2015-01-22 LAB — CBC WITH DIFFERENTIAL/PLATELET
Basophils Absolute: 0.1 10*3/uL (ref 0.0–0.1)
Basophils Relative: 1 % (ref 0–1)
EOS ABS: 0.2 10*3/uL (ref 0.0–0.7)
Eosinophils Relative: 3 % (ref 0–5)
HCT: 36.3 % (ref 36.0–46.0)
Hemoglobin: 11.7 g/dL — ABNORMAL LOW (ref 12.0–15.0)
LYMPHS ABS: 3 10*3/uL (ref 0.7–4.0)
LYMPHS PCT: 31 % (ref 12–46)
MCH: 27.4 pg (ref 26.0–34.0)
MCHC: 32.2 g/dL (ref 30.0–36.0)
MCV: 85 fL (ref 78.0–100.0)
Monocytes Absolute: 0.6 10*3/uL (ref 0.1–1.0)
Monocytes Relative: 6 % (ref 3–12)
NEUTROS ABS: 5.8 10*3/uL (ref 1.7–7.7)
Neutrophils Relative %: 59 % (ref 43–77)
PLATELETS: 189 10*3/uL (ref 150–400)
RBC: 4.27 MIL/uL (ref 3.87–5.11)
RDW: 14.1 % (ref 11.5–15.5)
WBC: 9.7 10*3/uL (ref 4.0–10.5)

## 2015-01-22 LAB — URINE MICROSCOPIC-ADD ON

## 2015-01-22 LAB — COMPREHENSIVE METABOLIC PANEL
ALK PHOS: 78 U/L (ref 38–126)
ALT: 46 U/L (ref 14–54)
AST: 41 U/L (ref 15–41)
Albumin: 3.7 g/dL (ref 3.5–5.0)
Anion gap: 9 (ref 5–15)
BILIRUBIN TOTAL: 0.4 mg/dL (ref 0.3–1.2)
BUN: 12 mg/dL (ref 6–20)
CO2: 26 mmol/L (ref 22–32)
Calcium: 8.8 mg/dL — ABNORMAL LOW (ref 8.9–10.3)
Chloride: 103 mmol/L (ref 101–111)
Creatinine, Ser: 0.84 mg/dL (ref 0.44–1.00)
GFR calc non Af Amer: >60 mL/min (ref >60)
GLUCOSE: 90 mg/dL (ref 65–99)
POTASSIUM: 3.8 mmol/L (ref 3.5–5.1)
Sodium: 138 mmol/L (ref 135–145)
Total Protein: 7.8 g/dL (ref 6.5–8.1)

## 2015-01-22 LAB — URINALYSIS, ROUTINE W REFLEX MICROSCOPIC
Glucose, UA: NEGATIVE mg/dL
HGB URINE DIPSTICK: NEGATIVE
Leukocytes, UA: NEGATIVE
Nitrite: NEGATIVE
PH: 6 (ref 5.0–8.0)
Protein, ur: 30 mg/dL — AB
Specific Gravity, Urine: >1.03 — ABNORMAL HIGH (ref 1.005–1.030)
Urobilinogen, UA: 0.2 mg/dL (ref 0.0–1.0)

## 2015-01-22 LAB — WET PREP, GENITAL
Trich, Wet Prep: NONE SEEN
YEAST WET PREP: NONE SEEN

## 2015-01-22 LAB — PREGNANCY, URINE: Preg Test, Ur: NEGATIVE

## 2015-01-22 MED ORDER — AZITHROMYCIN 250 MG PO TABS
500.0000 mg | ORAL_TABLET | Freq: Once | ORAL | Status: AC
  Administered 2015-01-22: 500 mg via ORAL
  Filled 2015-01-22: qty 2

## 2015-01-22 MED ORDER — LIDOCAINE HCL (PF) 1 % IJ SOLN
INTRAMUSCULAR | Status: AC
  Filled 2015-01-22: qty 5

## 2015-01-22 MED ORDER — HYDROCODONE-ACETAMINOPHEN 5-325 MG PO TABS: 1.0000 | ORAL_TABLET | ORAL | Status: DC | PRN

## 2015-01-22 MED ORDER — SODIUM CHLORIDE 0.9 % IV BOLUS (SEPSIS)
1000.0000 mL | Freq: Once | INTRAVENOUS | Status: AC
  Administered 2015-01-22: 1000 mL via INTRAVENOUS

## 2015-01-22 MED ORDER — METRONIDAZOLE 500 MG PO TABS: 500.0000 mg | ORAL_TABLET | Freq: Two times a day (BID) | ORAL | Status: DC

## 2015-01-22 MED ORDER — MORPHINE SULFATE 4 MG/ML IJ SOLN
4.0000 mg | INTRAMUSCULAR | Status: DC | PRN
  Administered 2015-01-22 (×2): 4 mg via INTRAVENOUS
  Filled 2015-01-22 (×2): qty 1

## 2015-01-22 MED ORDER — CEFTRIAXONE SODIUM 250 MG IJ SOLR
250.0000 mg | Freq: Once | INTRAMUSCULAR | Status: AC
  Administered 2015-01-22: 250 mg via INTRAMUSCULAR
  Filled 2015-01-22: qty 250

## 2015-01-22 MED ORDER — ONDANSETRON HCL 4 MG/2ML IJ SOLN
4.0000 mg | Freq: Once | INTRAMUSCULAR | Status: AC
  Administered 2015-01-22: 4 mg via INTRAVENOUS
  Filled 2015-01-22: qty 2

## 2015-01-22 NOTE — ED Provider Notes (Signed)
CSN: [id number]     Arrival date & time 01/22/15  1507 History   First MD Initiated Contact with Patient 01/22/15 1625     Chief Complaint  Patient presents with  . Abdominal Pain     (Consider location/radiation/quality/duration/timing/severity/associated sxs/prior Treatment) HPI..... Right lower quadrant pain radiating to right flank since this morning described as aching. Slight white vaginal discharge. Last menstrual period 2 weeks ago. Patient is sexually active. No fever, sweats, chills, dysuria, vaginal bleeding  Past Medical History  Diagnosis Date  . Substance abuse    Past Surgical History  Procedure Laterality Date  . Bladder surgery    . Tonsillectomy    . Adenoidectomy     History reviewed. No pertinent family history. History  Substance Use Topics  . Smoking status: Current Every Day Smoker -- 1.50 packs/day for 7 years    Types: Cigarettes  . Smokeless tobacco: Not on file  . Alcohol Use: 3.6 - 4.8 oz/week    6-8 Cans of beer per week     Comment: occ weekends   OB History    No data available     Review of Systems  All other systems reviewed and are negative.     Allergies  Latex and Penicillins  Home Medications   Prior to Admission medications   Medication Sig Start Date End Date Taking? Authorizing Provider  dicyclomine (BENTYL) 20 MG tablet Take 1 tablet (20 mg total) by mouth 2 (two) times daily as needed for spasms (abdominal cramping). Patient not taking: Reported on 08/19/2014 05/11/14   Adonis Brook, NP  HYDROcodone-acetaminophen (NORCO/VICODIN) 5-325 MG per tablet Take 1-2 tablets by mouth every 4 (four) hours as needed. 01/22/15   Donnetta Hutching, MD  methocarbamol (ROBAXIN) 750 MG tablet Take 1 tablet (750 mg total) by mouth every 12 (twelve) hours as needed for muscle spasms. For muscular pain Patient not taking: Reported on 08/19/2014 05/11/14   Adonis Brook, NP  metroNIDAZOLE (FLAGYL) 500 MG tablet Take 1 tablet (500 mg total) by mouth 2  (two) times daily. 01/22/15   Donnetta Hutching, MD  sertraline (ZOLOFT) 100 MG tablet Take 1 tablet (100 mg total) by mouth daily. Patient not taking: Reported on 08/19/2014 05/09/14   Thermon Leyland, NP  traZODone (DESYREL) 100 MG tablet Take 2 tablets (200 mg total) by mouth at bedtime as needed for sleep (insomnia). Patient not taking: Reported on 08/19/2014 05/09/14   Thermon Leyland, NP   BP 116/71 mmHg  Pulse 67  Temp(Src) 98.7 F (37.1 C) (Oral)  Resp 16  Ht  (1.6 m)  Wt 145 lb (65.772 kg)  BMI 25.69 kg/m2  SpO2 97%  LMP 01/08/2015 Physical Exam  Constitutional: She is oriented to person, place, and time. She appears well-developed and well-nourished.  HENT:  Head: Normocephalic and atraumatic.  Eyes: Conjunctivae and EOM are normal. Pupils are equal, round, and reactive to light.  Neck: Normal range of motion. Neck supple.  Cardiovascular: Normal rate and regular rhythm.   Pulmonary/Chest: Effort normal and breath sounds normal.  Abdominal: Soft. Bowel sounds are normal.  Genitourinary:  Pelvic exam: External genitalia appear normal. Cervix is slightly tender with a white discharge. No adnexal tenderness.  Musculoskeletal: Normal range of motion.  Neurological: She is alert and oriented to person, place, and time.  Skin: Skin is warm and dry.  Psychiatric: She has a normal mood and affect. Her behavior is normal.  Nursing note and vitals reviewed.   ED Course  Procedures (including critical care time) Labs Review Labs Reviewed  WET PREP, GENITAL - Abnormal; Notable for the following:    Clue Cells Wet Prep HPF POC MANY (*)    WBC, Wet Prep HPF POC FEW (*)    All other components within normal limits  URINALYSIS, ROUTINE W REFLEX MICROSCOPIC (NOT AT [facility]) - Abnormal; Notable for the following:    Specific Gravity, Urine >1.030 (*)    Bilirubin Urine SMALL (*)    Ketones, ur TRACE (*)    Protein, ur 30 (*)    All other components within normal limits  URINE MICROSCOPIC-ADD  ON - Abnormal; Notable for the following:    Squamous Epithelial / LPF MANY (*)    Bacteria, UA MANY (*)    All other components within normal limits  CBC WITH DIFFERENTIAL/PLATELET - Abnormal; Notable for the following:    Hemoglobin 11.7 (*)    All other components within normal limits  COMPREHENSIVE METABOLIC PANEL - Abnormal; Notable for the following:    Calcium 8.8 (*)    All other components within normal limits  PREGNANCY, URINE  GC/CHLAMYDIA PROBE AMP ([place]) NOT AT [facility]    Imaging Review No results found.   EKG Interpretation None      MDM   Final diagnoses:  Cervicitis  Bacterial vaginosis    No acute abdomen. Will treat for cervicitis and bacterial vaginosis. Discharge medications Flagyl 500 mg and Durenda Age, MD 01/22/15 2040

## 2015-01-22 NOTE — ED Notes (Signed)
Patient complaining of abdominal pain starting this morning. Denies n/v/d.

## 2015-01-22 NOTE — Discharge Instructions (Signed)
Bacterial Vaginosis Bacterial vaginosis is a vaginal infection that occurs when the normal balance of bacteria in the vagina is disrupted. It results from an overgrowth of certain bacteria. This is the most common vaginal infection in women of childbearing age. Treatment is important to prevent complications, especially in pregnant women, as it can cause a premature delivery. CAUSES  Bacterial vaginosis is caused by an increase in harmful bacteria that are normally present in smaller amounts in the vagina. Several different kinds of bacteria can cause bacterial vaginosis. However, the reason that the condition develops is not fully understood. RISK FACTORS Certain activities or behaviors can put you at an increased risk of developing bacterial vaginosis, including:  Having a new sex partner or multiple sex partners.  Douching.  Using an intrauterine device (IUD) for contraception. Women do not get bacterial vaginosis from toilet seats, bedding, swimming pools, or contact with objects around them. SIGNS AND SYMPTOMS  Some women with bacterial vaginosis have no signs or symptoms. Common symptoms include:  Grey vaginal discharge.  A fishlike odor with discharge, especially after sexual intercourse.  Itching or burning of the vagina and vulva.  Burning or pain with urination. DIAGNOSIS  Your health care provider will take a medical history and examine the vagina for signs of bacterial vaginosis. A sample of vaginal fluid may be taken. Your health care provider will look at this sample under a microscope to check for bacteria and abnormal cells. A vaginal pH test may also be done.  TREATMENT  Bacterial vaginosis may be treated with antibiotic medicines. These may be given in the form of a pill or a vaginal cream. A second round of antibiotics may be prescribed if the condition comes back after treatment.  HOME CARE INSTRUCTIONS   Only take over-the-counter or prescription medicines as  directed by your health care provider.  If antibiotic medicine was prescribed, take it as directed. Make sure you finish it even if you start to feel better.  Do not have sex until treatment is completed.  Tell all sexual partners that you have a vaginal infection. They should see their health care provider and be treated if they have problems, such as a mild rash or itching.  Practice safe sex by using condoms and only having one sex partner. SEEK MEDICAL CARE IF:   Your symptoms are not improving after 3 days of treatment.  You have increased discharge or pain.  You have a fever. MAKE SURE YOU:   Understand these instructions.  Will watch your condition.  Will get help right away if you are not doing well or get worse. FOR MORE INFORMATION  Centers for Disease Control and Prevention, Division of STD Prevention: [employer] American Sexual Health Association (ASHA): www.ashastd.org  Document Released: 07/28/2005 Document Revised: 05/18/2013 Document Reviewed: 03/09/2013 [facility] Patient Information 2015 [place], [place]. This information is not intended to replace advice given to you by your health care provider. Make sure you discuss any questions you have with your health care provider.   We have treated in the emergency department for a cervical infection. He also have evidence of another infection called bacterial vaginosis. Antibiotic twice a day for this diagnosis. Pain medicine.

## 2015-01-23 LAB — GC/CHLAMYDIA PROBE AMP (~~LOC~~) NOT AT ARMC
CHLAMYDIA, DNA PROBE: NEGATIVE
Neisseria Gonorrhea: NEGATIVE

## 2015-02-10 ENCOUNTER — Encounter (HOSPITAL_COMMUNITY): Payer: Self-pay | Admitting: Emergency Medicine

## 2015-02-10 ENCOUNTER — Emergency Department (HOSPITAL_COMMUNITY)
Admission: EM | Admit: 2015-02-10 | Discharge: 2015-02-10 | Disposition: A | Discharge status: Home / Self Care | Payer: MEDICAID | Attending: Emergency Medicine | Admitting: Emergency Medicine

## 2015-02-10 DIAGNOSIS — G8929 Other chronic pain: Secondary | ICD-10-CM | POA: Insufficient documentation

## 2015-02-10 DIAGNOSIS — Z3202 Encounter for pregnancy test, result negative: Secondary | ICD-10-CM | POA: Insufficient documentation

## 2015-02-10 DIAGNOSIS — Z8659 Personal history of other mental and behavioral disorders: Secondary | ICD-10-CM | POA: Insufficient documentation

## 2015-02-10 DIAGNOSIS — Z79899 Other long term (current) drug therapy: Secondary | ICD-10-CM | POA: Insufficient documentation

## 2015-02-10 DIAGNOSIS — Z880 Allergy status to penicillin: Secondary | ICD-10-CM | POA: Insufficient documentation

## 2015-02-10 DIAGNOSIS — B9689 Other specified bacterial agents as the cause of diseases classified elsewhere: Secondary | ICD-10-CM

## 2015-02-10 DIAGNOSIS — Z91040 Latex allergy status: Secondary | ICD-10-CM | POA: Insufficient documentation

## 2015-02-10 DIAGNOSIS — N760 Acute vaginitis: Secondary | ICD-10-CM | POA: Insufficient documentation

## 2015-02-10 DIAGNOSIS — Z720 Tobacco use: Secondary | ICD-10-CM | POA: Insufficient documentation

## 2015-02-10 DIAGNOSIS — N72 Inflammatory disease of cervix uteri: Secondary | ICD-10-CM | POA: Insufficient documentation

## 2015-02-10 HISTORY — DX: Dorsalgia, unspecified: M54.9

## 2015-02-10 HISTORY — DX: Other psychoactive substance abuse, uncomplicated: F19.10

## 2015-02-10 HISTORY — DX: Other psychoactive substance use, unspecified with psychoactive substance-induced mood disorder: F19.94

## 2015-02-10 HISTORY — DX: Other chronic pain: G89.29

## 2015-02-10 LAB — WET PREP, GENITAL
Trich, Wet Prep: NONE SEEN
WBC, Wet Prep HPF POC: NONE SEEN
YEAST WET PREP: NONE SEEN

## 2015-02-10 LAB — URINALYSIS, ROUTINE W REFLEX MICROSCOPIC
Bilirubin Urine: NEGATIVE
Glucose, UA: NEGATIVE mg/dL
Ketones, ur: NEGATIVE mg/dL
Leukocytes, UA: NEGATIVE
NITRITE: NEGATIVE
PROTEIN: NEGATIVE mg/dL
Specific Gravity, Urine: 1.025 (ref 1.005–1.030)
UROBILINOGEN UA: 0.2 mg/dL (ref 0.0–1.0)
pH: 6 (ref 5.0–8.0)

## 2015-02-10 LAB — URINE MICROSCOPIC-ADD ON

## 2015-02-10 LAB — PREGNANCY, URINE: Preg Test, Ur: NEGATIVE

## 2015-02-10 MED ORDER — LIDOCAINE HCL (PF) 1 % IJ SOLN
INTRAMUSCULAR | Status: AC
  Administered 2015-02-10: 5 mL
  Filled 2015-02-10: qty 5

## 2015-02-10 MED ORDER — KETOROLAC TROMETHAMINE 60 MG/2ML IM SOLN
60.0000 mg | Freq: Once | INTRAMUSCULAR | Status: AC
  Administered 2015-02-10: 60 mg via INTRAMUSCULAR
  Filled 2015-02-10: qty 2

## 2015-02-10 MED ORDER — METRONIDAZOLE 500 MG PO TABS: 500.0000 mg | ORAL_TABLET | Freq: Two times a day (BID) | ORAL | Status: DC

## 2015-02-10 MED ORDER — CEFTRIAXONE SODIUM 250 MG IJ SOLR
250.0000 mg | Freq: Once | INTRAMUSCULAR | Status: AC
  Administered 2015-02-10: 250 mg via INTRAMUSCULAR
  Filled 2015-02-10: qty 250

## 2015-02-10 MED ORDER — NAPROXEN 250 MG PO TABS: 250.0000 mg | ORAL_TABLET | Freq: Two times a day (BID) | ORAL | Status: DC | PRN

## 2015-02-10 MED ORDER — AZITHROMYCIN 250 MG PO TABS
1000.0000 mg | ORAL_TABLET | Freq: Once | ORAL | Status: AC
  Administered 2015-02-10: 1000 mg via ORAL
  Filled 2015-02-10: qty 4

## 2015-02-10 MED ORDER — METRONIDAZOLE 500 MG PO TABS
500.0000 mg | ORAL_TABLET | Freq: Once | ORAL | Status: AC
  Administered 2015-02-10: 500 mg via ORAL
  Filled 2015-02-10: qty 1

## 2015-02-10 NOTE — ED Provider Notes (Signed)
CSN: [id number]     Arrival date & time 02/10/15  1503 History   First MD Initiated Contact with Patient 02/10/15 1539     Chief Complaint  Patient presents with  . Vaginal Discharge      HPI Pt was seen at 1550. Per pt, c/o gradual onset and persistence of constant vaginal discharge for the past 4 to 5 days. Pt states the discharge "was the same before when I was here." States she was tx with abx with improvement and "then it came back again after I just had my period." Has been associated with "aching" pelvic pain.  Denies dysuria/hematuria, no flank pain, no N/V/D, no fevers, no rash.    Past Medical History  Diagnosis Date  . Polysubstance abuse     opiods, cocaine, marijuana, heroin  . Chronic back pain   . Substance induced mood disorder    Past Surgical History  Procedure Laterality Date  . Bladder surgery    . Tonsillectomy    . Adenoidectomy      History  Substance Use Topics  . Smoking status: Current Every Day Smoker -- 1.50 packs/day for 7 years    Types: Cigarettes  . Smokeless tobacco: Not on file  . Alcohol Use: 3.6 - 4.8 oz/week    6-8 Cans of beer per week     Comment: occ weekends    Review of Systems ROS: Statement: All systems negative except as marked or noted in the HPI; Constitutional: Negative for fever and chills. ; ; Eyes: Negative for eye pain, redness and discharge. ; ; ENMT: Negative for ear pain, hoarseness, nasal congestion, sinus pressure and sore throat. ; ; Cardiovascular: Negative for chest pain, palpitations, diaphoresis, dyspnea and peripheral edema. ; ; Respiratory: Negative for cough, wheezing and stridor. ; ; Gastrointestinal: Negative for nausea, vomiting, diarrhea, abdominal pain, blood in stool, hematemesis, jaundice and rectal bleeding. ; ; Genitourinary:  Negative for dysuria, flank pain and hematuria. ; ; GYN:  No vaginal bleeding, +vaginal discharge, no vulvar pain. ;; Musculoskeletal: Negative for back pain and neck pain. Negative  for swelling and trauma.; ; Skin: Negative for pruritus, rash, abrasions, blisters, bruising and skin lesion.; ; Neuro: Negative for headache, lightheadedness and neck stiffness. Negative for weakness, altered level of consciousness , altered mental status, extremity weakness, paresthesias, involuntary movement, seizure and syncope.      Allergies  Latex and Penicillins  Home Medications   Prior to Admission medications   Medication Sig Start Date End Date Taking? Authorizing Provider  dicyclomine (BENTYL) 20 MG tablet Take 1 tablet (20 mg total) by mouth 2 (two) times daily as needed for spasms (abdominal cramping). Patient not taking: Reported on 08/19/2014 05/11/14   Adonis BrookSheila Agustin, NP  HYDROcodone-acetaminophen (NORCO/VICODIN) 5-325 MG per tablet Take 1-2 tablets by mouth every 4 (four) hours as needed. 01/22/15   Donnetta HutchingBrian Cook, MD  methocarbamol (ROBAXIN) 750 MG tablet Take 1 tablet (750 mg total) by mouth every 12 (twelve) hours as needed for muscle spasms. For muscular pain Patient not taking: Reported on 08/19/2014 05/11/14   Adonis BrookSheila Agustin, NP  metroNIDAZOLE (FLAGYL) 500 MG tablet Take 1 tablet (500 mg total) by mouth 2 (two) times daily. 01/22/15   Donnetta HutchingBrian Cook, MD  sertraline (ZOLOFT) 100 MG tablet Take 1 tablet (100 mg total) by mouth daily. Patient not taking: Reported on 08/19/2014 05/09/14   Thermon LeylandLaura A Davis, NP  traZODone (DESYREL) 100 MG tablet Take 2 tablets (200 mg total) by mouth at bedtime as  needed for sleep (insomnia). Patient not taking: Reported on 08/19/2014 05/09/14   Thermon Leyland, NP   LMP 01/08/2015 Physical Exam  1555: Physical examination:  Nursing notes reviewed; Vital signs and O2 SAT reviewed;  Constitutional: Well developed, Well nourished, Well hydrated, In no acute distress; Head:  Normocephalic, atraumatic; Eyes: EOMI, PERRL, No scleral icterus; ENMT: Mouth and pharynx normal, Mucous membranes moist; Neck: Supple, Full range of motion, No lymphadenopathy; Cardiovascular:  Regular rate and rhythm, No murmur, rub, or gallop; Respiratory: Breath sounds clear & equal bilaterally, No rales, rhonchi, wheezes.  Speaking full sentences with ease, Normal respiratory effort/excursion; Chest: Nontender, Movement normal; Abdomen: Soft, Nontender, Nondistended, Normal bowel sounds; Genitourinary: No CVA tenderness. Pelvic exam performed with permission of pt and female ED tech assist during exam.  External genitalia w/o lesions. Vaginal vault with copious thin white discharge.  Cervix w/o lesions, not friable, GC/chlam and wet prep obtained and sent to lab.  Bimanual exam with CMT and uterine tenderness, No adnexal tenderness.; Spine:  No midline CS, TS, LS tenderness. +TTP bilat lumbar paraspinal muscles. No rash.;; Extremities: Pulses normal, No tenderness, No edema, No calf edema or asymmetry.; Neuro: AA&Ox3, Major CN grossly intact.  Speech clear. No gross focal motor or sensory deficits in extremities. Climbs on and off stretcher easily by herself. Gait steady.; Skin: Color normal, Warm, Dry.   ED Course  Procedures      MDM  MDM Reviewed: previous chart, nursing note and vitals Reviewed previous: labs Interpretation: labs   Results for orders placed or performed during the hospital encounter of 02/10/15  Wet prep, genital  Result Value Ref Range   Yeast Wet Prep HPF POC NONE SEEN NONE SEEN   Trich, Wet Prep NONE SEEN NONE SEEN   Clue Cells Wet Prep HPF POC FEW (A) NONE SEEN   WBC, Wet Prep HPF POC NONE SEEN NONE SEEN  Pregnancy, urine  Result Value Ref Range   Preg Test, Ur NEGATIVE NEGATIVE  Urinalysis, Routine w reflex microscopic (not at [facility])  Result Value Ref Range   Color, Urine YELLOW YELLOW   APPearance CLOUDY (A) CLEAR   Specific Gravity, Urine 1.025 1.005 - 1.030   pH 6.0 5.0 - 8.0   Glucose, UA NEGATIVE NEGATIVE mg/dL   Hgb urine dipstick LARGE (A) NEGATIVE   Bilirubin Urine NEGATIVE NEGATIVE   Ketones, ur NEGATIVE NEGATIVE mg/dL   Protein, ur  NEGATIVE NEGATIVE mg/dL   Urobilinogen, UA 0.2 0.0 - 1.0 mg/dL   Nitrite NEGATIVE NEGATIVE   Leukocytes, UA NEGATIVE NEGATIVE  Urine microscopic-add on  Result Value Ref Range   Squamous Epithelial / LPF FEW (A) RARE   WBC, UA 0-2 <3 WBC/hpf   RBC / HPF 21-50 <3 RBC/hpf   Bacteria, UA FEW (A) RARE    1705:  GC/chlam from ED visit last month negative. Given CMT on exam, pt was re-cultured today and will tx for cervicitis and BV. Pt requesting "something stronger for pain." Pt has significant hx of polysubstance abuse, esp opiates, per EPIC chart review; will not rx narcotics at this time. Pt verb understanding and wants to go home now. Strongly encouraged to f/u with OB/GYN MD for good continuity of care. Dx and testing d/w pt.  Questions answered.  Verb understanding, agreeable to d/c home with outpt f/u.       Samuel Jester, DO 02/14/15 1044

## 2015-02-10 NOTE — ED Notes (Signed)
Waiting shot time.

## 2015-02-10 NOTE — Discharge Instructions (Signed)
°Emergency Department Resource Guide °1) Find a Doctor and Pay Out of Pocket °Although you won't have to find out who is covered by your insurance plan, it is a good idea to ask around and get recommendations. You will then need to call the office and see if the doctor you have chosen will accept you as a new patient and what types of options they offer for patients who are self-pay. Some doctors offer discounts or will set up payment plans for their patients who do not have insurance, but you will need to ask so you aren't surprised when you get to your appointment. ° °2) Contact Your Local Health Department °Not all health departments have doctors that can see patients for sick visits, but many do, so it is worth a call to see if yours does. If you don't know where your local health department is, you can check in your phone book. The CDC also has a tool to help you locate your state's health department, and many state websites also have listings of all of their local health departments. ° °3) Find a Walk-in Clinic °If your illness is not likely to be very severe or complicated, you may want to try a walk in clinic. These are popping up all over the country in pharmacies, drugstores, and shopping centers. They're usually staffed by nurse practitioners or physician assistants that have been trained to treat common illnesses and complaints. They're usually fairly quick and inexpensive. However, if you have serious medical issues or chronic medical problems, these are probably not your best option. ° °No Primary Care Doctor: °- Call [facility] Connect at  832-8000 - they can help you locate a primary care doctor that  accepts your insurance, provides certain services, etc. °- Physician Referral Service- 1-800-533-3463 ° °Chronic Pain Problems: °Organization         Address  Phone   Notes  °[facility]  (336) 297-2271 Patients need to be referred by their primary care doctor.  ° °Medication  Assistance: °Organization         Address  Phone   Notes  °[place] [facility] [address] °[place], [place] [address] (336) 641-8030 --Must be a resident of [place] °-- Must have NO insurance coverage whatsoever (no Medicaid/ Medicare, etc.) °-- The pt. MUST have a primary care doctor that directs their care regularly and follows them in the community °  °[facility]  (866) 331-1348   °[employer]  (888) 892-1162   ° °Agencies that provide inexpensive medical care: °Organization         Address  Phone   Notes  °[facility]  (336) 832-8035   °[facility]    (336) 832-7272   °[facility] [address] °[place], [place] [address] (336) 832-4777   °[facility] [address], °[place] (336) 271-4999   °[facility]    (336) 373-0678   °[facility]    (336) 272-1050   °[facility] ° [address], [place] Phone:  (336) 832-4444, Fax:  (336) 832-4440 Hours of Operation:  9 am - 6 pm, M-F.  Also accepts Medicaid/Medicare and self-pay.  °[facility] ° [address], [place] Phone: (336) 832-3150, Fax: (336) [phone]. Hours of Operation:  8:30 am - 5:30 pm, M-F.  Also accepts Medicaid and self-pay.  °[facility] [place] [address], [place] Phone: (336) 878-6027   °[facility] [address], [place], [place] (336)723-1848, Ext. 123 Mondays & Thursdays: 7-9 AM.  First 15 patients are seen on a first come, first serve basis. °  ° °Medicaid-accepting [place] Providers: ° °Organization         Address  Phone   Notes  °[facility] [address], [place] (336) 641-2100 Also accepts self-pay patients.  °[facility] [address], [place] ° (336) 856-9996   °[facility] [address], [place]  (336) 288-8857   °[facility] [address] [place] [address], [place] (336) 299-7000   °Veita Bland [address], [place]  ° (336) 373-1557 Only accepts [place] Access Medicaid patients after they have their name applied to their card.  ° °Self-Pay (no insurance) in [place]: ° °Organization         Address  Phone   Notes  °Sickle Cell Patients, [facility] [address], [place] (336) 832-1970   °[facility] [address], [place] (336) 832-4400   °[facility] ° [address], [place] (336) 992-4800   °[facility]/Dr. Osei-Bonsu ° [address] [place] [address], [place] or [address], [place] (336) 841-8500 Phone number for both [place] and [place] locations is the same.  °[facility] [address], [place] (336) 299-0000   °[facility] [address] [place] [address], [place] or [address] (336) 852-7530 °(336) 878-2260   °[facility] [address], [place] (336) 350-1642, phone; (336) 294-5005, fax Sees patients 1st and 3rd Saturday of every month.  Must not qualify for public or private insurance (i.e. Medicaid, Medicare, [place] Health Choice, Veterans' Benefits) • Household income should be no more than 200% of the poverty level •The clinic cannot treat you if you are pregnant or think you are pregnant • Sexually transmitted diseases are not treated at the clinic.  ° ° °Dental Care: °Organization         Address  Phone  Notes  °[place] [facility] [address], [place] (336) 641-6152 Accepts children up to age 21 who are enrolled in Medicaid or [place] Health Choice; pregnant women with a Medicaid card; and children who have applied for Medicaid or [place] Health Choice, but were declined, whose parents can pay a reduced fee at time of service.  °[place]  [facility] [place]  [address], [place] (336) 641-7733 Accepts children up to age 21 who are enrolled in Medicaid or [place] Health Choice; pregnant women with a Medicaid card; and children who have applied for Medicaid or [place] Health Choice, but were declined, whose parents can pay a reduced fee at time of service.  °[facility] ° [address], [place] (336) 641-4533 Patients are seen by appointment only. Walk-ins are not accepted. [facility] will see patients 18 years of age and older. °Monday - Tuesday (8am-5pm) °Most Wednesdays (8:30-5pm) °$30 per visit, cash only  °[facility] ° [address], [place] (336) 641-4533 Patients are seen by appointment only. Walk-ins are not accepted. [facility] will see patients 18 years of age and older. °One   Wednesday Evening (Monthly: Volunteer Based).  $30 per visit, cash only  °[facility]  (919) 537-3737 for adults; Children under age 4, call Graduate Pediatric Dentistry at (919) [phone]. Children aged 4-14, please call (919) [phone] to request a pediatric application. ° Dental services are provided in all areas of dental care including fillings, crowns and bridges, complete and partial dentures, implants, gum treatment, root canals, and extractions. Preventive care is also provided. Treatment is provided to both adults and children. °Patients are selected via a lottery and there is often a waiting list. °  °[facility] [address], °[place] ° (336) 763-8833 [email or web address] °  °[facility] [address], [place], [place] (336)723-1848, Ext. 123 Second and Fourth Thursday of each month, opens at 6:30 AM; Clinic ends at 9 AM.  Patients are seen on a first-come first-served basis, and a limited number are seen during each clinic.  ° °[facility] ° [address], [place], [place] (336) 723-7904    Eligibility Requirements °You must have lived in [place], [place], or [place] counties for at least the last three months. °  You cannot be eligible for state or federal sponsored healthcare insurance, including Veterans Administration, Medicaid, or Medicare. °  You generally cannot be eligible for healthcare insurance through your employer.  °  How to apply: °Eligibility screenings are held every Tuesday and Wednesday afternoon from 1:00 pm until 4:00 pm. You do not need an appointment for the interview!  °[facility] [address], [place], [place] 336-631-2330   °[facility]  336-342-8273   °[facility]  336-703-3100   °[facility]  336-570-6415   ° °Behavioral Health Resources in the Community: °Intensive Outpatient Programs °Organization         Address  Phone  Notes  °[facility] [address], [place], [place] 336-878-6098   °[facility] [address], [place], [place] 336-832-9800   °[facility] [address], [place], [place] ° 336-882-2125   °[facility] [address],  °[place], [place] 1-800-853-5163 or 336-641-4981   °Substance Abuse Resources °Organization         Address  Phone  Notes  °[facility]  336-882-2125   °[facility]  336-784-9470   °[facility]  336-285-9073   °[facility]  336-845-3988   °Residential & Outpatient Substance Abuse Program  1-800-659-3381   °Psychological Services °Organization         Address  Phone  Notes  °[facility]  336- 832-9600   °[facility]  336- 378-7881   °[facility] [address], [place] 1-800-853-5163 or 336-641-4981   ° °Mobile Crisis Teams °Organization         Address  Phone  Notes  °[facility], Mobile Crisis Care Unit  1-877-626-1772   °Assertive °[facility] ° [address]  [place], [place] 336-834-9664   °Sharon DeEsch [address] °[place] 336-554-5454   ° °Self-Help/Support Groups °Organization         Address  Phone             Notes  °[facility] of  - variety of support groups  336- 373-1402 Call for more information  °Narcotics Anonymous (NA), [facility] [address], °[place]  2 meetings at this location  ° °  Residential Treatment Programs Organization         Address  Phone  Notes  [facility] [address],    [place]  [phone]   [facility]  [address], [place] [phone], [place], [place] [phone]   [facility] [address], [place] [phone] Admissions: 8am-3pm M-F  [facility] [address]    [place], [place] [phone]   [facility] [address], [place], [place] [phone]   [facility] [address]  [place], [place] [phone]) [phone]   [facility] Intensive Outpatient [address], [place], [place] [phone]) [phone]   [facility] ([facility] [address]  [place], [place] [phone] or [phone]   [facility] ([facility] [address] [place], [place] [phone])[phone] Accepts Medicaid  [facility] [address]  [place] [phone] Substance Abuse/Addiction Treatment   [facility] Organization         Address  Phone  Notes  [facility]  939-139-7359(888) [phone]   Angie FavaJulie Brannon, PhD [address], [address] [place], [place]   ([phone](336) ([phone] or [phone](336) (848) [phone]   [facility]   [address] [place], [place] [phone](336) ([phone]   [facility] [address], [place], [place] [phone](336) [phone] Insurance/Medicaid/sponsorship through [facility]  [facility] [address]                                    [place], [place] [phone](336) [phone] Therapy/tele-psych/case    [facility] [address]   [place], [place] [phone](336) [phone]    Dr. Lolly MustacheArfeen  ([phone]) [phone](336) [phone]   [facility] [place]  [employer] [facility] 1) [address],  2) [address], [place] 3)  [address], [place] ([phone](336) [phone](336) [phone]  [phone](336) [phone]   [facility] Child [facility] (586)405-9011(336) 785-008-4057 or [phone](336) (325)[phone] (After Hours)      Take the prescriptions as directed.  Your gonorrhea and chlamydia culture is pending results, and you will receive a phone call in the next several days if it is positive.  However, you were treated empirically today with antibiotics for both gonorrhea and chlamydia.  Call your regular OB/GYN doctor on Monday to schedule a follow up appointment this week.  Return to the Emergency Department immediately if worsening.

## 2015-02-10 NOTE — ED Notes (Signed)
Pt reports lower back pain radiating to pelvis. Pt reports nausea, vaginal pain/discharge as well. Pt reports was diagnosed with bacterial infection a few weeks ago and px abx. Pt reports "it went away and now its back."

## 2015-02-13 LAB — GC/CHLAMYDIA PROBE AMP (~~LOC~~) NOT AT ARMC
CHLAMYDIA, DNA PROBE: NEGATIVE
Neisseria Gonorrhea: NEGATIVE

## 2015-06-24 ENCOUNTER — Emergency Department (HOSPITAL_COMMUNITY)
Admission: EM | Admit: 2015-06-24 | Discharge: 2015-06-24 | Disposition: A | Discharge status: Home / Self Care | Payer: Medicaid Other | Attending: Emergency Medicine | Admitting: Emergency Medicine

## 2015-06-24 ENCOUNTER — Encounter (HOSPITAL_COMMUNITY): Payer: Self-pay | Admitting: *Deleted

## 2015-06-24 DIAGNOSIS — Z91040 Latex allergy status: Secondary | ICD-10-CM | POA: Insufficient documentation

## 2015-06-24 DIAGNOSIS — O99351 Diseases of the nervous system complicating pregnancy, first trimester: Secondary | ICD-10-CM | POA: Insufficient documentation

## 2015-06-24 DIAGNOSIS — F4310 Post-traumatic stress disorder, unspecified: Secondary | ICD-10-CM | POA: Insufficient documentation

## 2015-06-24 DIAGNOSIS — F319 Bipolar disorder, unspecified: Secondary | ICD-10-CM | POA: Insufficient documentation

## 2015-06-24 DIAGNOSIS — O99711 Diseases of the skin and subcutaneous tissue complicating pregnancy, first trimester: Secondary | ICD-10-CM | POA: Insufficient documentation

## 2015-06-24 DIAGNOSIS — L42 Pityriasis rosea: Secondary | ICD-10-CM | POA: Insufficient documentation

## 2015-06-24 DIAGNOSIS — Z3A01 Less than 8 weeks gestation of pregnancy: Secondary | ICD-10-CM | POA: Insufficient documentation

## 2015-06-24 DIAGNOSIS — F419 Anxiety disorder, unspecified: Secondary | ICD-10-CM | POA: Insufficient documentation

## 2015-06-24 DIAGNOSIS — O99331 Smoking (tobacco) complicating pregnancy, first trimester: Secondary | ICD-10-CM | POA: Insufficient documentation

## 2015-06-24 DIAGNOSIS — F17210 Nicotine dependence, cigarettes, uncomplicated: Secondary | ICD-10-CM | POA: Insufficient documentation

## 2015-06-24 DIAGNOSIS — O99341 Other mental disorders complicating pregnancy, first trimester: Secondary | ICD-10-CM | POA: Insufficient documentation

## 2015-06-24 DIAGNOSIS — G8929 Other chronic pain: Secondary | ICD-10-CM | POA: Insufficient documentation

## 2015-06-24 DIAGNOSIS — Z880 Allergy status to penicillin: Secondary | ICD-10-CM | POA: Insufficient documentation

## 2015-06-24 DIAGNOSIS — Z79899 Other long term (current) drug therapy: Secondary | ICD-10-CM | POA: Insufficient documentation

## 2015-06-24 DIAGNOSIS — L210 Seborrhea capitis: Secondary | ICD-10-CM

## 2015-06-24 HISTORY — DX: Depression, unspecified: F32.A

## 2015-06-24 HISTORY — DX: Major depressive disorder, single episode, unspecified: F32.9

## 2015-06-24 HISTORY — DX: Anxiety disorder, unspecified: F41.9

## 2015-06-24 HISTORY — DX: Bipolar disorder, unspecified: F31.9

## 2015-06-24 HISTORY — DX: Post-traumatic stress disorder, unspecified: F43.10

## 2015-06-24 MED ORDER — TRIAMCINOLONE ACETONIDE 0.1 % EX CREA: 1.0000 "application " | TOPICAL_CREAM | Freq: Four times a day (QID) | CUTANEOUS | Status: DC | PRN

## 2015-06-24 NOTE — Discharge Instructions (Signed)
Pityriasis Rosea  Pityriasis rosea is a rash that usually appears on the trunk of the body. It may also appear on the upper arms and upper legs. It usually begins as a single patch, and then more patches begin to develop. The rash may cause mild itching, but it normally does not cause other problems. It usually goes away without treatment. However, it may take weeks or months for the rash to go away completely.  CAUSES  The cause of this condition is not known. The condition does not spread from person to person (is noncontagious).  RISK FACTORS  This condition is more likely to develop in young adults and children. It is most common in the spring and fall.  SYMPTOMS  The main symptom of this condition is a rash.  · The rash usually begins with a single oval patch that is larger than the ones that follow. This is called a herald patch. It generally appears a week or more before the rest of the rash appears.  · When more patches start to develop, they spread quickly on the trunk, back, and arms. These patches are smaller than the first one.  · The patches that make up the rash are usually oval-shaped and pink or red in color. They are usually flat, but they may sometimes be raised so that they can be felt with a finger. They may also be finely crinkled and have a scaly ring around the edge.  · The rash does not typically appear on areas of the skin that are exposed to the sun.  Most people who have this condition do not have other symptoms, but some have mild itching. In a few cases, a mild headache or body aches may occur before the rash appears and then go away.  DIAGNOSIS  Your health care provider may diagnose this condition by doing a physical exam and taking your medical history. To rule out other possible causes for the rash, the health care provider may order blood tests or take a skin sample from the rash to be looked at under a microscope.  TREATMENT  Usually, treatment is not needed for this condition. The  rash will probably go away on its own in 4-8 weeks. In some cases, a health care provider may recommend or prescribe medicine to reduce itching.  HOME CARE INSTRUCTIONS  · Take medicines only as directed by your health care provider.  · Avoid scratching the affected areas of skin.  · Do not take hot baths or use a sauna. Use only warm water when bathing or showering. Heat can increase itching.  SEEK MEDICAL CARE IF:  · Your rash does not go away in 8 weeks.  · Your rash gets much worse.  · You have a fever.  · You have swelling or pain in the rash area.  · You have fluid, blood, or pus coming from the rash area.     This information is not intended to replace advice given to you by your health care provider. Make sure you discuss any questions you have with your health care provider.     Document Released: 09/03/2001 Document Revised: 12/12/2014 Document Reviewed: 07/05/2014  Elsevier Interactive Patient Education ©2016 Elsevier Inc.

## 2015-06-24 NOTE — ED Notes (Signed)
Pt states she has rash on her left side, with dark pigmentation of her skin. Pt states her rash extends into her left scalp. Pt c/o itching.    Pt states she is pregnant and not gone to the doctor yet. LMP Sept 24, 2016

## 2015-06-24 NOTE — ED Provider Notes (Signed)
Multiple lesions to the back of the neck, bilateral neck, left side predominantly. There was a initial larger lesion on the left side, these are itchy, minimal scaling, appear like small patches. This is consistent with pityriasis rosea. Patient informed of likely diagnosis and treatment plan, she is in agreement with the plan. Well-appearing, no red flags for pathologic problems.  She is pregnant,  Medical screening examination/treatment/procedure(s) were conducted as a shared visit with non-physician practitioner(s) and myself.  I personally evaluated the patient during the encounter.  Clinical Impression:   Final diagnoses:  Pityriasis         Eber HongBrian Cardelia Sassano, MD 06/25/15 [id number]

## 2015-06-24 NOTE — ED Provider Notes (Signed)
History  By signing my name below, I, Karle PlumberJennifer Tensley, attest that this documentation has been prepared under the direction and in the presence of Arthor CaptainAbigail Breonna Gafford, PA-C. Electronically Signed: Karle PlumberJennifer Tensley, ED Scribe. 06/24/2015. 4:00 PM.  Chief Complaint  Patient presents with  . Rash   The history is provided by the patient and medical records. No language interpreter was used.    HPI Comments:  Abigail [name] is a 24 y.o. female who presents to the Emergency Department complaining of a several areas of itching rashes to her neck and left side that began approximately 1.5 weeks ago. She reports the rash on her neck extends into her scalp. She denies sleeping in any new places or beds although she states she was recently in jail less than one month ago. She denies any contacts with anyone with similar symptoms. Pt reports she is currently pregnant with LMP of 05/05/15 and has had no prenatal care thus far. She has not done anything to treat the symptoms. She denies modifying factors of the symptoms. She denies fever, chills, nausea or vomiting.  Past Medical History  Diagnosis Date  . Polysubstance abuse     opiods, cocaine, marijuana, heroin  . Chronic back pain   . Substance induced mood disorder (HCC)   . Bipolar 1 disorder (HCC)   . PTSD (post-traumatic stress disorder)   . Depression   . Anxiety    Past Surgical History  Procedure Laterality Date  . Bladder surgery    . Tonsillectomy    . Adenoidectomy     No family history on file. Social History  Substance Use Topics  . Smoking status: Current Every Day Smoker -- 1.50 packs/day for 7 years    Types: Cigarettes  . Smokeless tobacco: None  . Alcohol Use: 3.6 - 4.8 oz/week    6-8 Cans of beer per week     Comment: occ weekends   OB History    No data available     Review of Systems  Constitutional: Negative for fever and chills.  Gastrointestinal: Negative for nausea and vomiting.  Skin: Positive for rash.     Allergies  Latex and Penicillins  Home Medications   Prior to Admission medications   Medication Sig Start Date End Date Taking? Authorizing Provider  dicyclomine (BENTYL) 20 MG tablet Take 1 tablet (20 mg total) by mouth 2 (two) times daily as needed for spasms (abdominal cramping). Patient not taking: Reported on 08/19/2014 05/11/14   Adonis BrookSheila Agustin, NP  HYDROcodone-acetaminophen (NORCO/VICODIN) 5-325 MG per tablet Take 1-2 tablets by mouth every 4 (four) hours as needed. 01/22/15   Donnetta HutchingBrian Cook, MD  methocarbamol (ROBAXIN) 750 MG tablet Take 1 tablet (750 mg total) by mouth every 12 (twelve) hours as needed for muscle spasms. For muscular pain Patient not taking: Reported on 08/19/2014 05/11/14   Adonis BrookSheila Agustin, NP  metroNIDAZOLE (FLAGYL) 500 MG tablet Take 1 tablet (500 mg total) by mouth 2 (two) times daily. 02/10/15   Samuel JesterKathleen McManus, DO  naproxen (NAPROSYN) 250 MG tablet Take 1 tablet (250 mg total) by mouth 2 (two) times daily as needed for mild pain or moderate pain (take with food). 02/10/15   Samuel JesterKathleen McManus, DO  sertraline (ZOLOFT) 100 MG tablet Take 1 tablet (100 mg total) by mouth daily. Patient not taking: Reported on 08/19/2014 05/09/14   Thermon LeylandLaura A Davis, NP  traZODone (DESYREL) 100 MG tablet Take 2 tablets (200 mg total) by mouth at bedtime as needed for sleep (insomnia). Patient  not taking: Reported on 08/19/2014 05/09/14   Thermon Leyland, NP   Triage Vitals: BP 117/75 mmHg  Pulse 98  Temp(Src) 98.1 F (36.7 C) (Oral)  Resp 16  Ht  (1.575 m)  Wt 135 lb (61.236 kg)  BMI 24.69 kg/m2  SpO2 100%  LMP 05/05/2015 Physical Exam  Constitutional: She is oriented to person, place, and time. She appears well-developed and well-nourished.  HENT:  Head: Normocephalic and atraumatic.  Eyes: EOM are normal.  Neck: Normal range of motion.  Cardiovascular: Normal rate.   Pulmonary/Chest: Effort normal.  Musculoskeletal: Normal range of motion.  Neurological: She is alert and oriented to  person, place, and time.  Skin: Skin is warm and dry. Rash noted.  Several small anular eruptions over left abdomen and chest, along left neck and posterior scalp.  Psychiatric: She has a normal mood and affect. Her behavior is normal.  Nursing note and vitals reviewed.   ED Course  Procedures (including critical care time) DIAGNOSTIC STUDIES: Oxygen Saturation is 100% on RA, normal by my interpretation.   COORDINATION OF CARE: 3:45 PM- Pt verbalizes understanding and agrees to plan.  Medications - No data to display  Labs Review Labs Reviewed - No data to display  Imaging Review No results found. I have personally reviewed and evaluated these images and lab results as part of my medical decision-making.   EKG Interpretation None      MDM   Final diagnoses:  Pityriasis    Patient seen in shared visit with attending physician. Patient rash presentation consistent with pityriasis. D/c with topical symptomatic treatment. F/u in the next 2-3 days with ob/gyn Appears sage for d/c at this time.  I personally performed the services described in this documentation, which was scribed in my presence. The recorded information has been reviewed and is accurate.       Arthor Captain, PA-C 06/24/15 1702  Eber Hong, MD 06/25/15 [phone]

## 2015-06-28 ENCOUNTER — Other Ambulatory Visit: Payer: Self-pay | Admitting: Obstetrics & Gynecology

## 2015-06-28 DIAGNOSIS — O3680X0 Pregnancy with inconclusive fetal viability, not applicable or unspecified: Secondary | ICD-10-CM

## 2015-06-29 ENCOUNTER — Ambulatory Visit (INDEPENDENT_AMBULATORY_CARE_PROVIDER_SITE_OTHER): Payer: Medicaid Other

## 2015-06-29 DIAGNOSIS — Z3A01 Less than 8 weeks gestation of pregnancy: Secondary | ICD-10-CM | POA: Diagnosis not present

## 2015-06-29 DIAGNOSIS — O3680X0 Pregnancy with inconclusive fetal viability, not applicable or unspecified: Secondary | ICD-10-CM | POA: Diagnosis not present

## 2015-06-29 NOTE — Progress Notes (Addendum)
US 7+6wks single IUP w/YS,crl 13.98mm,pos fht 135bpm,normal ov's bilat,subchorionic hemorrhage 3.4 x 1.8 x 2.4cm (pt is NOT having bleeding at this time)

## 2015-07-11 ENCOUNTER — Encounter: Payer: Self-pay | Admitting: Adult Health

## 2015-07-17 ENCOUNTER — Encounter: Payer: Self-pay | Admitting: Adult Health

## 2015-07-17 ENCOUNTER — Other Ambulatory Visit (HOSPITAL_COMMUNITY)
Admission: RE | Admit: 2015-07-17 | Discharge: 2015-07-17 | Disposition: A | Discharge status: Home / Self Care | Payer: No Typology Code available for payment source | Source: Ambulatory Visit | Attending: Adult Health | Admitting: Adult Health

## 2015-07-17 ENCOUNTER — Ambulatory Visit (INDEPENDENT_AMBULATORY_CARE_PROVIDER_SITE_OTHER): Payer: Medicaid Other | Admitting: Adult Health

## 2015-07-17 VITALS — BP 120/58 | HR 88 | Wt 139.5 lb

## 2015-07-17 DIAGNOSIS — Z331 Pregnant state, incidental: Secondary | ICD-10-CM | POA: Diagnosis not present

## 2015-07-17 DIAGNOSIS — Z3A11 11 weeks gestation of pregnancy: Secondary | ICD-10-CM

## 2015-07-17 DIAGNOSIS — Z0283 Encounter for blood-alcohol and blood-drug test: Secondary | ICD-10-CM

## 2015-07-17 DIAGNOSIS — F1910 Other psychoactive substance abuse, uncomplicated: Secondary | ICD-10-CM | POA: Insufficient documentation

## 2015-07-17 DIAGNOSIS — O0990 Supervision of high risk pregnancy, unspecified, unspecified trimester: Secondary | ICD-10-CM | POA: Insufficient documentation

## 2015-07-17 DIAGNOSIS — N760 Acute vaginitis: Secondary | ICD-10-CM | POA: Diagnosis not present

## 2015-07-17 DIAGNOSIS — Z3481 Encounter for supervision of other normal pregnancy, first trimester: Secondary | ICD-10-CM

## 2015-07-17 DIAGNOSIS — Z113 Encounter for screening for infections with a predominantly sexual mode of transmission: Secondary | ICD-10-CM | POA: Insufficient documentation

## 2015-07-17 DIAGNOSIS — Z01419 Encounter for gynecological examination (general) (routine) without abnormal findings: Secondary | ICD-10-CM | POA: Insufficient documentation

## 2015-07-17 DIAGNOSIS — A499 Bacterial infection, unspecified: Secondary | ICD-10-CM

## 2015-07-17 DIAGNOSIS — O99321 Drug use complicating pregnancy, first trimester: Secondary | ICD-10-CM | POA: Diagnosis not present

## 2015-07-17 DIAGNOSIS — Z1389 Encounter for screening for other disorder: Secondary | ICD-10-CM | POA: Diagnosis not present

## 2015-07-17 DIAGNOSIS — N898 Other specified noninflammatory disorders of vagina: Secondary | ICD-10-CM | POA: Diagnosis not present

## 2015-07-17 DIAGNOSIS — B9689 Other specified bacterial agents as the cause of diseases classified elsewhere: Secondary | ICD-10-CM

## 2015-07-17 DIAGNOSIS — Z369 Encounter for antenatal screening, unspecified: Secondary | ICD-10-CM

## 2015-07-17 HISTORY — DX: Other psychoactive substance abuse, uncomplicated: F19.10

## 2015-07-17 HISTORY — DX: Other specified bacterial agents as the cause of diseases classified elsewhere: B96.89

## 2015-07-17 LAB — POCT URINALYSIS DIPSTICK
Blood, UA: NEGATIVE
Glucose, UA: NEGATIVE
KETONES UA: NEGATIVE
Leukocytes, UA: NEGATIVE
Nitrite, UA: NEGATIVE
Protein, UA: NEGATIVE

## 2015-07-17 LAB — POCT WET PREP (WET MOUNT)
Clue Cells Wet Prep Whiff POC: POSITIVE
WBC, Wet Prep HPF POC: POSITIVE

## 2015-07-17 NOTE — Patient Instructions (Signed)
First Trimester of Pregnancy The first trimester of pregnancy is from week 1 until the end of week 12 (months 1 through 3). A week after a sperm fertilizes an egg, the egg will implant on the wall of the uterus. This embryo will begin to develop into a baby. Genes from you and your partner are forming the baby. The female genes determine whether the baby is a boy or a girl. At 6-8 weeks, the eyes and face are formed, and the heartbeat can be seen on ultrasound. At the end of 12 weeks, all the baby's organs are formed.  Now that you are pregnant, you will want to do everything you can to have a healthy baby. Two of the most important things are to get good prenatal care and to follow your health care provider's instructions. Prenatal care is all the medical care you receive before the baby's birth. This care will help prevent, find, and treat any problems during the pregnancy and childbirth. BODY CHANGES Your body goes through many changes during pregnancy. The changes vary from woman to woman.   You may gain or lose a couple of pounds at first.  You may feel sick to your stomach (nauseous) and throw up (vomit). If the vomiting is uncontrollable, call your health care provider.  You may tire easily.  You may develop headaches that can be relieved by medicines approved by your health care provider.  You may urinate more often. Painful urination may mean you have a bladder infection.  You may develop heartburn as a result of your pregnancy.  You may develop constipation because certain hormones are causing the muscles that push waste through your intestines to slow down.  You may develop hemorrhoids or swollen, bulging veins (varicose veins).  Your breasts may begin to grow larger and become tender. Your nipples may stick out more, and the tissue that surrounds them (areola) may become darker.  Your gums may bleed and may be sensitive to brushing and flossing.  Dark spots or blotches (chloasma,  mask of pregnancy) may develop on your face. This will likely fade after the baby is born.  Your menstrual periods will stop.  You may have a loss of appetite.  You may develop cravings for certain kinds of food.  You may have changes in your emotions from day to day, such as being excited to be pregnant or being concerned that something may go wrong with the pregnancy and baby.  You may have more vivid and strange dreams.  You may have changes in your hair. These can include thickening of your hair, rapid growth, and changes in texture. Some women also have hair loss during or after pregnancy, or hair that feels dry or thin. Your hair will most likely return to normal after your baby is born. WHAT TO EXPECT AT YOUR PRENATAL VISITS During a routine prenatal visit:  You will be weighed to make sure you and the baby are growing normally.  Your blood pressure will be taken.  Your abdomen will be measured to track your baby's growth.  The fetal heartbeat will be listened to starting around week 10 or 12 of your pregnancy.  Test results from any previous visits will be discussed. Your health care provider may ask you:  How you are feeling.  If you are feeling the baby move.  If you have had any abnormal symptoms, such as leaking fluid, bleeding, severe headaches, or abdominal cramping.  If you are using any tobacco products,   including cigarettes, chewing tobacco, and electronic cigarettes.  If you have any questions. Other tests that may be performed during your first trimester include:  Blood tests to find your blood type and to check for the presence of any previous infections. They will also be used to check for low iron levels (anemia) and Rh antibodies. Later in the pregnancy, blood tests for diabetes will be done along with other tests if problems develop.  Urine tests to check for infections, diabetes, or protein in the urine.  An ultrasound to confirm the proper growth  and development of the baby.  An amniocentesis to check for possible genetic problems.  Fetal screens for spina bifida and Down syndrome.  You may need other tests to make sure you and the baby are doing well.  HIV (human immunodeficiency virus) testing. Routine prenatal testing includes screening for HIV, unless you choose not to have this test. HOME CARE INSTRUCTIONS  Medicines  Follow your health care provider's instructions regarding medicine use. Specific medicines may be either safe or unsafe to take during pregnancy.  Take your prenatal vitamins as directed.  If you develop constipation, try taking a stool softener if your health care provider approves. Diet  Eat regular, well-balanced meals. Choose a variety of foods, such as meat or vegetable-based protein, fish, milk and low-fat dairy products, vegetables, fruits, and whole grain breads and cereals. Your health care provider will help you determine the amount of weight gain that is right for you.  Avoid raw meat and uncooked cheese. These carry germs that can cause birth defects in the baby.  Eating four or five small meals rather than three large meals a day may help relieve nausea and vomiting. If you start to feel nauseous, eating a few soda crackers can be helpful. Drinking liquids between meals instead of during meals also seems to help nausea and vomiting.  If you develop constipation, eat more high-fiber foods, such as fresh vegetables or fruit and whole grains. Drink enough fluids to keep your urine clear or pale yellow. Activity and Exercise  Exercise only as directed by your health care provider. Exercising will help you:  Control your weight.  Stay in shape.  Be prepared for labor and delivery.  Experiencing pain or cramping in the lower abdomen or low back is a good sign that you should stop exercising. Check with your health care provider before continuing normal exercises.  Try to avoid standing for long  periods of time. Move your legs often if you must stand in one place for a long time.  Avoid heavy lifting.  Wear low-heeled shoes, and practice good posture.  You may continue to have sex unless your health care provider directs you otherwise. Relief of Pain or Discomfort  Wear a good support bra for breast tenderness.   Take warm sitz baths to soothe any pain or discomfort caused by hemorrhoids. Use hemorrhoid cream if your health care provider approves.   Rest with your legs elevated if you have leg cramps or low back pain.  If you develop varicose veins in your legs, wear support hose. Elevate your feet for 15 minutes, 3-4 times a day. Limit salt in your diet. Prenatal Care  Schedule your prenatal visits by the twelfth week of pregnancy. They are usually scheduled monthly at first, then more often in the last 2 months before delivery.  Write down your questions. Take them to your prenatal visits.  Keep all your prenatal visits as directed by your   health care provider. Safety  Wear your seat belt at all times when driving.  Make a list of emergency phone numbers, including numbers for family, friends, the hospital, and police and fire departments. General Tips  Ask your health care provider for a referral to a local prenatal education class. Begin classes no later than at the beginning of month 6 of your pregnancy.  Ask for help if you have counseling or nutritional needs during pregnancy. Your health care provider can offer advice or refer you to specialists for help with various needs.  Do not use hot tubs, steam rooms, or saunas.  Do not douche or use tampons or scented sanitary pads.  Do not cross your legs for long periods of time.  Avoid cat litter boxes and soil used by cats. These carry germs that can cause birth defects in the baby and possibly loss of the fetus by miscarriage or stillbirth.  Avoid all smoking, herbs, alcohol, and medicines not prescribed by  your health care provider. Chemicals in these affect the formation and growth of the baby.  Do not use any tobacco products, including cigarettes, chewing tobacco, and electronic cigarettes. If you need help quitting, ask your health care provider. You may receive counseling support and other resources to help you quit.  Schedule a dentist appointment. At home, brush your teeth with a soft toothbrush and be gentle when you floss. SEEK MEDICAL CARE IF:   You have dizziness.  You have mild pelvic cramps, pelvic pressure, or nagging pain in the abdominal area.  You have persistent nausea, vomiting, or diarrhea.  You have a bad smelling vaginal discharge.  You have pain with urination.  You notice increased swelling in your face, hands, legs, or ankles. SEEK IMMEDIATE MEDICAL CARE IF:   You have a fever.  You are leaking fluid from your vagina.  You have spotting or bleeding from your vagina.  You have severe abdominal cramping or pain.  You have rapid weight gain or loss.  You vomit blood or material that looks like coffee grounds.  You are exposed to [place] measles and have never had them.  You are exposed to fifth disease or chickenpox.  You develop a severe headache.  You have shortness of breath.  You have any kind of trauma, such as from a fall or a car accident.   This information is not intended to replace advice given to you by your health care provider. Make sure you discuss any questions you have with your health care provider.   Document Released: 07/22/2001 Document Revised: 08/18/2014 Document Reviewed: 06/07/2013 Elsevier Interactive Patient Education [employer]2016 [employer]vier Inc. Return in 1 day to see JVF Return in 2 weeks for IT/NT and see MD

## 2015-07-17 NOTE — Progress Notes (Signed)
Subjective:  Abigail [name] is a 24 y.o. G51P1011 Caucasian female at [redacted]w[redacted]d by [place] being seen today for her first obstetrical visit.  Her obstetrical history is significant for smoker and xanax use and history of THC and heroin use, last in September, for heroin, she is getting xanax off the street.  Pregnancy history fully reviewed.  Patient reports cramping. Denies vb, uti s/s, abnormal/malodorous vag d/c, or vulvovaginal itching/irritation.  BP 120/58 mmHg  Pulse 88  Wt 139 lb 8 oz (63.277 kg)  LMP 05/05/2015 (Exact Date)  HISTORY: OB History  Gravida Para Term Preterm AB SAB TAB Ectopic Multiple Living  # Outcome Date GA Lbr Len/2nd Weight Sex Delivery Anes PTL Lv  3 Current           2 SAB 10/15/11          1 Term 03/26/10 [redacted]w[redacted]d  8 lb 6 oz (3.799 kg) M Vag-Spont   Y     Past Medical History  Diagnosis Date  . Polysubstance abuse     opiods, cocaine, marijuana, heroin  . Chronic back pain   . Substance induced mood disorder (HCC)   . Bipolar 1 disorder (HCC)   . PTSD (post-traumatic stress disorder)   . Depression   . Anxiety   . PTSD (post-traumatic stress disorder)    Past Surgical History  Procedure Laterality Date  . Bladder surgery    . Tonsillectomy    . Adenoidectomy     Family History  Problem Relation Age of Onset  . Anxiety disorder Mother   . Miscarriages / [place] Mother   . Hypertension Father   . Heart disease Father   . Depression Father   . Bipolar disorder Father   . Thyroid disease Father   . Anxiety disorder Father   . Early death Father   . Depression Brother   . Heart disease Brother   . Hypertension Brother   . Diabetes Brother     borderline  . Anxiety disorder Brother   . Bipolar disorder Brother   . Thyroid disease Brother   . Diabetes Maternal Grandmother   . Heart disease Maternal Grandmother   . Diabetes Maternal Grandfather   . Heart disease Maternal Grandfather     Exam   System:     General:  Well developed & nourished, no acute distress   Skin: Warm & dry, normal coloration and turgor, no rashes   Neurologic: Alert & oriented, normal mood   Cardiovascular: Regular rate & rhythm   Respiratory: Effort & rate normal, LCTAB, acyanotic   Abdomen: Soft, non tender   Extremities: normal strength, tone   Pelvic Exam:    Perineum: Normal perineum   Vulva: Normal, no lesions   Vagina:  Normal mucosa, white discharge with odor, +clue cells and WBC    Cervix: Normal, bulbous, appears closed   Uterus: Normal size/shape/contour for GA   Thin prep pap smear with GC/CHL performed  FHR: 177  via doppler   Assessment:   Pregnancy: [id number] Patient Active Problem List   Diagnosis Date Noted  . Supervision of normal pregnancy, antepartum 07/17/2015  . Substance induced mood disorder (HCC) 05/03/2014    [redacted]w[redacted]d G3P1011 New OB visit     Plan:  Initial labs drawn Continue prenatal vitamins Problem list reviewed and updated Reviewed n/v relief measures and warning s/s to report Reviewed recommended weight gain based on pre-gravid BMI Encouraged  well-balanced diet Genetic Screening discussed Integrated Screen: requested Cystic fibrosis screening discussed requested Ultrasound discussed; fetal survey: requested Follow up in 2 weeks for IT/NT and see MD,  And see Dr Emelda FearFerguson in am to discuss drug use.  Adline PotterJennifer A. Vaanya Shambaugh, NP 07/17/2015 3:53 PM

## 2015-07-18 ENCOUNTER — Ambulatory Visit (INDEPENDENT_AMBULATORY_CARE_PROVIDER_SITE_OTHER): Payer: Medicaid Other | Admitting: Obstetrics and Gynecology

## 2015-07-18 ENCOUNTER — Telehealth: Payer: Self-pay | Admitting: Obstetrics and Gynecology

## 2015-07-18 ENCOUNTER — Encounter: Payer: Self-pay | Admitting: Obstetrics and Gynecology

## 2015-07-18 VITALS — BP 110/60 | HR 92 | Wt 141.0 lb

## 2015-07-18 DIAGNOSIS — Z331 Pregnant state, incidental: Secondary | ICD-10-CM | POA: Diagnosis not present

## 2015-07-18 DIAGNOSIS — O99341 Other mental disorders complicating pregnancy, first trimester: Secondary | ICD-10-CM

## 2015-07-18 DIAGNOSIS — F1910 Other psychoactive substance abuse, uncomplicated: Secondary | ICD-10-CM

## 2015-07-18 DIAGNOSIS — Z1389 Encounter for screening for other disorder: Secondary | ICD-10-CM

## 2015-07-18 NOTE — Patient Instructions (Signed)
[facility] [phone]

## 2015-07-18 NOTE — Telephone Encounter (Signed)
[facility], they are to contact pt.

## 2015-07-18 NOTE — Progress Notes (Signed)
Patient ID: Abigail [name], female   DOB: 1990/11/06, 24 y.o.   MRN: [id number]   High Risk Pregnancy Diagnosis(es):   Hx Polysubstance Abuse, Opioid Dependence, bipolar, PTSD.  [id number] [redacted]w[redacted]d Estimated Date of Delivery: 02/09/16     HPI: The patient is being seen today for ongoing management of Hx Polysubstance Abuse, Opioid Dependence. Today she requests help coping with recently increased PTSD, anxiety, and depression. She reports her PTSD has been exacerbated since her father died 2 years ago. It has re-flared up recently as her brother, who is in custody of her son, has threatened to keep her from visiting her son because her brother is upset she is pregnant again; he dislikes FOB. She reports she is still "triggered" by arguments she has had with her brother. Pt knows and uses the language of therapy fluently She is separated from FOB, who she has dated "off and on" for 4-5 years. Patient lives with her mother.  She states she has also done extremely well on suboxone, taking about 4 mg daily, and requests to go back on it. She also states she had taken Zoloft 100 mg qd in the past, which worked extremely well for her. She states that [facility], she was placed on rispiradone, which she did not like because it made her "feel like a zombie."  She reports she last saw her counselor 1 month ago; she was seeing him every 2 weeks. She has been taking Xanax boudht as street source,but has not had sufficient relief.  Patient had been going to [facility] in [place] past Patient had seen [facility] in the past. On [address], [place]               Phone # [phone], or [phone]  Patient reports good fetal movement, denies any bleeding and no rupture of membranes symptoms or regular contractions.   BP weight and urine results all reviewed and noted. Blood pressure 110/60, pulse 92, weight 141 lb (63.957 kg), last menstrual period 05/05/2015.  Fetal Surveillance Testing today:    Fundal Height:  n/a Fetal Heart rate:  n/a Edema:  n/a Urinalysis: Not done   Questions were answered.  Lab and sonogram results have been reviewed. Comments: not done   Assessment:  1.  Pregnancy at [redacted]w[redacted]d,  Estimated Date of Delivery: 02/09/16 :                          2.  PTSD, anxiety, depression                         3. Current xanax use                          Hx zoloft use with good effect                          Hx Suboxone use, pt requests restart      Medication(s) Plans:  Begin zoloft 50 qd x 1wk then 100 qd        Treatment Plan:  Will call [facility] or other referral for suboxone.  Follow up 2 d for appointment for high risk OB care  I spent 45 minutes with the visit, with >50% was spent in counseling and coordination of care. Message sent to [facility] re" pt's  desire to restart care there.  By signing my name below, I, Ronney LionSuzanne Le, attest that this documentation has been prepared under the direction and in the presence of Tilda BurrowJohn V Zach Tietje, MD. Electronically Signed: Ronney LionSuzanne Le, ED Scribe. 07/18/2015. 11:31 AM.   I personally performed the services described in this documentation, which was SCRIBED in my presence. The recorded information has been reviewed and considered accurate. It has been edited as necessary during review. Tilda BurrowFERGUSON,Ilani Otterson V, MD   (scribe attestation statement)

## 2015-07-18 NOTE — Progress Notes (Signed)
Pt here today to discuss drug use. Pt states that she will not be able to pee. Pt states that she has a headache and nauseated.

## 2015-07-19 LAB — URINE CULTURE

## 2015-07-19 LAB — CYTOLOGY - PAP

## 2015-07-20 ENCOUNTER — Telehealth: Payer: Self-pay | Admitting: *Deleted

## 2015-07-20 ENCOUNTER — Ambulatory Visit (INDEPENDENT_AMBULATORY_CARE_PROVIDER_SITE_OTHER): Payer: Medicaid Other | Admitting: Obstetrics and Gynecology

## 2015-07-20 VITALS — BP 118/62 | HR 76 | Wt 140.0 lb

## 2015-07-20 DIAGNOSIS — O99341 Other mental disorders complicating pregnancy, first trimester: Secondary | ICD-10-CM

## 2015-07-20 DIAGNOSIS — Z3A11 11 weeks gestation of pregnancy: Secondary | ICD-10-CM

## 2015-07-20 DIAGNOSIS — Z1389 Encounter for screening for other disorder: Secondary | ICD-10-CM

## 2015-07-20 DIAGNOSIS — N760 Acute vaginitis: Secondary | ICD-10-CM | POA: Diagnosis not present

## 2015-07-20 DIAGNOSIS — O99321 Drug use complicating pregnancy, first trimester: Secondary | ICD-10-CM | POA: Diagnosis not present

## 2015-07-20 DIAGNOSIS — Z331 Pregnant state, incidental: Secondary | ICD-10-CM

## 2015-07-20 DIAGNOSIS — O23591 Infection of other part of genital tract in pregnancy, first trimester: Secondary | ICD-10-CM | POA: Diagnosis not present

## 2015-07-20 LAB — POCT URINALYSIS DIPSTICK
Blood, UA: NEGATIVE
Blood, UA: NEGATIVE
GLUCOSE UA: NEGATIVE
Glucose, UA: NEGATIVE
Ketones, UA: NEGATIVE
Ketones, UA: NEGATIVE
LEUKOCYTES UA: NEGATIVE
Leukocytes, UA: NEGATIVE
NITRITE UA: NEGATIVE
NITRITE UA: NEGATIVE
Protein, UA: NEGATIVE
Protein, UA: NEGATIVE

## 2015-07-20 MED ORDER — SERTRALINE HCL 100 MG PO TABS: 100.0000 mg | ORAL_TABLET | Freq: Every day | ORAL | Status: DC

## 2015-07-20 MED ORDER — DOXYLAMINE-PYRIDOXINE 10-10 MG PO TBEC: 2.0000 | DELAYED_RELEASE_TABLET | Freq: Every day | ORAL | Status: DC

## 2015-07-20 MED ORDER — PROMETHAZINE HCL 25 MG PO TABS: 25.0000 mg | ORAL_TABLET | Freq: Four times a day (QID) | ORAL | Status: DC | PRN

## 2015-07-20 NOTE — Progress Notes (Signed)
Pt states that she has had some cramping on her left side that radiates to her middle abdomin before she vomits. Pt states that she has had some cramping. Pt states that Dr. Emelda FearFerguson didn't send in Rx for Zoloft. Pt states that Dr. Emelda FearFerguson was supposed to have called about Suboxone.

## 2015-07-20 NOTE — Telephone Encounter (Signed)
i am not writing for xanax, and pt is aware. Seen today

## 2015-07-20 NOTE — Progress Notes (Signed)
Patient ID: Abigail [name], female   DOB: Apr 09, 1991, 24 y.o.   MRN: [id number]   [facility] Visit  Patient name: Abigail [name] MRN [id number]  Date of birth: 04/08/1991  CC & HPI:  Abigail [name] is a 24 y.o. female G18P1011, with a history of polysubstance abuse, PTSD, bipolar disorder, depression and anxiety, presenting today for follow-up of medications. Pt was last seen 2 days ago for increased stress and discussion of Zoloft and Suboxone. She was referred to [facility], but has not yet followed up.   FOB (Josh) is present today. Pt's first child, from different father, is in the custody of her brother. Pt is under increased stress due to relationship with FOB, brother and conflict between brother/FOB. Per pt, FOB is scared about pregnancy and "keeps running away."  Pt to wait for treatment of BV until after first trimester. She reports mild suprapubic pain and cramping.   ROS:  A complete 10 system review of systems was obtained and all systems are negative except as noted in the HPI and PMH.   Pertinent History Reviewed:   Reviewed: Significant for polysubstance abuse, PTSD, bipolar disorder, depression and anxiety. Medical         Past Medical History  Diagnosis Date  . Polysubstance abuse     opiods, cocaine, marijuana, heroin  . Chronic back pain   . Substance induced mood disorder (HCC)   . Bipolar 1 disorder (HCC)   . PTSD (post-traumatic stress disorder)   . Depression   . Anxiety   . PTSD (post-traumatic stress disorder)   . BV (bacterial vaginosis) 07/17/2015  . Drug abuse 07/17/2015                              Surgical Hx:    Past Surgical History  Procedure Laterality Date  . Bladder surgery    . Tonsillectomy    . Adenoidectomy     Medications: Reviewed & Updated - see associated section                       Current outpatient prescriptions:  .  ALPRAZolam (XANAX) 1 MG tablet, Take 1 mg by mouth 4 (four) times daily., Disp: , Rfl:     Social History: Reviewed -  reports that she has been smoking Cigarettes.  She has a 7 pack-year smoking history. She has never used smokeless tobacco.  Objective Findings:  Vitals: Blood pressure 118/62, pulse 76, weight 140 lb (63.504 kg), last menstrual period 05/05/2015.  Physical Examination: General appearance - alert, well appearing, and in no distress and oriented to person, place, and time Mental status - alert, oriented to person, place, and time, normal mood, behavior, speech, dress, motor activity, and thought processes  FHR-166 bpm   Assessment & Plan:   A:  1. [id number]; [redacted]w[redacted]d 2 impulsive/addictive personality 2. Bacterial vaginosis to be treated after 1st trimester  P:  1. Pt has referral to [facility] for suboxone, pt encouraged to limit med use including suboxone 2. Will prescribe Zoloft and Diclegis  I spent 25 minutes with the visit with >than 50% spent in counseling and coordination of care.  By signing my name below, I, Gwenyth Ober, attest that this documentation has been prepared under the direction and in the presence of Tilda Burrow, MD. Electronically Signed: Gwenyth Ober, ED Scribe. 07/20/2015. 10:58 AM.  I personally performed the  services described in this documentation, which was SCRIBED in my presence. The recorded information has been reviewed and considered accurate. It has been edited as necessary during review. Tilda BurrowFERGUSON,Tavarion Babington V, MD

## 2015-07-23 NOTE — Telephone Encounter (Signed)
Pt informed Dr. Emelda FearFerguson will not refill Xanax. Pt verbalized understanding.

## 2015-07-24 ENCOUNTER — Emergency Department (HOSPITAL_COMMUNITY)
Admission: EM | Admit: 2015-07-24 | Discharge: 2015-07-24 | Disposition: A | Discharge status: Home / Self Care | Payer: Medicaid Other | Attending: Emergency Medicine | Admitting: Emergency Medicine

## 2015-07-24 ENCOUNTER — Encounter (HOSPITAL_COMMUNITY): Payer: Self-pay | Admitting: *Deleted

## 2015-07-24 DIAGNOSIS — F319 Bipolar disorder, unspecified: Secondary | ICD-10-CM | POA: Insufficient documentation

## 2015-07-24 DIAGNOSIS — Z3A11 11 weeks gestation of pregnancy: Secondary | ICD-10-CM | POA: Diagnosis not present

## 2015-07-24 DIAGNOSIS — O99351 Diseases of the nervous system complicating pregnancy, first trimester: Secondary | ICD-10-CM | POA: Insufficient documentation

## 2015-07-24 DIAGNOSIS — Z880 Allergy status to penicillin: Secondary | ICD-10-CM | POA: Insufficient documentation

## 2015-07-24 DIAGNOSIS — Z8619 Personal history of other infectious and parasitic diseases: Secondary | ICD-10-CM | POA: Diagnosis not present

## 2015-07-24 DIAGNOSIS — F419 Anxiety disorder, unspecified: Secondary | ICD-10-CM | POA: Diagnosis not present

## 2015-07-24 DIAGNOSIS — Z79899 Other long term (current) drug therapy: Secondary | ICD-10-CM | POA: Insufficient documentation

## 2015-07-24 DIAGNOSIS — G8929 Other chronic pain: Secondary | ICD-10-CM | POA: Diagnosis not present

## 2015-07-24 DIAGNOSIS — F17210 Nicotine dependence, cigarettes, uncomplicated: Secondary | ICD-10-CM | POA: Insufficient documentation

## 2015-07-24 DIAGNOSIS — O99341 Other mental disorders complicating pregnancy, first trimester: Secondary | ICD-10-CM | POA: Diagnosis not present

## 2015-07-24 DIAGNOSIS — O209 Hemorrhage in early pregnancy, unspecified: Secondary | ICD-10-CM | POA: Insufficient documentation

## 2015-07-24 DIAGNOSIS — O99331 Smoking (tobacco) complicating pregnancy, first trimester: Secondary | ICD-10-CM | POA: Diagnosis not present

## 2015-07-24 DIAGNOSIS — Z91040 Latex allergy status: Secondary | ICD-10-CM | POA: Diagnosis not present

## 2015-07-24 DIAGNOSIS — F4310 Post-traumatic stress disorder, unspecified: Secondary | ICD-10-CM | POA: Diagnosis not present

## 2015-07-24 DIAGNOSIS — Z3490 Encounter for supervision of normal pregnancy, unspecified, unspecified trimester: Secondary | ICD-10-CM

## 2015-07-24 LAB — PMP SCREEN PROFILE (10S), URINE
AMPHETAMINE SCRN UR: NEGATIVE ng/mL
BARBITURATE SCRN UR: NEGATIVE ng/mL
Benzodiazepine Screen, Urine: POSITIVE ng/mL
COCAINE(METAB.) SCREEN, URINE: NEGATIVE ng/mL
Cannabinoids Ur Ql Scn: POSITIVE ng/mL
Creatinine(Crt), U: 93.5 mg/dL (ref 20.0–300.0)
METHADONE SCREEN, URINE: NEGATIVE ng/mL
OPIATE SCRN UR: NEGATIVE ng/mL
OXYCODONE+OXYMORPHONE UR QL SCN: NEGATIVE ng/mL
PCP Scrn, Ur: NEGATIVE ng/mL
PROPOXYPHENE SCREEN: NEGATIVE ng/mL
Ph of Urine: 6.7 (ref 4.5–8.9)

## 2015-07-24 LAB — URINALYSIS, ROUTINE W REFLEX MICROSCOPIC
Bilirubin, UA: NEGATIVE
Glucose, UA: NEGATIVE
KETONES UA: NEGATIVE
Leukocytes, UA: NEGATIVE
Nitrite, UA: NEGATIVE
Protein, UA: NEGATIVE
RBC, UA: NEGATIVE
SPEC GRAV UA: 1.017 (ref 1.005–1.030)
Urobilinogen, Ur: 0.2 mg/dL (ref 0.2–1.0)
pH, UA: 7 (ref 5.0–7.5)

## 2015-07-24 LAB — CBC
HEMATOCRIT: 39.1 % (ref 34.0–46.6)
Hemoglobin: 12.7 g/dL (ref 11.1–15.9)
MCH: 28.2 pg (ref 26.6–33.0)
MCHC: 32.5 g/dL (ref 31.5–35.7)
MCV: 87 fL (ref 79–97)
NRBC: 0 % (ref 0–0)
Platelets: 230 10*3/uL (ref 150–379)
RBC: 4.51 x10E6/uL (ref 3.77–5.28)
RDW: 16 % — AB (ref 12.3–15.4)
WBC: 13.2 10*3/uL — ABNORMAL HIGH (ref 3.4–10.8)

## 2015-07-24 LAB — ANTIBODY SCREEN: Antibody Screen: NEGATIVE

## 2015-07-24 LAB — HIV ANTIBODY (ROUTINE TESTING W REFLEX): HIV SCREEN 4TH GENERATION: NONREACTIVE

## 2015-07-24 LAB — ABO/RH: Rh Factor: POSITIVE

## 2015-07-24 LAB — RPR: RPR Ser Ql: NONREACTIVE

## 2015-07-24 LAB — CYSTIC FIBROSIS MUTATION 97: Interpretation: NOT DETECTED

## 2015-07-24 LAB — HEPATITIS B SURFACE ANTIGEN: Hepatitis B Surface Ag: NEGATIVE

## 2015-07-24 LAB — VARICELLA ZOSTER ANTIBODY, IGG: Varicella zoster IgG: 395 index (ref >165)

## 2015-07-24 LAB — RUBELLA SCREEN: Rubella Antibodies, IGG: 5.67 index (ref >0.99)

## 2015-07-24 NOTE — Discharge Instructions (Signed)
Please read and follow all provided instructions.  Your diagnoses today include:  1. Vaginal bleeding in pregnancy, first trimester   2. Pregnancy     Tests performed today include:  Vital signs. See below for your results today.   Medications prescribed:   None  Home care instructions:  Follow any educational materials contained in this packet.  Follow-up instructions: Please follow-up with your primary care provider as needed for further evaluation of your symptoms.  Return instructions:   Please return to the Emergency Department if you experience worsening symptoms OR  - Fever (temperature greater than 101.73F)  - Bleeding that does not stop with holding pressure to the area    -Severe pain (please note that you may be more sore the day after your   accident)  - Chest Pain  - Difficulty breathing  - Severe nausea or vomiting  - Inability to tolerate food and liquids  - Passing out  - Skin becoming red around your wounds  - Change in mental status (confusion or lethargy)  - New numbness or weakness     Please return if you have any other emergent concerns.  Additional Information: Follow up with your OBGYN as soon as you can for a follow up on the vaginal bleeding.   Your vital signs today were: BP 124/78 mmHg   Pulse 59   Temp(Src) 98.3 F (36.8 C) (Oral)   Resp 15   Ht 5\' 3"  (1.6 m)   Wt 63.504 kg   BMI 24.81 kg/m2   SpO2 100%   LMP 05/05/2015 (Exact Date) If your blood pressure (BP) was elevated above 135/85 this visit, please have this repeated by your doctor within one month. ---------------

## 2015-07-24 NOTE — ED Provider Notes (Signed)
The patient is first trimester pregnancy, she reports some vaginal bleeding after intercourse, denies vaginal pain, has some nausea, states that she has Phenergan at home. On exam she has a soft nontender abdomen, she is in no distress. Please see physician assistant's note for pelvic exam.  Fetal heart tones found, minimal bleeding, patient informed of risk of threatened abortion. Recommended pelvic rest. She has informed us that she had an intrauterine pregnancy based on ultrasound.  Patient informed of need for close follow-up if bleeding continues  Medical screening examination/treatment/procedure(s) were conducted as a shared visit with non-physician practitioner(s) and myself.  I personally evaluated the patient during the encounter.  Clinical Impression:   Final diagnoses:  Pregnancy  Vaginal bleeding in pregnancy, first trimester         Eber HongBrian Kiara Keep, MD 07/25/15 1651

## 2015-07-24 NOTE — ED Notes (Signed)
Pt reporting bleeding following intercourse.  Pt reports being [redacted] weeks pregnant.  Pt reporting that bleeding is light spotting,

## 2015-07-24 NOTE — ED Provider Notes (Addendum)
CSN: [id number]     Arrival date & time 07/24/15  1931 History   First MD Initiated Contact with Patient 07/24/15 2115     Chief Complaint  Patient presents with  . Vaginal Bleeding   HPI  24 y.o. female G71P1011 with  has a past medical history of Polysubstance abuse; Chronic back pain; Substance induced mood disorder (HCC); Bipolar 1 disorder (HCC); PTSD (post-traumatic stress disorder); Depression; Anxiety; PTSD (post-traumatic stress disorder); BV (bacterial vaginosis) (07/17/2015); and Drug abuse (07/17/2015).presents to the Emergency Department today complaining of vaginal bleeding. Began after intercourse today. Reports "a handful" of blood that has slowly dissipated to spotting. Also states that she saw her OBGYN on the 7th and was diagnosed with BV. OB decided to withhold treatment due to pregnancy in first trimester. Reports pain in LLQ and RLQ, but has been around since the diagnosis of BV at her OBGYN office visit. Reports a formal US was done to confirm IUP. No fever, N/V/D. No other symptoms noted.        Past Medical History  Diagnosis Date  . Polysubstance abuse     opiods, cocaine, marijuana, heroin  . Chronic back pain   . Substance induced mood disorder (HCC)   . Bipolar 1 disorder (HCC)   . PTSD (post-traumatic stress disorder)   . Depression   . Anxiety   . PTSD (post-traumatic stress disorder)   . BV (bacterial vaginosis) 07/17/2015  . Drug abuse 07/17/2015   Past Surgical History  Procedure Laterality Date  . Bladder surgery    . Tonsillectomy    . Adenoidectomy     Family History  Problem Relation Age of Onset  . Anxiety disorder Mother   . Miscarriages / [place] Mother   . Hypertension Father   . Heart disease Father   . Depression Father   . Bipolar disorder Father   . Thyroid disease Father   . Anxiety disorder Father   . Early death Father   . Depression Brother   . Heart disease Brother   . Hypertension Brother   . Diabetes Brother    borderline  . Anxiety disorder Brother   . Bipolar disorder Brother   . Thyroid disease Brother   . Diabetes Maternal Grandmother   . Heart disease Maternal Grandmother   . Diabetes Maternal Grandfather   . Heart disease Maternal Grandfather    Social History  Substance Use Topics  . Smoking status: Current Every Day Smoker -- 1.00 packs/day for 7 years    Types: Cigarettes  . Smokeless tobacco: Never Used  . Alcohol Use: No     Comment: occ weekends; not now   OB History    Gravida Para Term Preterm AB TAB SAB Ectopic Multiple Living   Review of Systems  Constitutional: Negative for fever and fatigue.  Respiratory: Negative for shortness of breath.   Cardiovascular: Negative for chest pain.  Gastrointestinal: Positive for abdominal pain. Negative for nausea, vomiting, diarrhea and constipation.  Genitourinary: Positive for vaginal bleeding. Negative for dysuria, hematuria, vaginal discharge, vaginal pain and dyspareunia.  Neurological: Negative for syncope, weakness, numbness and headaches.    Allergies  Latex and Penicillins  Home Medications   Prior to Admission medications   Medication Sig Start Date End Date Taking? Authorizing Provider  ALPRAZolam Prudy Feeler) 1 MG tablet Take 1 mg by mouth 4 (four) times daily.    Historical Provider, MD  Doxylamine-Pyridoxine  10-10 MG TBEC Take 2 tablets by mouth at bedtime. Two tabs at bedtime, Increase by one tablet in the morning, and one at lunch, as needed. 07/20/15   Tilda BurrowJohn Ferguson V, MD  promethazine (PHENERGAN) 25 MG tablet Take 1 tablet (25 mg total) by mouth every 6 (six) hours as needed for nausea or vomiting. Use Diclegis first 07/20/15   Tilda BurrowJohn Ferguson V, MD  sertraline (ZOLOFT) 100 MG tablet Take 1 tablet (100 mg total) by mouth daily. 07/20/15   Tilda BurrowJohn Ferguson V, MD   BP 124/78 mmHg  Pulse 59  Temp(Src) 98.3 F (36.8 C) (Oral)  Resp 15  Ht 5\' 3"  (1.6 m)  Wt 63.504 kg  BMI 24.81 kg/m2  SpO2 100%  LMP  05/05/2015 (Exact Date) Physical Exam  Constitutional: She is oriented to person, place, and time. She appears well-developed and well-nourished.  HENT:  Head: Normocephalic and atraumatic.  Eyes: Pupils are equal, round, and reactive to light.  Neck: Normal range of motion. Neck supple.  Cardiovascular: Normal rate and regular rhythm.   Pulmonary/Chest: Effort normal and breath sounds normal.  Abdominal: Soft. There is tenderness.  Genitourinary: Pelvic exam was performed with patient supine. There is no rash, tenderness or injury on the right labia. There is no rash, tenderness or injury on the left labia. Cervix exhibits no motion tenderness and no discharge. Right adnexum displays no mass and no tenderness. Left adnexum displays no mass and no tenderness. There is bleeding in the vagina. No tenderness in the vagina. No signs of injury around the vagina. No vaginal discharge found.    Neurological: She is alert and oriented to person, place, and time.  Skin: Skin is warm and dry.  Psychiatric: She has a normal mood and affect. Her behavior is normal.   *A chaperone was present during the Pelvic Examination    ED Course  Procedures (including critical care time) Labs Review Labs Reviewed - No data to display Imaging Review No results found. I have personally reviewed and evaluated these images and lab results as part of my medical decision-making.   EKG Interpretation None     MDM  I have reviewed the relevant previous healthcare records. I obtained HPI from historian. Cases discussed with Attending Physician  ED Course: 10:06 PM- Pelvic and Doppler US performed (161bpm)    Assessment: 24y f no sig pmh presents with vaginal bleeding during first trimester. Hx is consistent with vaginal bleeding after intercourse. IUP confirmed from previous OBGYN visit. GC/Wet prep done at previous OBGYn visit. Doppler U/S showed fetal heart tones. Pelvic exam showed closed Os. Suspicion low  for miscarriage.   Disposition/Plan:  D/C home with follow up to OBGYN   Patient was discussed with Eber HongBrian Miller, MD     Final diagnoses:  None        Audry Piliyler Kellianne Ek, PA-C 07/25/15 0014  Eber HongBrian Miller, MD 07/25/15 1651  Audry Piliyler Sherman Lipuma, PA-C 07/25/15 1721  Eber HongBrian Miller, MD 07/26/15 [id number]

## 2015-07-25 ENCOUNTER — Encounter: Payer: Self-pay | Admitting: Obstetrics and Gynecology

## 2015-07-25 ENCOUNTER — Ambulatory Visit (INDEPENDENT_AMBULATORY_CARE_PROVIDER_SITE_OTHER): Payer: Medicaid Other | Admitting: Obstetrics and Gynecology

## 2015-07-25 VITALS — BP 120/74 | HR 54 | Wt 137.0 lb

## 2015-07-25 DIAGNOSIS — Z331 Pregnant state, incidental: Secondary | ICD-10-CM

## 2015-07-25 DIAGNOSIS — O99341 Other mental disorders complicating pregnancy, first trimester: Secondary | ICD-10-CM | POA: Diagnosis not present

## 2015-07-25 DIAGNOSIS — O99321 Drug use complicating pregnancy, first trimester: Secondary | ICD-10-CM | POA: Diagnosis not present

## 2015-07-25 DIAGNOSIS — Z1389 Encounter for screening for other disorder: Secondary | ICD-10-CM | POA: Diagnosis not present

## 2015-07-25 DIAGNOSIS — Z3A11 11 weeks gestation of pregnancy: Secondary | ICD-10-CM | POA: Diagnosis not present

## 2015-07-25 DIAGNOSIS — O23591 Infection of other part of genital tract in pregnancy, first trimester: Secondary | ICD-10-CM

## 2015-07-25 NOTE — Progress Notes (Signed)
Pt here today for follow up from the ED.

## 2015-07-25 NOTE — Progress Notes (Signed)
Patient ID: Abigail [name], female   DOB: 11/03/1990, 24 y.o.   MRN: [id number]  Work in for lower back pain and mild vaginal bleeding.   G3P1011 69101w4d Estimated Date of Delivery: 02/09/16  Blood pressure 120/74, pulse 54, weight 137 lb (62.143 kg), last menstrual period 05/05/2015.   refer to the ob flow sheet for FH and FHR, also BP, Wt, Urine results:negative  Patient denies any bleeding and no rupture of membranes symptoms or regular contractions. Patient complaints: Moderate lower back pain with radiation to the lower abdomen with associated intermittent vaginal spotting. Pt was seen in the ED for the same yesterday; had pelvic exam that showed closed os and US, which was normal (FHR 166). She also complains of mild nausea today. She denies constipation or diarrhea.   FHR- 158  Questions were answered. Assessment: LROB G3P1011 @ 64101w4d  Plan:  Continued routine obstetrical care. Advised pt to stay hydrated.   F/u in 4 weeks for routine prenatal care.    By signing my name below, I, Doreatha MartinEva Mathews, attest that this documentation has been prepared under the direction and in the presence of Tilda BurrowJohn Reon Hunley V, MD. Electronically Signed: Doreatha MartinEva Mathews, ED Scribe. 07/25/2015. 2:17 PM.  I personally performed the services described in this documentation, which was SCRIBED in my presence. The recorded information has been reviewed and considered accurate. It has been edited as necessary during review. Tilda BurrowFERGUSON,Trumaine Wimer V, MD

## 2015-07-31 ENCOUNTER — Other Ambulatory Visit: Payer: Self-pay | Admitting: Obstetrics and Gynecology

## 2015-07-31 DIAGNOSIS — Z3682 Encounter for antenatal screening for nuchal translucency: Secondary | ICD-10-CM

## 2015-08-01 ENCOUNTER — Encounter: Payer: Self-pay | Admitting: Obstetrics and Gynecology

## 2015-08-01 ENCOUNTER — Ambulatory Visit (INDEPENDENT_AMBULATORY_CARE_PROVIDER_SITE_OTHER): Payer: Medicaid Other

## 2015-08-01 DIAGNOSIS — Z36 Encounter for antenatal screening of mother: Secondary | ICD-10-CM | POA: Diagnosis not present

## 2015-08-01 DIAGNOSIS — Z3481 Encounter for supervision of other normal pregnancy, first trimester: Secondary | ICD-10-CM

## 2015-08-01 DIAGNOSIS — Z3682 Encounter for antenatal screening for nuchal translucency: Secondary | ICD-10-CM

## 2015-08-01 NOTE — Progress Notes (Signed)
US 12+4wks,measurement c/w dates,scanned for 30min and unable to complete NT because of fetal pos,normal ov's bilat,NB present,fhr 153 bpm,crl 64.561mm,pt will reschedule ultrasound for next wk.

## 2015-08-07 ENCOUNTER — Other Ambulatory Visit: Payer: Self-pay | Admitting: Obstetrics and Gynecology

## 2015-08-07 DIAGNOSIS — IMO0002 Reserved for concepts with insufficient information to code with codable children: Secondary | ICD-10-CM

## 2015-08-07 DIAGNOSIS — Z3682 Encounter for antenatal screening for nuchal translucency: Secondary | ICD-10-CM

## 2015-08-07 DIAGNOSIS — Z0489 Encounter for examination and observation for other specified reasons: Secondary | ICD-10-CM

## 2015-08-08 ENCOUNTER — Ambulatory Visit (INDEPENDENT_AMBULATORY_CARE_PROVIDER_SITE_OTHER): Payer: Medicaid Other | Admitting: Obstetrics and Gynecology

## 2015-08-08 ENCOUNTER — Ambulatory Visit (INDEPENDENT_AMBULATORY_CARE_PROVIDER_SITE_OTHER): Payer: Medicaid Other

## 2015-08-08 ENCOUNTER — Encounter: Payer: Self-pay | Admitting: Obstetrics and Gynecology

## 2015-08-08 VITALS — BP 100/60 | HR 96 | Wt 142.0 lb

## 2015-08-08 DIAGNOSIS — F1994 Other psychoactive substance use, unspecified with psychoactive substance-induced mood disorder: Secondary | ICD-10-CM | POA: Diagnosis not present

## 2015-08-08 DIAGNOSIS — Z1389 Encounter for screening for other disorder: Secondary | ICD-10-CM | POA: Diagnosis not present

## 2015-08-08 DIAGNOSIS — Z36 Encounter for antenatal screening of mother: Secondary | ICD-10-CM

## 2015-08-08 DIAGNOSIS — N760 Acute vaginitis: Secondary | ICD-10-CM

## 2015-08-08 DIAGNOSIS — B9689 Other specified bacterial agents as the cause of diseases classified elsewhere: Secondary | ICD-10-CM

## 2015-08-08 DIAGNOSIS — Z0373 Encounter for suspected fetal anomaly ruled out: Secondary | ICD-10-CM

## 2015-08-08 DIAGNOSIS — Z369 Encounter for antenatal screening, unspecified: Secondary | ICD-10-CM

## 2015-08-08 DIAGNOSIS — Z3481 Encounter for supervision of other normal pregnancy, first trimester: Secondary | ICD-10-CM

## 2015-08-08 DIAGNOSIS — IMO0002 Reserved for concepts with insufficient information to code with codable children: Secondary | ICD-10-CM

## 2015-08-08 DIAGNOSIS — Z3A14 14 weeks gestation of pregnancy: Secondary | ICD-10-CM | POA: Diagnosis not present

## 2015-08-08 DIAGNOSIS — A499 Bacterial infection, unspecified: Secondary | ICD-10-CM | POA: Diagnosis not present

## 2015-08-08 DIAGNOSIS — Z3490 Encounter for supervision of normal pregnancy, unspecified, unspecified trimester: Secondary | ICD-10-CM | POA: Insufficient documentation

## 2015-08-08 DIAGNOSIS — O99321 Drug use complicating pregnancy, first trimester: Secondary | ICD-10-CM | POA: Diagnosis not present

## 2015-08-08 DIAGNOSIS — O0991 Supervision of high risk pregnancy, unspecified, first trimester: Secondary | ICD-10-CM

## 2015-08-08 DIAGNOSIS — Z0489 Encounter for examination and observation for other specified reasons: Secondary | ICD-10-CM

## 2015-08-08 DIAGNOSIS — Z3682 Encounter for antenatal screening for nuchal translucency: Secondary | ICD-10-CM

## 2015-08-08 DIAGNOSIS — Z331 Pregnant state, incidental: Secondary | ICD-10-CM | POA: Diagnosis not present

## 2015-08-08 DIAGNOSIS — O99341 Other mental disorders complicating pregnancy, first trimester: Secondary | ICD-10-CM

## 2015-08-08 DIAGNOSIS — F1910 Other psychoactive substance abuse, uncomplicated: Secondary | ICD-10-CM

## 2015-08-08 LAB — POCT URINALYSIS DIPSTICK
Blood, UA: NEGATIVE
GLUCOSE UA: NEGATIVE
Glucose, UA: NEGATIVE
KETONES UA: NEGATIVE
LEUKOCYTES UA: NEGATIVE
Nitrite, UA: NEGATIVE
PROTEIN UA: NEGATIVE
PROTEIN UA: NEGATIVE

## 2015-08-08 MED ORDER — ALPRAZOLAM 1 MG PO TABS: 1.0000 mg | ORAL_TABLET | Freq: Four times a day (QID) | ORAL | Status: DC

## 2015-08-08 NOTE — Progress Notes (Signed)
US 13+4wks,FHR 145 bpm,NT 1.327mm,NB present,crl 71.438mm,normal ov's bilat,ant pl

## 2015-08-08 NOTE — Addendum Note (Signed)
Addended by: Tilda BurrowFERGUSON, Rayaan Garguilo V on: 08/08/2015 06:49 PM   Modules accepted: Level of Service

## 2015-08-08 NOTE — Assessment & Plan Note (Signed)
Treat after 12 wk

## 2015-08-08 NOTE — Addendum Note (Signed)
Addended by: Tilda BurrowFERGUSON, Damon Hargrove V on: 08/08/2015 06:14 PM   Modules accepted: Orders

## 2015-08-08 NOTE — Patient Instructions (Signed)
Keep appointment in one week, Call Friday to let me know if you are dealing well enough to get thru weekend.

## 2015-08-08 NOTE — Progress Notes (Signed)
High Risk Pregnancy Diagnosis(es):  IV drug Use,  Anxiety disorder, alleged PTSD with excuses for everything,   Blood pressure 100/60, pulse 96, weight 142 lb (64.411 kg), last menstrual period 05/05/2015. 5:15 pm at time of visit.  HPI: [id number] 5141w4d Estimated Date of Delivery: 02/09/16     Pt had NT/IT u/s and labs today. Pt reports she has relapsed in to IV drug use of street acquired Dilaudid 4 mg pills, injected. Pt also using PO xanax from the street as well. Pt resistant to conversion to Ativan , "doesn't work".  Pt hystrionic, intermittently tearful over her alleged PTSD and difficulties with Xmas holiday where she "got nothing" and could not see her son, (in custody of pt's brother). Pt is disparaging of support of FOB,(in the room and supportive) who "needs to grow up" in pt's words. I stopped the pt's harrangue.  Pt familiar with [facility] in [place], currently resistant to considering attendance. P    BP weight and urine results reviewed and notable for weight gain, pt had lost weight @last  visit when trying to go without her street xanax, and was withdrawing.. 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 I agreed thru weekend. Will [facility].  Treatment Plan:        1. [employer] contract for xanax, in return for no IV DU.                                    2. Will consult [facility] for care management assistance, consult message called to [facility] Iodine unavailable.  Follow up:           2 days by phone, for update, one weeks for  F/u appt. All questions were answered. Phone consult with Dr Dub MikesLugo, who  suggests [facility] tomorrow or Friday, in the morning, [phone], for assessment for discussion to find a better outpatient option. Pt unable to be contacted by phone. I spent over 45 minutes with the visit, with >50% was spent in counseling and coordination of care.

## 2015-08-10 LAB — MATERNAL SCREEN, INTEGRATED #1
Crown Rump Length: 71.8 mm
Gest. Age on Collection Date: 13 weeks
Maternal Age at EDD: 23.7 years
Nuchal Translucency (NT): 1.7 mm
Number of Fetuses: 1
PAPP-A VALUE: 943.2 ng/mL
Weight: 142 [lb_av]

## 2015-08-12 NOTE — L&D Delivery Note (Signed)
Ms. Abigail [name] is a 25 yo F [redacted]W[redacted]D, G3P1 who presents on 6/20 in [place].  Prenatal course has been complicated by minimal PNC, active polysubstance abuse, housing instability.  Pt progressed well with labor without augmentation and epidural in place.  At 0300 RN called due to variable decels noted on FHT that did not resolve w/ position changes. Fluid bolus started. BP's slightly lower since epidural. Midwive came to bedside. VE 9/100/+1, BBOW. Small amount of bright red blood noted concerning for abruption. AROM bloody fluid. Cervix reduced and pt pushed well for 13 minutes and successfully delivered female infant with nuccal x 1, reduced at perineum.  Infant placed on mother and had weak cry, cyanosis. Cord clamped and cut x 2 and infant taken to warmer. At min 5 continued to have tachypnea and weak cry.  Code called, NICU attending came to warmer and evaluated infant, due to persistent subcostal retraction and acrocyanosis transferred to NICU for chest X-ray. Unable to obtain arterial cord gas, so venous cord gas obtained.   Delivery Note At 3:28 AM a viable female was delivered with nuccal x1, reduced at perineum via Vaginal, Spontaneous Delivery (Presentation: ; Occiput Anterior).  APGAR: 6, ; weight 7 lb 3 oz (3260 g).   Placenta status: Intact, Spontaneous. 3x10 cm area of dark red adherent clot noted on edge of placenta C/W marginal abruption.  Pathology.  Cord: 3 vessels with the following complications: None.  Venous cord pH: 7.073. Acid-Base deficit 12.8.  Anesthesia: Epidural  Episiotomy: None Lacerations: Superficial, hemostatic Suture Repair: n/a Est. Blood Loss (mL): 300  Mom to postpartum.  Baby to NICU.  Abigail [name] 01/29/2016, 4:50 AM  I was present for the entire delivery of baby and placenta and inspection of perineum and agree with above.  [name], CNM 01/29/2016 5:09 AM

## 2015-08-15 ENCOUNTER — Encounter: Payer: Self-pay | Admitting: Women's Health

## 2015-08-15 ENCOUNTER — Ambulatory Visit (INDEPENDENT_AMBULATORY_CARE_PROVIDER_SITE_OTHER): Payer: Medicaid Other | Admitting: Obstetrics and Gynecology

## 2015-08-15 VITALS — BP 116/58 | HR 92 | Wt 140.0 lb

## 2015-08-15 DIAGNOSIS — O99322 Drug use complicating pregnancy, second trimester: Secondary | ICD-10-CM | POA: Diagnosis not present

## 2015-08-15 DIAGNOSIS — Z1389 Encounter for screening for other disorder: Secondary | ICD-10-CM

## 2015-08-15 DIAGNOSIS — O0993 Supervision of high risk pregnancy, unspecified, third trimester: Secondary | ICD-10-CM | POA: Diagnosis not present

## 2015-08-15 DIAGNOSIS — Z331 Pregnant state, incidental: Secondary | ICD-10-CM

## 2015-08-15 LAB — POCT URINALYSIS DIPSTICK
Blood, UA: NEGATIVE
GLUCOSE UA: NEGATIVE
KETONES UA: NEGATIVE
Leukocytes, UA: NEGATIVE
Nitrite, UA: NEGATIVE

## 2015-08-15 MED ORDER — ALPRAZOLAM 1 MG PO TABS: 1.0000 mg | ORAL_TABLET | Freq: Four times a day (QID) | ORAL | Status: DC

## 2015-08-16 ENCOUNTER — Telehealth: Payer: Self-pay | Admitting: Obstetrics and Gynecology

## 2015-08-16 LAB — PMP SCREEN PROFILE (10S), URINE
AMPHETAMINE SCRN UR: NEGATIVE ng/mL
Barbiturate Screen, Ur: NEGATIVE ng/mL
Benzodiazepine Screen, Urine: POSITIVE ng/mL
COCAINE(METAB.) SCREEN, URINE: POSITIVE ng/mL
Cannabinoids Ur Ql Scn: POSITIVE ng/mL
Creatinine(Crt), U: 198 mg/dL (ref 20.0–300.0)
METHADONE SCREEN, URINE: NEGATIVE ng/mL
OXYCODONE+OXYMORPHONE UR QL SCN: NEGATIVE ng/mL
Opiate Scrn, Ur: POSITIVE ng/mL
PCP SCRN UR: NEGATIVE ng/mL
PROPOXYPHENE SCREEN: NEGATIVE ng/mL
Ph of Urine: 8.3 (ref 4.5–8.9)

## 2015-08-16 NOTE — Telephone Encounter (Signed)
Pt contacted over UDS positive for Cocaine, Benzo's(known to be on), opiates (acknowledges injecting dilaudid) Pt tearfully denies cocaine use, incoherently mentions abortion, offered appt in a.m. To discuss management.

## 2015-08-17 ENCOUNTER — Telehealth: Payer: Self-pay | Admitting: Obstetrics and Gynecology

## 2015-08-17 NOTE — Telephone Encounter (Signed)
Messages left for both bryan and Pt.

## 2015-08-22 ENCOUNTER — Ambulatory Visit (INDEPENDENT_AMBULATORY_CARE_PROVIDER_SITE_OTHER): Payer: Medicaid Other | Admitting: Obstetrics and Gynecology

## 2015-08-22 ENCOUNTER — Encounter: Payer: Self-pay | Admitting: Obstetrics and Gynecology

## 2015-08-22 VITALS — BP 120/80 | HR 74 | Wt 143.5 lb

## 2015-08-22 DIAGNOSIS — O99322 Drug use complicating pregnancy, second trimester: Secondary | ICD-10-CM | POA: Diagnosis not present

## 2015-08-22 DIAGNOSIS — Z1389 Encounter for screening for other disorder: Secondary | ICD-10-CM | POA: Diagnosis not present

## 2015-08-22 DIAGNOSIS — Z331 Pregnant state, incidental: Secondary | ICD-10-CM | POA: Diagnosis not present

## 2015-08-22 DIAGNOSIS — F1910 Other psychoactive substance abuse, uncomplicated: Secondary | ICD-10-CM

## 2015-08-22 DIAGNOSIS — O0992 Supervision of high risk pregnancy, unspecified, second trimester: Secondary | ICD-10-CM | POA: Diagnosis not present

## 2015-08-22 LAB — POCT URINALYSIS DIPSTICK
Blood, UA: NEGATIVE
GLUCOSE UA: NEGATIVE
KETONES UA: NEGATIVE
Leukocytes, UA: NEGATIVE
Nitrite, UA: NEGATIVE
Protein, UA: 1

## 2015-08-22 NOTE — Progress Notes (Signed)
Pt states that she still has a cold, head congestion.

## 2015-08-22 NOTE — Progress Notes (Signed)
Patient ID: Abigail [name], female   DOB: 11/08/1990, 25 y.o.   MRN: [id number]  Blood pressure 120/80, pulse 74, weight 143 lb 8 oz (65.091 kg), last menstrual period 05/05/2015.    High Risk Pregnancy Diagnosis(es):  IV drug Use, Anxiety disorder, alleged PTSD with excuses for everything, histrionic personality disorder   G3P1011 5041w4d Estimated Date of Delivery: 02/09/16   HPI: The patient is being seen today for ongoing management of high risk pregnancy secondary to IV DU and anxiety disorder.  Patient reports too early for fetal movement, denies any bleeding and no rupture of membranes symptoms or regular contractions.  Pt here with support person, Abigail [name], and gave permission for her and the scribe to be in the room during the discussion.   Pt was last seen in the office on 08/08/15 and endorsed IV street acquired Dilaudid and Xanax use. She was prescribed a one week contract of Xanax in exchange for no IV DU. Pt was resistant to [facility] attendance. She reports today that she has been taking Suboxone. She states she has not contacted behavioral health due to having a cold. She notes she has called the [facility] twice, but has not received a call back. Support person expresses desire to help pt cope with her addiction and make phone calls or set up appointments. Pt states "I would consider it, but not really" when asked today if she would consider [facility] or Behavioral Health. She states, "I would need to talk to my family about it", and is resistant to calling them while in the office or bringing them to an appointment for discussion. She also states that she is considering terminating the pregnancy, but "doesn't believe in it".   Today she also complains of nasal congestion, rhinorrhea and cough for 2 weeks.    Discussed with pt voluntarily admitting [facility], and termination options. At end of discussion, pt had  opportunity to ask questions and has no further questions at this time. Pt continues to be resistant to treatment.    Greater than 50% was spent in counseling and coordination of care with the patient. Total time greater than: 45 minutes   BP weight and urine results all reviewed and noted. Last menstrual period 05/05/2015.  Fetal Surveillance Testing today:  none Fetal Heart rate:  134 bpm  Edema:  none Urinalysis: Positive for +1 protein   Questions were answered.  Lab and sonogram results have been reviewed. Comments: abnormal: +1 protein on UA; normal BP at 120/80  Assessment:  1.  Pregnancy at 2641w4d,  Estimated Date of Delivery: 02/09/16 :  G3P1011                         2.  US NT on 08/08/15 showed normal fetal growth and activity                         3.  Drug screen on 07/17/15 positive for Benzodiazepine   4. Pt continues to be resistant to inpatient or [facility] or [facility]   5. Pt considering termination; advised that D&C and [facility] are not options this late in pregnancy  Medication(s) Plans: Will no longer prescribe Xanax, any pain medications. Pt is to only receive medications related to pregnancy.    Treatment Plan:   Pt to be seen in the office every Monday for follow up.  Expressed to pt that she would not be receiving Xanax or pain medications from this office and should she go through withdrawal, she would have my full support in seeking treatment options.   Urine drug screen today  Pt advised to make decision regarding termination in 5 days, by next f/u appointment    Follow up in 5 days for appointment for high risk OB care    By signing my name below, I, Doreatha Martin, attest that this documentation has been prepared under the direction and in the presence of Tilda Burrow, MD. Electronically Signed: Doreatha Martin, ED Scribe. 08/22/2015. 2:22 PM.  I personally performed the services described in this documentation, which was  SCRIBED in my presence. The recorded information has been reviewed and considered accurate. It has been edited as necessary during review. Tilda Burrow, MD

## 2015-08-23 LAB — PMP SCREEN PROFILE (10S), URINE
Amphetamine Screen, Ur: NEGATIVE ng/mL
BARBITURATE SCRN UR: NEGATIVE ng/mL
Benzodiazepine Screen, Urine: POSITIVE ng/mL
COCAINE(METAB.) SCREEN, URINE: POSITIVE ng/mL
Cannabinoids Ur Ql Scn: POSITIVE ng/mL
Creatinine(Crt), U: 210.1 mg/dL (ref 20.0–300.0)
METHADONE SCREEN, URINE: NEGATIVE ng/mL
Opiate Scrn, Ur: POSITIVE ng/mL
Oxycodone+Oxymorphone Ur Ql Scn: POSITIVE ng/mL
PCP Scrn, Ur: NEGATIVE ng/mL
PH UR, DRUG SCRN: 6.8 (ref 4.5–8.9)
PROPOXYPHENE SCREEN: NEGATIVE ng/mL

## 2015-08-27 ENCOUNTER — Encounter: Payer: Self-pay | Admitting: Obstetrics and Gynecology

## 2015-09-01 ENCOUNTER — Inpatient Hospital Stay (HOSPITAL_COMMUNITY): Payer: Medicaid Other

## 2015-09-01 ENCOUNTER — Encounter (HOSPITAL_COMMUNITY): Admission: EM | Disposition: A | Discharge status: Home Health | Payer: Self-pay | Source: Home / Self Care

## 2015-09-01 ENCOUNTER — Emergency Department (HOSPITAL_COMMUNITY): Payer: Medicaid Other

## 2015-09-01 ENCOUNTER — Encounter (HOSPITAL_COMMUNITY): Payer: Self-pay | Admitting: *Deleted

## 2015-09-01 ENCOUNTER — Inpatient Hospital Stay (HOSPITAL_COMMUNITY): Payer: Medicaid Other | Admitting: Anesthesiology

## 2015-09-01 ENCOUNTER — Inpatient Hospital Stay (HOSPITAL_COMMUNITY)
Admission: EM | Admit: 2015-09-01 | Discharge: 2015-09-07 | DRG: 958 | Disposition: A | Discharge status: Home Health | Payer: Medicaid Other | Attending: General Surgery | Admitting: General Surgery

## 2015-09-01 DIAGNOSIS — S93121A Dislocation of metatarsophalangeal joint of right great toe, initial encounter: Secondary | ICD-10-CM | POA: Diagnosis present

## 2015-09-01 DIAGNOSIS — O99334 Smoking (tobacco) complicating childbirth: Secondary | ICD-10-CM | POA: Diagnosis present

## 2015-09-01 DIAGNOSIS — D62 Acute posthemorrhagic anemia: Secondary | ICD-10-CM | POA: Diagnosis not present

## 2015-09-01 DIAGNOSIS — J939 Pneumothorax, unspecified: Secondary | ICD-10-CM | POA: Diagnosis present

## 2015-09-01 DIAGNOSIS — O99342 Other mental disorders complicating pregnancy, second trimester: Secondary | ICD-10-CM | POA: Diagnosis present

## 2015-09-01 DIAGNOSIS — S93324A Dislocation of tarsometatarsal joint of right foot, initial encounter: Secondary | ICD-10-CM

## 2015-09-01 DIAGNOSIS — S272XXA Traumatic hemopneumothorax, initial encounter: Secondary | ICD-10-CM | POA: Diagnosis present

## 2015-09-01 DIAGNOSIS — S2242XA Multiple fractures of ribs, left side, initial encounter for closed fracture: Secondary | ICD-10-CM | POA: Diagnosis present

## 2015-09-01 DIAGNOSIS — S36115A Moderate laceration of liver, initial encounter: Secondary | ICD-10-CM | POA: Diagnosis present

## 2015-09-01 DIAGNOSIS — S92401A Displaced unspecified fracture of right great toe, initial encounter for closed fracture: Secondary | ICD-10-CM | POA: Diagnosis present

## 2015-09-01 DIAGNOSIS — S01511A Laceration without foreign body of lip, initial encounter: Secondary | ICD-10-CM | POA: Diagnosis present

## 2015-09-01 DIAGNOSIS — S92311A Displaced fracture of first metatarsal bone, right foot, initial encounter for closed fracture: Secondary | ICD-10-CM | POA: Diagnosis present

## 2015-09-01 DIAGNOSIS — T148XXA Other injury of unspecified body region, initial encounter: Secondary | ICD-10-CM

## 2015-09-01 DIAGNOSIS — F1910 Other psychoactive substance abuse, uncomplicated: Secondary | ICD-10-CM | POA: Diagnosis present

## 2015-09-01 DIAGNOSIS — F17210 Nicotine dependence, cigarettes, uncomplicated: Secondary | ICD-10-CM | POA: Diagnosis present

## 2015-09-01 DIAGNOSIS — F4310 Post-traumatic stress disorder, unspecified: Secondary | ICD-10-CM | POA: Diagnosis present

## 2015-09-01 DIAGNOSIS — G8929 Other chronic pain: Secondary | ICD-10-CM | POA: Diagnosis present

## 2015-09-01 DIAGNOSIS — B852 Pediculosis, unspecified: Secondary | ICD-10-CM | POA: Diagnosis present

## 2015-09-01 DIAGNOSIS — R52 Pain, unspecified: Secondary | ICD-10-CM

## 2015-09-01 DIAGNOSIS — S36113A Laceration of liver, unspecified degree, initial encounter: Secondary | ICD-10-CM | POA: Diagnosis present

## 2015-09-01 DIAGNOSIS — S92901A Unspecified fracture of right foot, initial encounter for closed fracture: Secondary | ICD-10-CM | POA: Diagnosis present

## 2015-09-01 DIAGNOSIS — S270XXA Traumatic pneumothorax, initial encounter: Secondary | ICD-10-CM | POA: Diagnosis present

## 2015-09-01 DIAGNOSIS — Z3A16 16 weeks gestation of pregnancy: Secondary | ICD-10-CM

## 2015-09-01 DIAGNOSIS — S92321A Displaced fracture of second metatarsal bone, right foot, initial encounter for closed fracture: Secondary | ICD-10-CM | POA: Diagnosis present

## 2015-09-01 DIAGNOSIS — F319 Bipolar disorder, unspecified: Secondary | ICD-10-CM | POA: Diagnosis present

## 2015-09-01 HISTORY — PX: ORIF ANKLE FRACTURE: SHX5408

## 2015-09-01 LAB — URINALYSIS, ROUTINE W REFLEX MICROSCOPIC
BILIRUBIN URINE: NEGATIVE
Glucose, UA: NEGATIVE mg/dL
Hgb urine dipstick: NEGATIVE
Ketones, ur: 40 mg/dL — AB
LEUKOCYTES UA: NEGATIVE
NITRITE: NEGATIVE
PH: 7 (ref 5.0–8.0)
Protein, ur: NEGATIVE mg/dL
SPECIFIC GRAVITY, URINE: 1.019 (ref 1.005–1.030)

## 2015-09-01 LAB — PROTIME-INR
INR: 1.05 (ref 0.00–1.49)
PROTHROMBIN TIME: 13.9 s (ref 11.6–15.2)

## 2015-09-01 LAB — RAPID URINE DRUG SCREEN, HOSP PERFORMED
Amphetamines: NOT DETECTED
BARBITURATES: NOT DETECTED
BENZODIAZEPINES: POSITIVE — AB
COCAINE: NOT DETECTED
OPIATES: POSITIVE — AB
TETRAHYDROCANNABINOL: POSITIVE — AB

## 2015-09-01 LAB — CBC
HCT: 30.6 % — ABNORMAL LOW (ref 36.0–46.0)
HEMATOCRIT: 26.5 % — AB (ref 36.0–46.0)
HEMOGLOBIN: 10.5 g/dL — AB (ref 12.0–15.0)
HEMOGLOBIN: 8.9 g/dL — AB (ref 12.0–15.0)
MCH: 29.4 pg (ref 26.0–34.0)
MCH: 29 pg (ref 26.0–34.0)
MCHC: 34.3 g/dL (ref 30.0–36.0)
MCHC: 33.6 g/dL (ref 30.0–36.0)
MCV: 85.7 fL (ref 78.0–100.0)
MCV: 86.3 fL (ref 78.0–100.0)
Platelets: 379 10*3/uL (ref 150–400)
Platelets: 258 10*3/uL (ref 150–400)
RBC: 3.57 MIL/uL — ABNORMAL LOW (ref 3.87–5.11)
RBC: 3.07 MIL/uL — AB (ref 3.87–5.11)
RDW: 13.7 % (ref 11.5–15.5)
RDW: 13.9 % (ref 11.5–15.5)
WBC: 15.3 10*3/uL — ABNORMAL HIGH (ref 4.0–10.5)
WBC: 16.6 10*3/uL — AB (ref 4.0–10.5)

## 2015-09-01 LAB — COMPREHENSIVE METABOLIC PANEL
ALBUMIN: 2.7 g/dL — AB (ref 3.5–5.0)
ALK PHOS: 145 U/L — AB (ref 38–126)
ALT: 112 U/L — AB (ref 14–54)
ANION GAP: 11 (ref 5–15)
AST: 226 U/L — AB (ref 15–41)
BUN: <5 mg/dL — ABNORMAL LOW (ref 6–20)
CALCIUM: 8.8 mg/dL — AB (ref 8.9–10.3)
CO2: 22 mmol/L (ref 22–32)
Chloride: 104 mmol/L (ref 101–111)
Creatinine, Ser: 0.6 mg/dL (ref 0.44–1.00)
GFR calc Af Amer: >60 mL/min (ref >60)
GFR calc non Af Amer: >60 mL/min (ref >60)
GLUCOSE: 132 mg/dL — AB (ref 65–99)
Potassium: 3.5 mmol/L (ref 3.5–5.1)
SODIUM: 137 mmol/L (ref 135–145)
Total Bilirubin: 0.6 mg/dL (ref 0.3–1.2)
Total Protein: 6.7 g/dL (ref 6.5–8.1)

## 2015-09-01 LAB — GLUCOSE, CAPILLARY: Glucose-Capillary: 98 mg/dL (ref 65–99)

## 2015-09-01 LAB — SAMPLE TO BLOOD BANK

## 2015-09-01 LAB — ABO/RH: ABO/RH(D): O POS

## 2015-09-01 LAB — CDS SEROLOGY

## 2015-09-01 LAB — ETHANOL: Alcohol, Ethyl (B): <5 mg/dL (ref <5)

## 2015-09-01 LAB — MRSA PCR SCREENING: MRSA by PCR: NEGATIVE

## 2015-09-01 SURGERY — OPEN REDUCTION INTERNAL FIXATION (ORIF) ANKLE FRACTURE
Anesthesia: General | Site: Foot | Laterality: Right

## 2015-09-01 MED ORDER — ONDANSETRON HCL 4 MG/2ML IJ SOLN: 4.0000 mg | Freq: Four times a day (QID) | INTRAMUSCULAR | Status: DC | PRN

## 2015-09-01 MED ORDER — KCL IN DEXTROSE-NACL 20-5-0.45 MEQ/L-%-% IV SOLN
INTRAVENOUS | Status: DC
  Administered 2015-09-01 – 2015-09-02 (×3): 100 mL/h via INTRAVENOUS
  Filled 2015-09-01 (×7): qty 1000

## 2015-09-01 MED ORDER — MIDAZOLAM HCL 2 MG/2ML IJ SOLN
2.0000 mg | Freq: Once | INTRAMUSCULAR | Status: AC
  Administered 2015-09-01: 2 mg via INTRAVENOUS

## 2015-09-01 MED ORDER — SUCCINYLCHOLINE CHLORIDE 20 MG/ML IJ SOLN
INTRAMUSCULAR | Status: DC | PRN
  Administered 2015-09-01: 100 mg via INTRAVENOUS

## 2015-09-01 MED ORDER — PERMETHRIN 1 % EX LOTN
TOPICAL_LOTION | Freq: Once | CUTANEOUS | Status: DC
  Filled 2015-09-01: qty 59

## 2015-09-01 MED ORDER — SODIUM CHLORIDE 0.9 % IV SOLN
Freq: Once | INTRAVENOUS | Status: AC
  Administered 2015-09-01: 13:00:00 via INTRAVENOUS

## 2015-09-01 MED ORDER — SERTRALINE HCL 100 MG PO TABS
100.0000 mg | ORAL_TABLET | Freq: Every day | ORAL | Status: DC
  Filled 2015-09-01: qty 1

## 2015-09-01 MED ORDER — ONDANSETRON HCL 4 MG PO TABS: 4.0000 mg | ORAL_TABLET | Freq: Four times a day (QID) | ORAL | Status: DC | PRN

## 2015-09-01 MED ORDER — LIDOCAINE HCL (PF) 1 % IJ SOLN
30.0000 mL | Freq: Once | INTRAMUSCULAR | Status: AC
  Administered 2015-09-01: 30 mL
  Filled 2015-09-01: qty 30

## 2015-09-01 MED ORDER — IOHEXOL 300 MG/ML  SOLN
75.0000 mL | Freq: Once | INTRAMUSCULAR | Status: AC | PRN
  Administered 2015-09-01: 75 mL via INTRAVENOUS

## 2015-09-01 MED ORDER — PROMETHAZINE HCL 25 MG/ML IJ SOLN: 6.2500 mg | INTRAMUSCULAR | Status: DC | PRN

## 2015-09-01 MED ORDER — GLYCOPYRROLATE 0.2 MG/ML IJ SOLN
INTRAMUSCULAR | Status: DC | PRN
  Administered 2015-09-01: .4 mg via INTRAVENOUS

## 2015-09-01 MED ORDER — HYDROMORPHONE HCL 1 MG/ML IJ SOLN
INTRAMUSCULAR | Status: AC
  Administered 2015-09-01: 0.5 mg via INTRAVENOUS
  Filled 2015-09-01: qty 2

## 2015-09-01 MED ORDER — FENTANYL CITRATE (PF) 100 MCG/2ML IJ SOLN
100.0000 ug | Freq: Once | INTRAMUSCULAR | Status: AC
  Administered 2015-09-01: 100 ug via INTRAVENOUS
  Filled 2015-09-01: qty 2

## 2015-09-01 MED ORDER — ALPRAZOLAM 0.5 MG PO TABS
1.0000 mg | ORAL_TABLET | Freq: Four times a day (QID) | ORAL | Status: DC
  Administered 2015-09-01 – 2015-09-07 (×22): 1 mg via ORAL
  Filled 2015-09-01 (×22): qty 2

## 2015-09-01 MED ORDER — FENTANYL CITRATE (PF) 250 MCG/5ML IJ SOLN
INTRAMUSCULAR | Status: AC
  Filled 2015-09-01: qty 5

## 2015-09-01 MED ORDER — HYDROMORPHONE HCL 1 MG/ML PO LIQD: 1.0000 mg | ORAL | Status: DC | PRN

## 2015-09-01 MED ORDER — PANTOPRAZOLE SODIUM 40 MG PO TBEC
40.0000 mg | DELAYED_RELEASE_TABLET | Freq: Every day | ORAL | Status: DC
  Administered 2015-09-02 – 2015-09-03 (×2): 40 mg via ORAL
  Filled 2015-09-01 (×2): qty 1

## 2015-09-01 MED ORDER — FENTANYL CITRATE (PF) 100 MCG/2ML IJ SOLN
25.0000 ug | INTRAMUSCULAR | Status: DC | PRN
  Administered 2015-09-01 (×2): 50 ug via INTRAVENOUS
  Administered 2015-09-02 (×3): 100 ug via INTRAVENOUS
  Administered 2015-09-02: 50 ug via INTRAVENOUS
  Administered 2015-09-02 (×2): 100 ug via INTRAVENOUS
  Administered 2015-09-02 (×3): 50 ug via INTRAVENOUS
  Administered 2015-09-02 (×2): 100 ug via INTRAVENOUS
  Administered 2015-09-02: 50 ug via INTRAVENOUS
  Administered 2015-09-02: 100 ug via INTRAVENOUS
  Administered 2015-09-02: 50 ug via INTRAVENOUS
  Administered 2015-09-02 – 2015-09-03 (×10): 100 ug via INTRAVENOUS
  Filled 2015-09-01 (×24): qty 2

## 2015-09-01 MED ORDER — 0.9 % SODIUM CHLORIDE (POUR BTL) OPTIME
TOPICAL | Status: DC | PRN
  Administered 2015-09-01: 1000 mL

## 2015-09-01 MED ORDER — SODIUM CHLORIDE 0.9 % IV BOLUS (SEPSIS)
1000.0000 mL | Freq: Once | INTRAVENOUS | Status: AC
  Administered 2015-09-01: 1000 mL via INTRAVENOUS

## 2015-09-01 MED ORDER — FENTANYL CITRATE (PF) 100 MCG/2ML IJ SOLN
50.0000 ug | Freq: Once | INTRAMUSCULAR | Status: AC
  Administered 2015-09-01: 50 ug via INTRAVENOUS
  Filled 2015-09-01: qty 2

## 2015-09-01 MED ORDER — MIDAZOLAM HCL 2 MG/2ML IJ SOLN
INTRAMUSCULAR | Status: AC
  Filled 2015-09-01: qty 2

## 2015-09-01 MED ORDER — PHENYLEPHRINE HCL 10 MG/ML IJ SOLN
INTRAMUSCULAR | Status: DC | PRN
  Administered 2015-09-01 (×2): 80 ug via INTRAVENOUS

## 2015-09-01 MED ORDER — FENTANYL CITRATE (PF) 100 MCG/2ML IJ SOLN
100.0000 ug | Freq: Once | INTRAMUSCULAR | Status: AC
Start: 2015-09-01 — End: 2015-09-01
  Administered 2015-09-01: 100 ug via INTRAVENOUS
  Filled 2015-09-01: qty 2

## 2015-09-01 MED ORDER — PRENATAL 27-0.8 MG PO TABS
1.0000 | ORAL_TABLET | Freq: Every day | ORAL | Status: DC
  Administered 2015-09-02 – 2015-09-07 (×6): 1 via ORAL
  Filled 2015-09-01 (×7): qty 1

## 2015-09-01 MED ORDER — PERMETHRIN 5 % EX CREA
TOPICAL_CREAM | Freq: Once | CUTANEOUS | Status: AC
  Administered 2015-09-01: 1 via TOPICAL
  Filled 2015-09-01 (×2): qty 60

## 2015-09-01 MED ORDER — ONDANSETRON HCL 4 MG/2ML IJ SOLN
INTRAMUSCULAR | Status: DC | PRN
  Administered 2015-09-01: 4 mg via INTRAVENOUS

## 2015-09-01 MED ORDER — ROCURONIUM BROMIDE 100 MG/10ML IV SOLN
INTRAVENOUS | Status: DC | PRN
  Administered 2015-09-01: 20 mg via INTRAVENOUS
  Administered 2015-09-01: 30 mg via INTRAVENOUS

## 2015-09-01 MED ORDER — CEFAZOLIN SODIUM 1-5 GM-% IV SOLN
1.0000 g | Freq: Three times a day (TID) | INTRAVENOUS | Status: DC
  Administered 2015-09-01: 1 g via INTRAVENOUS
  Administered 2015-09-01: 2 g via INTRAVENOUS
  Administered 2015-09-02 – 2015-09-04 (×7): 1 g via INTRAVENOUS
  Filled 2015-09-01 (×12): qty 50

## 2015-09-01 MED ORDER — NEOSTIGMINE METHYLSULFATE 10 MG/10ML IV SOLN
INTRAVENOUS | Status: DC | PRN
  Administered 2015-09-01: 3 mg via INTRAVENOUS

## 2015-09-01 MED ORDER — PROMETHAZINE HCL 25 MG PO TABS: 25.0000 mg | ORAL_TABLET | Freq: Four times a day (QID) | ORAL | Status: DC | PRN

## 2015-09-01 MED ORDER — FENTANYL CITRATE (PF) 100 MCG/2ML IJ SOLN: 50.0000 ug | Freq: Once | INTRAMUSCULAR | Status: DC

## 2015-09-01 MED ORDER — LACTATED RINGERS IV SOLN
INTRAVENOUS | Status: DC | PRN
  Administered 2015-09-01 (×2): via INTRAVENOUS

## 2015-09-01 MED ORDER — OXYCODONE HCL 5 MG PO TABS
5.0000 mg | ORAL_TABLET | ORAL | Status: DC | PRN
  Administered 2015-09-01 – 2015-09-03 (×7): 5 mg via ORAL
  Filled 2015-09-01 (×7): qty 1

## 2015-09-01 MED ORDER — HYDROMORPHONE HCL 2 MG PO TABS
1.0000 mg | ORAL_TABLET | ORAL | Status: DC | PRN
  Administered 2015-09-01 – 2015-09-04 (×15): 2 mg via ORAL
  Filled 2015-09-01 (×16): qty 1

## 2015-09-01 MED ORDER — CEFAZOLIN SODIUM-DEXTROSE 2-3 GM-% IV SOLR: 2.0000 g | Freq: Once | INTRAVENOUS | Status: DC

## 2015-09-01 MED ORDER — HYDROMORPHONE HCL 1 MG/ML IJ SOLN
0.2500 mg | INTRAMUSCULAR | Status: DC | PRN
  Administered 2015-09-01 (×4): 0.5 mg via INTRAVENOUS

## 2015-09-01 MED ORDER — PANTOPRAZOLE SODIUM 40 MG IV SOLR
40.0000 mg | Freq: Every day | INTRAVENOUS | Status: DC
  Filled 2015-09-01 (×3): qty 40

## 2015-09-01 MED ORDER — FENTANYL CITRATE (PF) 100 MCG/2ML IJ SOLN
INTRAMUSCULAR | Status: DC | PRN
  Administered 2015-09-01 (×10): 50 ug via INTRAVENOUS

## 2015-09-01 MED ORDER — LIDOCAINE HCL (CARDIAC) 20 MG/ML IV SOLN
INTRAVENOUS | Status: DC | PRN
  Administered 2015-09-01: 80 mg via INTRAVENOUS

## 2015-09-01 SURGICAL SUPPLY — 72 items
BANDAGE ELASTIC 4 VELCRO ST LF (GAUZE/BANDAGES/DRESSINGS) ×2 IMPLANT
BANDAGE ELASTIC 6 VELCRO ST LF (GAUZE/BANDAGES/DRESSINGS) ×2 IMPLANT
BANDAGE ESMARK 6X9 LF (GAUZE/BANDAGES/DRESSINGS) ×1 IMPLANT
BIT DRILL QC 1.8X125 (Sternal Fixation) ×2 IMPLANT
BLADE SURG 10 STRL SS (BLADE) ×3 IMPLANT
BNDG CMPR 9X6 STRL LF SNTH (GAUZE/BANDAGES/DRESSINGS) ×1
BNDG COHESIVE 4X5 TAN STRL (GAUZE/BANDAGES/DRESSINGS) ×1 IMPLANT
BNDG ESMARK 6X9 LF (GAUZE/BANDAGES/DRESSINGS) ×3
BNDG GAUZE ELAST 4 BULKY (GAUZE/BANDAGES/DRESSINGS) ×6 IMPLANT
BRUSH SCRUB DISP (MISCELLANEOUS) ×8 IMPLANT
COVER SURGICAL LIGHT HANDLE (MISCELLANEOUS) ×6 IMPLANT
DRAPE C-ARM 42X72 X-RAY (DRAPES) ×2 IMPLANT
DRAPE C-ARMOR (DRAPES) ×2 IMPLANT
DRAPE ORTHO SPLIT 77X108 STRL (DRAPES) ×9
DRAPE PROXIMA HALF (DRAPES) ×3 IMPLANT
DRAPE SURG ORHT 6 SPLT 77X108 (DRAPES) ×3 IMPLANT
DRAPE U-SHAPE 47X51 STRL (DRAPES) ×3 IMPLANT
DRSG EMULSION OIL 3X3 NADH (GAUZE/BANDAGES/DRESSINGS) IMPLANT
DRSG MEPITEL 4X7.2 (GAUZE/BANDAGES/DRESSINGS) ×2 IMPLANT
ELECT REM PT RETURN 9FT ADLT (ELECTROSURGICAL) ×3
ELECTRODE REM PT RTRN 9FT ADLT (ELECTROSURGICAL) ×1 IMPLANT
GAUZE SPONGE 4X4 12PLY STRL (GAUZE/BANDAGES/DRESSINGS) IMPLANT
GLOVE BIO SURGEON STRL SZ7.5 (GLOVE) ×1 IMPLANT
GLOVE BIO SURGEON STRL SZ8 (GLOVE) ×1 IMPLANT
GLOVE BIOGEL PI IND STRL 6.5 (GLOVE) IMPLANT
GLOVE BIOGEL PI IND STRL 7.5 (GLOVE) ×1 IMPLANT
GLOVE BIOGEL PI IND STRL 8 (GLOVE) ×1 IMPLANT
GLOVE BIOGEL PI INDICATOR 6.5 (GLOVE) ×2
GLOVE BIOGEL PI INDICATOR 7.5 (GLOVE) ×2
GLOVE BIOGEL PI INDICATOR 8 (GLOVE) ×2
GLOVE SURG SS PI 6.5 STRL IVOR (GLOVE) ×2 IMPLANT
GLOVE SURG SS PI 7.5 STRL IVOR (GLOVE) ×2 IMPLANT
GLOVE SURG SS PI 8.0 STRL IVOR (GLOVE) ×4 IMPLANT
GOWN STRL REUS W/ TWL LRG LVL3 (GOWN DISPOSABLE) ×2 IMPLANT
GOWN STRL REUS W/ TWL XL LVL3 (GOWN DISPOSABLE) ×1 IMPLANT
GOWN STRL REUS W/TWL LRG LVL3 (GOWN DISPOSABLE) ×6
GOWN STRL REUS W/TWL XL LVL3 (GOWN DISPOSABLE) ×3
KIT BASIN OR (CUSTOM PROCEDURE TRAY) ×3 IMPLANT
KIT ROOM TURNOVER OR (KITS) ×3 IMPLANT
MANIFOLD NEPTUNE II (INSTRUMENTS) ×1 IMPLANT
NDL HYPO 21X1.5 SAFETY (NEEDLE) IMPLANT
NEEDLE HYPO 21X1.5 SAFETY (NEEDLE) IMPLANT
NS IRRIG 1000ML POUR BTL (IV SOLUTION) ×3 IMPLANT
PACK GENERAL/GYN (CUSTOM PROCEDURE TRAY) ×5 IMPLANT
PAD ARMBOARD 7.5X6 YLW CONV (MISCELLANEOUS) ×6 IMPLANT
PAD CAST 4YDX4 CTTN HI CHSV (CAST SUPPLIES) IMPLANT
PADDING CAST COTTON 4X4 STRL (CAST SUPPLIES) ×3
PADDING CAST COTTON 6X4 STRL (CAST SUPPLIES) ×2 IMPLANT
PENCIL BUTTON HOLSTER BLD 10FT (ELECTRODE) ×3 IMPLANT
PLATE LC CDP 2.4 8H (Plate) ×2 IMPLANT
SCREW CORTEX 2.4 16MM (Screw) ×4 IMPLANT
SCREW CORTEX 2.4 18MM (Screw) ×4 IMPLANT
SCREW CORTEX 2.4 8MM (Screw) ×2 IMPLANT
SPONGE GAUZE 4X4 12PLY STER LF (GAUZE/BANDAGES/DRESSINGS) ×2 IMPLANT
SPONGE LAP 18X18 X RAY DECT (DISPOSABLE) ×5 IMPLANT
SPONGE SCRUB IODOPHOR (GAUZE/BANDAGES/DRESSINGS) ×3 IMPLANT
STAPLER VISISTAT 35W (STAPLE) IMPLANT
SUCTION FRAZIER TIP 10 FR DISP (SUCTIONS) ×3 IMPLANT
SUT ETHILON 2 0 FS 18 (SUTURE) ×5 IMPLANT
SUT ETHILON 3 0 PS 1 (SUTURE) ×4 IMPLANT
SUT PDS 0 CT 1 18  CR/8 (SUTURE) ×2 IMPLANT
SUT PDS AB 2-0 CT1 27 (SUTURE) ×2 IMPLANT
SUT VIC AB 2-0 CT1 27 (SUTURE) ×3
SUT VIC AB 2-0 CT1 TAPERPNT 27 (SUTURE) ×2 IMPLANT
SUT VIC AB 2-0 CT3 27 (SUTURE) IMPLANT
SYR CONTROL 10ML LL (SYRINGE) IMPLANT
TOWEL OR 17X24 6PK STRL BLUE (TOWEL DISPOSABLE) ×5 IMPLANT
TOWEL OR 17X26 10 PK STRL BLUE (TOWEL DISPOSABLE) ×6 IMPLANT
TUBE CONNECTING 12'X1/4 (SUCTIONS) ×1
TUBE CONNECTING 12X1/4 (SUCTIONS) ×2 IMPLANT
UNDERPAD 30X30 INCONTINENT (UNDERPADS AND DIAPERS) ×5 IMPLANT
WATER STERILE IRR 1000ML POUR (IV SOLUTION) ×3 IMPLANT
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
## 2015-09-01 NOTE — Progress Notes (Signed)
Patient requesting additional pain medication. Notified Fudan, CRNA stated she would be out shortly ([facility] has been medicating patient while in short stay). Patient made aware.

## 2015-09-01 NOTE — ED Notes (Signed)
While administering pain medication to pt, pt reports, "Can you put the saline in right after." Informed pt that saline was hanging and infusing while i'm giving the pain medication. Pt reports, "But if you push the saline after it will hit me faster." Informed pt the danger of rapid infusion of pain medication.

## 2015-09-01 NOTE — Anesthesia Postprocedure Evaluation (Signed)
Anesthesia Post Note  Patient: Abigail [name]  Procedure(s) Performed: Procedure(s) (LRB): OPEN REDUCTION INTERNAL FIXATION (ORIF)  OPEN FOOT FRACTURES DISLOCATION (Right)  Patient location during evaluation: PACU Anesthesia Type: General Level of consciousness: awake and alert Pain management: pain level controlled Vital Signs Assessment: post-procedure vital signs reviewed and stable Respiratory status: spontaneous breathing, nonlabored ventilation, respiratory function stable and patient connected to nasal cannula oxygen Cardiovascular status: blood pressure returned to baseline and stable Postop Assessment: no signs of nausea or vomiting Anesthetic complications: no    Last Vitals:  Filed Vitals:   09/01/15 1900 09/01/15 1915  BP:  121/72  Pulse: 74 83  Temp:  36.8 C  Resp: 20 19    Last Pain:  Filed Vitals:   09/01/15 1933  PainSc: 10-Worst pain ever                 Wilbert Schouten,JAMES TERRILL

## 2015-09-01 NOTE — ED Notes (Signed)
Port chest xray in process 
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
## 2015-09-01 NOTE — Anesthesia Preprocedure Evaluation (Addendum)
Anesthesia Evaluation  Patient identified by MRN, date of birth, ID band Patient awake    Reviewed: Allergy & Precautions, NPO status , Patient's Chart, lab work & pertinent test results  History of Anesthesia Complications Negative for: history of anesthetic complications  Airway Mallampati: II  TM Distance: >3 FB Neck ROM: Full    Dental  (+) Teeth Intact   Pulmonary Current Smoker,  Pneumothorax and chesttube  Left side pneumothorax, Chest tube in place.   breath sounds clear to auscultation       Cardiovascular  Rhythm:Regular Rate:Normal     Neuro/Psych Anxiety Depression Bipolar Disorder    GI/Hepatic (+)     substance abuse  , Known hx of multiple substance use and recently   Endo/Other  negative endocrine ROS  Renal/GU    Pregnancy at 16-17 weeks    Musculoskeletal Broken ribs   Abdominal   Peds  Hematology  (+) anemia ,   Anesthesia Other Findings   Reproductive/Obstetrics                          Anesthesia Physical Anesthesia Plan  ASA: II and emergent  Anesthesia Plan: General   Post-op Pain Management:    Induction: Intravenous  Airway Management Planned: Oral ETT  Additional Equipment:   Intra-op Plan:   Post-operative Plan: Extubation in OR  Informed Consent: I have reviewed the patients History and Physical, chart, labs and discussed the procedure including the risks, benefits and alternatives for the proposed anesthesia with the patient or authorized representative who has indicated his/her understanding and acceptance.   Dental advisory given  Plan Discussed with: Anesthesiologist and Surgeon  Anesthesia Plan Comments:        Anesthesia Quick Evaluation
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
## 2015-09-01 NOTE — Anesthesia Procedure Notes (Signed)
Procedure Name: Intubation Date/Time: 09/01/2015 3:52 PM Performed by: Dairl Ponder Pre-anesthesia Checklist: Patient identified, Emergency Drugs available, Timeout performed, Suction available and Patient being monitored Patient Re-evaluated:Patient Re-evaluated prior to inductionOxygen Delivery Method: Circle system utilized Preoxygenation: Pre-oxygenation with 100% oxygen Intubation Type: IV induction, Cricoid Pressure applied and Rapid sequence Ventilation: Mask ventilation without difficulty Laryngoscope Size: Mac and 3 Grade View: Grade I Tube type: Oral Tube size: 7.0 mm Number of attempts: 1 Airway Equipment and Method: Stylet Placement Confirmation: ETT inserted through vocal cords under direct vision,  breath sounds checked- equal and bilateral and positive ETCO2 Secured at: 22 cm Tube secured with: Tape Dental Injury: Teeth and Oropharynx as per pre-operative assessment

## 2015-09-01 NOTE — Transfer of Care (Signed)
Immediate Anesthesia Transfer of Care Note  Patient: Abigail [name]  Procedure(s) Performed: Procedure(s): OPEN REDUCTION INTERNAL FIXATION (ORIF)  OPEN FOOT FRACTURES DISLOCATION (Right)  Patient Location: PACU  Anesthesia Type:General  Level of Consciousness: awake, alert  and oriented  Airway & Oxygen Therapy: Patient Spontanous Breathing and Patient connected to nasal cannula oxygen  Post-op Assessment: Report given to RN and Post -op Vital signs reviewed and stable  Post vital signs: Reviewed and stable  Last Vitals:  Filed Vitals:   09/01/15 1300 09/01/15 1400  BP: 126/73 123/65  Pulse: 78 73  Temp: 36.9 C   Resp: 35 25    Complications: No apparent anesthesia complications

## 2015-09-01 NOTE — Progress Notes (Signed)
Tongue ring and nose ring removed, placed in bag, and placed on chart.

## 2015-09-01 NOTE — ED Notes (Signed)
Dr Janee Morn at bedside for chest tube insertion

## 2015-09-01 NOTE — Procedures (Signed)
Chest Tube Insertion Procedure Note  Pre-operative Diagnosis: Pneumothorax  Post-operative Diagnosis: Pneumothorax  Procedure Details  Informed consent was obtained for the procedure, including sedation.  Risks of lung perforation, hemorrhage, arrhythmia, and adverse drug reaction were discussed.   After sterile skin prep, using standard technique, a 20 French tube was placed in the left anterior axillary line, above nipple level  Findings:rush of air  Estimated Blood Loss:  10cc              Complications:  None; patient tolerated the procedure well.         Disposition: trauma bay         Condition: stable  Violeta Gelinas, MD, MPH, FACS Trauma: [phone] General Surgery: [phone]
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
## 2015-09-01 NOTE — ED Notes (Signed)
portable xray done

## 2015-09-01 NOTE — ED Notes (Signed)
Dr. Thompson at bedside. 
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
## 2015-09-01 NOTE — ED Notes (Signed)
Abigail [name] Consulting civil engineer found an insulin syringe underneath pt. Pt reports last drug use was yesterday, but reports that is was either dilaudid or heroin.

## 2015-09-01 NOTE — Progress Notes (Signed)
Received pt from PACU, alert and oriented x 4, complaining of pain.  Dr Janee Morn contacted for prn pain meds.
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
## 2015-09-02 ENCOUNTER — Inpatient Hospital Stay (HOSPITAL_COMMUNITY): Payer: Medicaid Other

## 2015-09-02 LAB — HEPATITIS PANEL, ACUTE
HCV Ab: >11 s/co ratio — ABNORMAL HIGH (ref 0.0–0.9)
Hep A IgM: NEGATIVE
Hep B C IgM: NEGATIVE
Hepatitis B Surface Ag: NEGATIVE

## 2015-09-02 LAB — CBC
HCT: 24.6 % — ABNORMAL LOW (ref 36.0–46.0)
HEMATOCRIT: 27 % — AB (ref 36.0–46.0)
HEMATOCRIT: 23.7 % — AB (ref 36.0–46.0)
HEMOGLOBIN: 9 g/dL — AB (ref 12.0–15.0)
HEMOGLOBIN: 8 g/dL — AB (ref 12.0–15.0)
Hemoglobin: 8.3 g/dL — ABNORMAL LOW (ref 12.0–15.0)
MCH: 29 pg (ref 26.0–34.0)
MCH: 29.3 pg (ref 26.0–34.0)
MCH: 29.2 pg (ref 26.0–34.0)
MCHC: 33.3 g/dL (ref 30.0–36.0)
MCHC: 33.8 g/dL (ref 30.0–36.0)
MCHC: 33.7 g/dL (ref 30.0–36.0)
MCV: 87.1 fL (ref 78.0–100.0)
MCV: 86.8 fL (ref 78.0–100.0)
MCV: 86.6 fL (ref 78.0–100.0)
PLATELETS: 243 10*3/uL (ref 150–400)
Platelets: 276 10*3/uL (ref 150–400)
Platelets: 254 10*3/uL (ref 150–400)
RBC: 3.1 MIL/uL — AB (ref 3.87–5.11)
RBC: 2.73 MIL/uL — ABNORMAL LOW (ref 3.87–5.11)
RBC: 2.84 MIL/uL — ABNORMAL LOW (ref 3.87–5.11)
RDW: 14 % (ref 11.5–15.5)
RDW: 14.1 % (ref 11.5–15.5)
RDW: 14.1 % (ref 11.5–15.5)
WBC: 14.2 10*3/uL — ABNORMAL HIGH (ref 4.0–10.5)
WBC: 12.9 10*3/uL — ABNORMAL HIGH (ref 4.0–10.5)
WBC: 12.9 10*3/uL — ABNORMAL HIGH (ref 4.0–10.5)

## 2015-09-02 LAB — GLUCOSE, CAPILLARY: GLUCOSE-CAPILLARY: 103 mg/dL — AB (ref 65–99)

## 2015-09-02 MED ORDER — ACETAMINOPHEN 325 MG PO TABS
650.0000 mg | ORAL_TABLET | Freq: Four times a day (QID) | ORAL | Status: DC | PRN
  Administered 2015-09-05 – 2015-09-07 (×3): 650 mg via ORAL
  Filled 2015-09-02 (×3): qty 2
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
## 2015-09-03 ENCOUNTER — Inpatient Hospital Stay (HOSPITAL_COMMUNITY): Payer: Medicaid Other

## 2015-09-03 ENCOUNTER — Encounter (HOSPITAL_COMMUNITY): Payer: Self-pay | Admitting: Orthopedic Surgery

## 2015-09-03 MED ORDER — TRAMADOL HCL 50 MG PO TABS
50.0000 mg | ORAL_TABLET | Freq: Four times a day (QID) | ORAL | Status: DC
  Administered 2015-09-03 – 2015-09-04 (×5): 50 mg via ORAL
  Filled 2015-09-03 (×5): qty 1

## 2015-09-03 MED ORDER — WHITE PETROLATUM GEL
Status: AC
Start: 2015-09-03 — End: 2015-09-03
  Administered 2015-09-03: 0.2
  Filled 2015-09-03: qty 1

## 2015-09-03 MED ORDER — FENTANYL CITRATE (PF) 100 MCG/2ML IJ SOLN
25.0000 ug | INTRAMUSCULAR | Status: DC | PRN
  Administered 2015-09-03 – 2015-09-04 (×8): 50 ug via INTRAVENOUS
  Filled 2015-09-03 (×8): qty 2

## 2015-09-03 MED ORDER — OXYCODONE HCL 5 MG PO TABS
10.0000 mg | ORAL_TABLET | ORAL | Status: DC | PRN
  Administered 2015-09-03 – 2015-09-07 (×19): 20 mg via ORAL
  Filled 2015-09-03 (×21): qty 4
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
## 2015-09-03 NOTE — Progress Notes (Signed)
Fetal heart beat assessed. Heard strong and regular
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
## 2015-09-04 ENCOUNTER — Inpatient Hospital Stay (HOSPITAL_COMMUNITY): Payer: Medicaid Other

## 2015-09-04 LAB — CBC
HEMATOCRIT: 25.8 % — AB (ref 36.0–46.0)
Hemoglobin: 8.6 g/dL — ABNORMAL LOW (ref 12.0–15.0)
MCH: 29 pg (ref 26.0–34.0)
MCHC: 33.3 g/dL (ref 30.0–36.0)
MCV: 86.9 fL (ref 78.0–100.0)
PLATELETS: 268 10*3/uL (ref 150–400)
RBC: 2.97 MIL/uL — AB (ref 3.87–5.11)
RDW: 13.9 % (ref 11.5–15.5)
WBC: 10.8 10*3/uL — AB (ref 4.0–10.5)

## 2015-09-04 LAB — BASIC METABOLIC PANEL
Anion gap: 7 (ref 5–15)
BUN: <5 mg/dL — ABNORMAL LOW (ref 6–20)
CO2: 24 mmol/L (ref 22–32)
CREATININE: 0.49 mg/dL (ref 0.44–1.00)
Calcium: 8.5 mg/dL — ABNORMAL LOW (ref 8.9–10.3)
Chloride: 105 mmol/L (ref 101–111)
GFR calc non Af Amer: >60 mL/min (ref >60)
Glucose, Bld: 96 mg/dL (ref 65–99)
Potassium: 4 mmol/L (ref 3.5–5.1)
SODIUM: 136 mmol/L (ref 135–145)

## 2015-09-04 MED ORDER — FENTANYL 50 MCG/HR TD PT72
100.0000 ug | MEDICATED_PATCH | TRANSDERMAL | Status: DC
  Administered 2015-09-04: 100 ug via TRANSDERMAL
  Filled 2015-09-04: qty 2

## 2015-09-04 MED ORDER — HYDROMORPHONE HCL 1 MG/ML IJ SOLN: 1.0000 mg | INTRAMUSCULAR | Status: DC | PRN

## 2015-09-04 MED ORDER — TRAMADOL HCL 50 MG PO TABS
100.0000 mg | ORAL_TABLET | Freq: Four times a day (QID) | ORAL | Status: DC
  Administered 2015-09-04 – 2015-09-07 (×12): 100 mg via ORAL
  Filled 2015-09-04 (×12): qty 2

## 2015-09-04 MED ORDER — HYDROMORPHONE HCL 1 MG/ML IJ SOLN
INTRAMUSCULAR | Status: AC
  Filled 2015-09-04: qty 2

## 2015-09-04 MED ORDER — HYDROMORPHONE HCL 1 MG/ML IJ SOLN
2.0000 mg | INTRAMUSCULAR | Status: DC | PRN
  Administered 2015-09-04 – 2015-09-05 (×7): 2 mg via INTRAVENOUS
  Filled 2015-09-04 (×6): qty 2

## 2015-09-04 MED ORDER — DOCUSATE SODIUM 100 MG PO CAPS
100.0000 mg | ORAL_CAPSULE | Freq: Two times a day (BID) | ORAL | Status: DC
  Administered 2015-09-04 – 2015-09-07 (×7): 100 mg via ORAL
  Filled 2015-09-04 (×7): qty 1

## 2015-09-04 MED ORDER — POLYETHYLENE GLYCOL 3350 17 G PO PACK
17.0000 g | PACK | Freq: Every day | ORAL | Status: DC
  Administered 2015-09-04 – 2015-09-07 (×4): 17 g via ORAL
  Filled 2015-09-04 (×4): qty 1

## 2015-09-04 MED ORDER — WHITE PETROLATUM GEL
Status: AC
  Administered 2015-09-04: 0.2
  Filled 2015-09-04: qty 1
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
## 2015-09-04 NOTE — Clinical Social Work Note (Signed)
CSW went to patient's room to complete assessment. Patient is lying in bed with lights and curtains down. The patient states, "Can you come back some other time, I'm trying to rest." CSW will attempt to see at a later time to assess and provide substance abuse resources.    Roddie Mc MSW, [place], [place], [phone]

## 2015-09-05 ENCOUNTER — Inpatient Hospital Stay (HOSPITAL_COMMUNITY): Payer: Medicaid Other

## 2015-09-05 MED ORDER — HYDROMORPHONE HCL 1 MG/ML IJ SOLN
0.5000 mg | INTRAMUSCULAR | Status: DC | PRN
  Administered 2015-09-05 – 2015-09-07 (×8): 0.5 mg via INTRAVENOUS
  Filled 2015-09-05 (×8): qty 1

## 2015-09-05 MED ORDER — FENTANYL 75 MCG/HR TD PT72
175.0000 ug | MEDICATED_PATCH | TRANSDERMAL | Status: DC
  Administered 2015-09-05: 175 ug via TRANSDERMAL
  Filled 2015-09-05: qty 1
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
## 2015-09-06 LAB — CBC
HCT: 26 % — ABNORMAL LOW (ref 36.0–46.0)
Hemoglobin: 8.4 g/dL — ABNORMAL LOW (ref 12.0–15.0)
MCH: 28.6 pg (ref 26.0–34.0)
MCHC: 32.3 g/dL (ref 30.0–36.0)
MCV: 88.4 fL (ref 78.0–100.0)
PLATELETS: 261 10*3/uL (ref 150–400)
RBC: 2.94 MIL/uL — ABNORMAL LOW (ref 3.87–5.11)
RDW: 14.2 % (ref 11.5–15.5)
WBC: 9.7 10*3/uL (ref 4.0–10.5)

## 2015-09-06 MED ORDER — FENTANYL 25 MCG/HR TD PT72
25.0000 ug | MEDICATED_PATCH | TRANSDERMAL | Status: DC
  Administered 2015-09-06: 25 ug via TRANSDERMAL
  Filled 2015-09-06: qty 1

## 2015-09-06 MED ORDER — FENTANYL 25 MCG/HR TD PT72: 25.0000 ug | MEDICATED_PATCH | TRANSDERMAL | Status: DC

## 2015-09-06 MED ORDER — FENTANYL 75 MCG/HR TD PT72: 175.0000 ug | MEDICATED_PATCH | TRANSDERMAL | Status: DC

## 2015-09-06 MED ORDER — FENTANYL 75 MCG/HR TD PT72: 200.0000 ug | MEDICATED_PATCH | TRANSDERMAL | Status: DC

## 2015-09-06 NOTE — Progress Notes (Signed)
Met with pt and mother to discuss dc planning; PTA, pt resides at home with mother.  She plans to return there at discharge.  CIR PA states pt does not meet justification for IP rehab stay.  Pt does not have qualifying diagnosis for home therapies with no insurance.  We will, however, be able to obtain DME for pt upon discharge.  PT recommending RW and 3 in 1 BSC.  Pt/mom requesting WC for home, as they state their house is approximately 40 feet lengthwise.  Pt states she tires easily with using the walker all of the time.  Will follow upon dc for DME needs.    Reinaldo Raddle, RN, BSN  Trauma/Neuro ICU Case Manager [phone]
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
## 2015-09-06 NOTE — Progress Notes (Signed)
OT Cancellation Note  Patient Details Name: LAVERTA HARNISCH MRN: [id number] DOB: [birth year]   Cancelled Treatment:    Reason Eval/Treat Not Completed: Other (comment) (Pt just began eating lunch. ) OT to reattempt later today or tomorrow.   Pilar Grammes 09/06/2015, 2:04 PM
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
## 2015-09-06 NOTE — Progress Notes (Signed)
Thank you for consult on Abigail [name]. Chart reviewed and note that patient was min assist for mobility with pain limitations. She does not meet justification for intensive rehab program. Will defer CIR consult.
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
## 2015-09-07 DIAGNOSIS — S36113A Laceration of liver, unspecified degree, initial encounter: Secondary | ICD-10-CM | POA: Diagnosis present

## 2015-09-07 DIAGNOSIS — D62 Acute posthemorrhagic anemia: Secondary | ICD-10-CM | POA: Diagnosis not present

## 2015-09-07 DIAGNOSIS — S92901A Unspecified fracture of right foot, initial encounter for closed fracture: Secondary | ICD-10-CM | POA: Diagnosis present

## 2015-09-07 DIAGNOSIS — S272XXA Traumatic hemopneumothorax, initial encounter: Secondary | ICD-10-CM | POA: Diagnosis present

## 2015-09-07 DIAGNOSIS — S2242XA Multiple fractures of ribs, left side, initial encounter for closed fracture: Secondary | ICD-10-CM | POA: Diagnosis present

## 2015-09-07 MED ORDER — FENTANYL 100 MCG/HR TD PT72: 200.0000 ug | MEDICATED_PATCH | TRANSDERMAL | Status: DC

## 2015-09-07 MED ORDER — OXYCODONE-ACETAMINOPHEN 10-325 MG PO TABS: 1.0000 | ORAL_TABLET | ORAL | Status: DC | PRN

## 2015-09-07 MED ORDER — TRAMADOL HCL 50 MG PO TABS: 100.0000 mg | ORAL_TABLET | Freq: Four times a day (QID) | ORAL | Status: DC
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
## 2015-09-07 NOTE — Discharge Instructions (Signed)
Do not bear weight on right foot.  No driving while taking oxycodone or while you have patch on.  Wash chest wounds daily in shower with soap and water. Do not soak. Apply antibiotic ointment (e.g. Neosporin) twice daily and as needed to keep moist. Cover with dry dressing.

## 2015-09-07 NOTE — Care Management Note (Signed)
Case Management Note  Patient Details  Name: Abigail [name] MRN: [id number] Date of Birth: 05-23-1991  Subjective/Objective:   Pt for dc home today with mother.  DME ordered as recommended by PT/OT.                   Action/Plan: Referral to [facility] for DME needs.  DME to be delivered to pt's room prior to dc home.  Pt/mother deny any additional needs for home.    Expected Discharge Date:   09/07/2015               Expected Discharge Plan:  Home/Self Care  In-House Referral:     Discharge planning Services  CM Consult  Post Acute Care Choice:    Choice offered to:     DME Arranged:  Walker rolling, Wheelchair manual, 3-N-1 DME Agency:  [facility]  HH Arranged:    HH Agency:     Status of Service:  Completed, signed off  Medicare Important Message Given:    Date Medicare IM Given:    Medicare IM give by:    Date Additional Medicare IM Given:    Additional Medicare Important Message give by:     If discussed at Long Length of Stay Meetings, dates discussed:    Additional Comments:  Quintella Baton, RN, BSN  Trauma/Neuro ICU Case Manager [phone]

## 2015-09-07 NOTE — Discharge Planning (Signed)
AVS to patient who verbalizes understanding, also give 3 rx and DME. D'cd per w/c with all personal belongings to private car home accompanied by Mother at 1220.
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
## 2015-09-07 NOTE — Clinical Social Work Note (Signed)
CSW met with patient and provided her with substance abuse resources. The patient states that she is interested in getting suboxone treatment as this has helped her in the past. Patient returning home today. CSW signing off at this time.   Liz Beach MSW, [employer] [place], [place], [phone]
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
## 2015-09-09 ENCOUNTER — Encounter (HOSPITAL_COMMUNITY): Payer: Self-pay

## 2015-09-09 ENCOUNTER — Emergency Department (HOSPITAL_COMMUNITY)
Admission: EM | Admit: 2015-09-09 | Discharge: 2015-09-09 | Disposition: A | Discharge status: Home / Self Care | Payer: Medicaid Other | Attending: Emergency Medicine | Admitting: Emergency Medicine

## 2015-09-09 DIAGNOSIS — R0789 Other chest pain: Secondary | ICD-10-CM | POA: Diagnosis not present

## 2015-09-09 DIAGNOSIS — Z880 Allergy status to penicillin: Secondary | ICD-10-CM | POA: Diagnosis not present

## 2015-09-09 DIAGNOSIS — Z8781 Personal history of (healed) traumatic fracture: Secondary | ICD-10-CM | POA: Diagnosis not present

## 2015-09-09 DIAGNOSIS — Z8619 Personal history of other infectious and parasitic diseases: Secondary | ICD-10-CM | POA: Diagnosis not present

## 2015-09-09 DIAGNOSIS — R52 Pain, unspecified: Secondary | ICD-10-CM

## 2015-09-09 DIAGNOSIS — Z91040 Latex allergy status: Secondary | ICD-10-CM | POA: Insufficient documentation

## 2015-09-09 DIAGNOSIS — Z87891 Personal history of nicotine dependence: Secondary | ICD-10-CM | POA: Insufficient documentation

## 2015-09-09 DIAGNOSIS — F419 Anxiety disorder, unspecified: Secondary | ICD-10-CM | POA: Insufficient documentation

## 2015-09-09 DIAGNOSIS — D649 Anemia, unspecified: Secondary | ICD-10-CM | POA: Diagnosis not present

## 2015-09-09 DIAGNOSIS — Z8742 Personal history of other diseases of the female genital tract: Secondary | ICD-10-CM | POA: Insufficient documentation

## 2015-09-09 DIAGNOSIS — F329 Major depressive disorder, single episode, unspecified: Secondary | ICD-10-CM | POA: Diagnosis not present

## 2015-09-09 DIAGNOSIS — G8929 Other chronic pain: Secondary | ICD-10-CM | POA: Diagnosis not present

## 2015-09-09 DIAGNOSIS — Z8709 Personal history of other diseases of the respiratory system: Secondary | ICD-10-CM | POA: Insufficient documentation

## 2015-09-09 DIAGNOSIS — M79604 Pain in right leg: Secondary | ICD-10-CM | POA: Insufficient documentation

## 2015-09-09 DIAGNOSIS — Z87828 Personal history of other (healed) physical injury and trauma: Secondary | ICD-10-CM | POA: Diagnosis not present

## 2015-09-09 DIAGNOSIS — Z79899 Other long term (current) drug therapy: Secondary | ICD-10-CM | POA: Insufficient documentation

## 2015-09-09 HISTORY — DX: Laceration of liver, unspecified degree, initial encounter: S36.113A

## 2015-09-09 HISTORY — DX: Fracture of one rib, unspecified side, initial encounter for closed fracture: S22.39XA

## 2015-09-09 HISTORY — DX: Pneumothorax, unspecified: J93.9

## 2015-09-09 HISTORY — DX: Multiple fractures of ribs, unspecified side, initial encounter for closed fracture: S22.49XA

## 2015-09-09 HISTORY — DX: Anemia, unspecified: D64.9

## 2015-09-09 MED ORDER — HYDROMORPHONE HCL 2 MG/ML IJ SOLN
1.5000 mg | Freq: Once | INTRAMUSCULAR | Status: AC
  Administered 2015-09-09: 1.5 mg via INTRAMUSCULAR
  Filled 2015-09-09: qty 1

## 2015-09-09 MED ORDER — PROMETHAZINE HCL 25 MG/ML IJ SOLN
12.5000 mg | Freq: Once | INTRAMUSCULAR | Status: AC
  Administered 2015-09-09: 12.5 mg via INTRAMUSCULAR
  Filled 2015-09-09: qty 1

## 2015-09-09 MED ORDER — IBUPROFEN 400 MG PO TABS
400.0000 mg | ORAL_TABLET | Freq: Once | ORAL | Status: AC
  Administered 2015-09-09: 400 mg via ORAL
  Filled 2015-09-09: qty 1

## 2015-09-09 NOTE — ED Notes (Signed)
MD at bedside. 

## 2015-09-09 NOTE — Discharge Instructions (Signed)
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
## 2015-09-10 ENCOUNTER — Telehealth (HOSPITAL_COMMUNITY): Payer: Self-pay | Admitting: Orthopedic Surgery

## 2015-09-10 NOTE — Telephone Encounter (Signed)
Faxed PA form to [facility]

## 2015-09-12 ENCOUNTER — Telehealth (HOSPITAL_COMMUNITY): Payer: Self-pay | Admitting: Orthopedic Surgery

## 2015-09-12 NOTE — Telephone Encounter (Signed)
Left message

## 2015-09-14 ENCOUNTER — Telehealth: Payer: Self-pay | Admitting: Obstetrics and Gynecology

## 2015-09-14 NOTE — Telephone Encounter (Signed)
Phone call to pt reminding her that ob care still needed since pregnancy being continued. Pt partner agrees to make appt.

## 2015-09-17 ENCOUNTER — Other Ambulatory Visit: Payer: Self-pay | Admitting: Obstetrics and Gynecology

## 2015-09-18 ENCOUNTER — Other Ambulatory Visit: Payer: Self-pay | Admitting: Obstetrics and Gynecology

## 2015-09-18 DIAGNOSIS — Z1389 Encounter for screening for other disorder: Secondary | ICD-10-CM

## 2015-09-19 ENCOUNTER — Encounter: Payer: Medicaid Other | Admitting: Obstetrics and Gynecology

## 2015-09-19 ENCOUNTER — Other Ambulatory Visit: Payer: Medicaid Other

## 2015-09-19 ENCOUNTER — Encounter: Payer: Self-pay | Admitting: *Deleted
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
## 2015-10-14 ENCOUNTER — Encounter (HOSPITAL_COMMUNITY): Payer: Self-pay | Admitting: *Deleted

## 2015-10-14 ENCOUNTER — Emergency Department (HOSPITAL_COMMUNITY)
Admission: EM | Admit: 2015-10-14 | Discharge: 2015-10-15 | Payer: Medicaid Other | Attending: Emergency Medicine | Admitting: Emergency Medicine

## 2015-10-14 DIAGNOSIS — F1910 Other psychoactive substance abuse, uncomplicated: Secondary | ICD-10-CM

## 2015-10-14 DIAGNOSIS — F4310 Post-traumatic stress disorder, unspecified: Secondary | ICD-10-CM | POA: Diagnosis not present

## 2015-10-14 DIAGNOSIS — F1010 Alcohol abuse, uncomplicated: Secondary | ICD-10-CM | POA: Diagnosis not present

## 2015-10-14 DIAGNOSIS — Z87891 Personal history of nicotine dependence: Secondary | ICD-10-CM | POA: Diagnosis not present

## 2015-10-14 DIAGNOSIS — F319 Bipolar disorder, unspecified: Secondary | ICD-10-CM | POA: Insufficient documentation

## 2015-10-14 DIAGNOSIS — Z8781 Personal history of (healed) traumatic fracture: Secondary | ICD-10-CM | POA: Insufficient documentation

## 2015-10-14 DIAGNOSIS — O26892 Other specified pregnancy related conditions, second trimester: Secondary | ICD-10-CM | POA: Diagnosis not present

## 2015-10-14 DIAGNOSIS — Z3A23 23 weeks gestation of pregnancy: Secondary | ICD-10-CM | POA: Diagnosis not present

## 2015-10-14 DIAGNOSIS — O99322 Drug use complicating pregnancy, second trimester: Secondary | ICD-10-CM

## 2015-10-14 DIAGNOSIS — Z008 Encounter for other general examination: Secondary | ICD-10-CM

## 2015-10-14 NOTE — ED Notes (Addendum)
Pt went to jail today and states she is withdrawing from percocets that she "shoots up" and xanax. Pt denies si/hi but states "I can say that if it will get me into a detox place." Pt is also [redacted] weeks pregnant. Lenon AhmadiJailer states they are requesting to make sure "the baby is fine and detox for the patient."

## 2015-10-15 LAB — RAPID URINE DRUG SCREEN, HOSP PERFORMED
Amphetamines: NOT DETECTED
BARBITURATES: NOT DETECTED
Benzodiazepines: POSITIVE — AB
Cocaine: NOT DETECTED
Opiates: POSITIVE — AB
Tetrahydrocannabinol: POSITIVE — AB

## 2015-10-15 LAB — ETHANOL

## 2015-10-15 NOTE — ED Provider Notes (Signed)
CSN: [id number]     Arrival date & time 10/14/15  2235 History   First MD Initiated Contact with Patient 10/14/15 2331     Chief Complaint  Patient presents with  . Medical Clearance     (Consider location/radiation/quality/duration/timing/severity/associated sxs/prior Treatment) HPI  This is a G3 P1 female approximately 23 weeks and 2 days pregnant who presents for medical clearance. Patient is currently in police custody. She is a daily IV drug user and continues to take Xanax. She is being transferred to [facility] and they're requesting medical clearance. Patient is currently without complaint. She states that she "shoots up" Dilaudid daily and she takes 2 mg of Xanax 4 times a day daily. She was previously obtaining Xanax from her OB/GYN who would no longer prescribe for her so now she is "getting off the street." She denies SI or HI. She endorses good fetal movement. She denies any abdominal pain, loss of fluid, vaginal bleeding. Denies any complications during this pregnancy.  I have reviewed the patient's chart. She last saw Dr. Emelda Fear in January. At that time, she was resistant to treatment and was no longer being prescribed scheduled drugs. She did have a dating ultrasound with an estimated due date of 02/09/2016.   Past Medical History  Diagnosis Date  . Polysubstance abuse     opiods, cocaine, marijuana, heroin  . Chronic back pain   . Substance induced mood disorder (HCC)   . Bipolar 1 disorder (HCC)   . PTSD (post-traumatic stress disorder)   . Depression   . Anxiety   . PTSD (post-traumatic stress disorder)   . BV (bacterial vaginosis) 07/17/2015  . Drug abuse 07/17/2015  . Anemia   . Fracture, ribs leftside  . Pneumothorax left  . Liver laceration    Past Surgical History  Procedure Laterality Date  . Bladder surgery    . Tonsillectomy    . Adenoidectomy    . Orif ankle fracture Right 09/01/2015    Procedure: OPEN REDUCTION INTERNAL FIXATION (ORIF)  OPEN FOOT  FRACTURES DISLOCATION;  Surgeon: Myrene Galas, MD;  Location: [facility] OR;  Service: Orthopedics;  Laterality: Right;   Family History  Problem Relation Age of Onset  . Anxiety disorder Mother   . Miscarriages / [place] Mother   . Hypertension Father   . Heart disease Father   . Depression Father   . Bipolar disorder Father   . Thyroid disease Father   . Anxiety disorder Father   . Early death Father   . Depression Brother   . Heart disease Brother   . Hypertension Brother   . Diabetes Brother     borderline  . Anxiety disorder Brother   . Bipolar disorder Brother   . Thyroid disease Brother   . Diabetes Maternal Grandmother   . Heart disease Maternal Grandmother   . Diabetes Maternal Grandfather   . Heart disease Maternal Grandfather    Social History  Substance Use Topics  . Smoking status: Former Smoker -- 0.00 packs/day for 7 years    Types: Cigarettes    Quit date: 09/01/2015  . Smokeless tobacco: Never Used  . Alcohol Use: No     Comment: occ weekends; not now   OB History    Gravida Para Term Preterm AB TAB SAB Ectopic Multiple Living   Review of Systems  Respiratory: Negative for chest tightness and shortness of breath.   Cardiovascular: Negative for  chest pain.  Gastrointestinal: Positive for nausea. Negative for abdominal pain.  Genitourinary: Negative for dysuria, vaginal bleeding and vaginal discharge.  All other systems reviewed and are negative.     Allergies  Latex and Penicillins  Home Medications   Prior to Admission medications   Medication Sig Start Date End Date Taking? Authorizing Provider  ALPRAZolam Prudy Feeler) 1 MG tablet Take 1 tablet (1 mg total) by mouth 4 (four) times daily. Patient taking differently: Take 1 mg by mouth daily as needed for anxiety.  08/15/15   Tilda Burrow, MD  fentaNYL (DURAGESIC - DOSED MCG/HR) 100 MCG/HR Place 2 patches (200 mcg total) onto the skin every 3 (three) days. 09/08/15   Freeman Caldron, PA-C  oxyCODONE-acetaminophen (PERCOCET) 10-325 MG tablet Take 1-2 tablets by mouth every 4 (four) hours as needed for pain. 09/07/15   Freeman Caldron, PA-C  Prenatal Vit-Fe Fumarate-FA (MULTIVITAMIN-PRENATAL) 27-0.8 MG TABS tablet Take 1 tablet by mouth daily at 12 noon.    Historical Provider, MD  promethazine (PHENERGAN) 25 MG tablet TAKE 1 TABLET BY MOUTH EVERY SIX HOURS AS NEEDED FOR NAUSEA/VOMITING. 09/17/15   Tilda Burrow, MD  traMADol (ULTRAM) 50 MG tablet Take 2 tablets (100 mg total) by mouth every 6 (six) hours. 09/07/15   Freeman Caldron, PA-C   BP 112/60 mmHg  Pulse 94  Temp(Src) 98.1 F (36.7 C) (Oral)  Resp 18  Ht  (1.6 m)  Wt 140 lb (63.504 kg)  BMI 24.81 kg/m2  SpO2 100%  LMP 05/05/2015 (Exact Date) Physical Exam  Constitutional: She is oriented to person, place, and time. No distress.  HENT:  Head: Normocephalic and atraumatic.  Cardiovascular: Normal rate, regular rhythm and normal heart sounds.   No murmur heard. Pulmonary/Chest: Effort normal and breath sounds normal. No respiratory distress. She has no wheezes.  Abdominal: Soft. Bowel sounds are normal. There is no tenderness. There is no rebound.  Gravid to the umbilicus  Neurological: She is alert and oriented to person, place, and time.  Skin: Skin is warm and dry.  Psychiatric: She has a normal mood and affect.  Nursing note and vitals reviewed.   ED Course  Procedures (including critical care time)  EMERGENCY DEPARTMENT [place] PREGNANCY "Study: Limited Ultrasound of the Pelvis"  INDICATIONS:Pregnancy(required) Multiple views of the uterus and pelvic cavity are obtained with a multi-frequency probe.  APPROACH:Transabdominal   PERFORMED BY: Myself  IMAGES ARCHIVED?: Yes  LIMITATIONS: Emergent procedure  PREGNANCY FREE FLUID: None  PREGNANCY UTERUS FINDINGS:Uterus enlarged ADNEXAL FINDINGS: not visualized  PREGNANCY FINDINGS: Fetal heart activity seen  INTERPRETATION: Viable  intrauterine pregnancy  GESTATIONAL AGE, ESTIMATE: 2nd trimester  FETAL HEART RATE: 158  COMMENT(Estimate of Gestational Age):  Patient with an approximate second trimester gestation, fetal heart rate 158, additional fetal measurements not obtained, good fetal movement noted on exam    Labs Review Labs Reviewed  URINE RAPID DRUG SCREEN, HOSP PERFORMED - Abnormal; Notable for the following:    Opiates POSITIVE (*)    Benzodiazepines POSITIVE (*)    Tetrahydrocannabinol POSITIVE (*)    All other components within normal limits  ETHANOL    Imaging Review No results found. I have personally reviewed and evaluated these images and lab results as part of my medical decision-making.   EKG Interpretation None      MDM   Final diagnoses:  Medical clearance for incarceration  Polysubstance abuse  High risk pregnancy due to maternal drug abuse, second trimester  Patient presents for medical clearance. History of opiate and Xanax abuse. Has not seen Dr. Emelda FearFerguson since January. I discussed the patient with the nurse [facility]. In discussion with the physician there, they are requesting ultrasound for fetal viability and urine drug screen. At this time, the patient has no evidence of withdrawal; however, she would be at risk for benzodiazepine withdrawal if not tapered.  Bedside ultrasound shows reassuring fetal heart tones. This was a limited exam but does show a viable pregnancy. UDS positive for benzodiazepines, opiates, and marijuana. [place] facility was updated. I discussed with the nurse that the patient takes daily Xanax and will need to be tapered and this was also placed on her discharge instructions.  After history, exam, and medical workup I feel the patient has been appropriately medically screened and is safe for discharge home. Pertinent diagnoses were discussed with the patient. Patient was given return  precautions.     Shon Batonourtney F Horton, MD 10/15/15 [phone]

## 2015-10-15 NOTE — Discharge Instructions (Signed)
Patient was seen today for medical clearance for transfer to [facility]. She has no signs or symptoms of withdrawal at this time. She has good fetal movement with a good fetal heart rate. This was a limited ultrasound exam. While she has no evidence of withdrawal at this time, she does report taking Xanax daily. When stopping benzodiazepines, this can result in life-threatening withdrawal. While there is no evidence of life-threatening withdrawal at this time, she should be monitored closely and provided with appropriate medications for withdrawal.  Benzodiazepine Withdrawal  Benzodiazepines are a group of drugs that are prescribed for both short-term and long-term treatment of a variety of medical conditions. For some of these conditions, such as seizures and sudden and severe muscle spasms, they are used only for a few hours or a few days. For other conditions, such as anxiety, sleep problems, or frequent muscle spasms or to help prevent seizures, they are used for an extended period, usually weeks or months. Benzodiazepines work by changing the way your brain functions. Normally, chemicals in your brain called neurotransmitters send messages between your brain cells. The neurotransmitter that benzodiazepines affect is called gamma-aminobutyric acid (GABA). GABA sends out messages that have a calming effect on many of the functions of your brain. Benzodiazepines make these messages stronger and increase this calming effect. Short-term use of benzodiazepines usually does not cause problems when you stop taking the drugs. However, if you take benzodiazepines for a long time, your body can adjust to the drug and require more of it to produce the same effect (drug tolerance). Eventually, you can develop physical dependence on benzodiazepines, which is when you experience negative effects if your dosage of benzodiazepines is reduced or stopped too quickly. These negative effects are called symptoms of  withdrawal. SYMPTOMS Symptoms of withdrawal may begin anytime within the first 10 days after you stop taking the benzodiazepine. They can last from several weeks up to a few months but usually are the worst between the first 10 to 14 days.  The actual symptoms also vary, depending on the type of benzodiazepine you take. Possible symptoms include:  Anxiety.  Excitability.  Irritability.  Depression.  Mood swings.  Trouble sleeping.  Confusion.  Uncontrollable shaking (tremors).  Muscle weakness.  Seizures. DIAGNOSIS To diagnose benzodiazepine withdrawal, your caregiver will examine you for certain signs, such as:  Rapid heartbeat.  Rapid breathing.  Tremors.  High blood pressure.  Fever.  Mood changes. Your caregiver also may ask the following questions about your use of benzodiazepines:  What type of benzodiazepine did you take?  How much did you take each day?  How long did you take the drug?  When was the last time you took the drug?  Do you take any other drugs?  Have you had alcohol recently?  Have you had a seizure recently?  Have you lost consciousness recently?  Have you had trouble remembering recent events?  Have you had a recent increase in anxiety, irritability, or trouble sleeping? A drug test also may be administered. TREATMENT The treatment for benzodiazepine withdrawal can vary, depending on the type and severity of your symptoms, what type of benzodiazepine you have been taking, and how long you have been taking the benzodiazepine. Sometimes it is necessary for you to be treated in a hospital, especially if you are at risk of seizures.  Often, treatment includes a prescription for a long-acting benzodiazepine, the dosage of which is reduced slowly over a long period. This period could be several weeks  or months. Eventually, your dosage will be reduced to a point that you can stop taking the drug, without experiencing withdrawal symptoms.  This is called tapered withdrawal. Occasionally, minor symptoms of withdrawal continue for a few days or weeks after you have completed a tapered withdrawal. SEEK IMMEDIATE MEDICAL CARE IF:  You have a seizure.  You develop a craving for drugs or alcohol.  You begin to experience symptoms of withdrawal during your tapered withdrawal.  You become very confused.  You lose consciousness.  You have trouble breathing.  You think about hurting yourself or someone else.   This information is not intended to replace advice given to you by your health care provider. Make sure you discuss any questions you have with your health care provider.   Document Released: 07/17/2011 Document Revised: 08/18/2014 Document Reviewed: 01/17/2015 Elsevier Interactive Patient Education [employer]2016 [employer]

## 2015-12-06 ENCOUNTER — Ambulatory Visit (INDEPENDENT_AMBULATORY_CARE_PROVIDER_SITE_OTHER): Payer: Medicaid Other | Admitting: Advanced Practice Midwife

## 2015-12-06 ENCOUNTER — Encounter: Payer: Self-pay | Admitting: Advanced Practice Midwife

## 2015-12-06 VITALS — BP 120/60 | HR 74 | Wt 149.5 lb

## 2015-12-06 DIAGNOSIS — Z1389 Encounter for screening for other disorder: Secondary | ICD-10-CM

## 2015-12-06 DIAGNOSIS — O0993 Supervision of high risk pregnancy, unspecified, third trimester: Secondary | ICD-10-CM

## 2015-12-06 DIAGNOSIS — O0992 Supervision of high risk pregnancy, unspecified, second trimester: Secondary | ICD-10-CM

## 2015-12-06 DIAGNOSIS — R768 Other specified abnormal immunological findings in serum: Secondary | ICD-10-CM | POA: Insufficient documentation

## 2015-12-06 DIAGNOSIS — Z331 Pregnant state, incidental: Secondary | ICD-10-CM

## 2015-12-06 DIAGNOSIS — Z3A31 31 weeks gestation of pregnancy: Secondary | ICD-10-CM | POA: Diagnosis not present

## 2015-12-06 DIAGNOSIS — F1910 Other psychoactive substance abuse, uncomplicated: Secondary | ICD-10-CM

## 2015-12-06 LAB — POCT URINALYSIS DIPSTICK
Blood, UA: NEGATIVE
GLUCOSE UA: NEGATIVE
Ketones, UA: NEGATIVE
NITRITE UA: NEGATIVE
Protein, UA: NEGATIVE

## 2015-12-06 MED ORDER — PNV PRENATAL PLUS MULTIVITAMIN 27-1 MG PO TABS: 1.0000 | ORAL_TABLET | Freq: Every day | ORAL | Status: DC
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
## 2015-12-10 ENCOUNTER — Other Ambulatory Visit: Payer: Medicaid Other

## 2015-12-10 DIAGNOSIS — Z131 Encounter for screening for diabetes mellitus: Secondary | ICD-10-CM

## 2015-12-10 DIAGNOSIS — Z369 Encounter for antenatal screening, unspecified: Secondary | ICD-10-CM

## 2015-12-11 LAB — CBC
HEMOGLOBIN: 10.4 g/dL — AB (ref 11.1–15.9)
Hematocrit: 32 % — ABNORMAL LOW (ref 34.0–46.6)
MCH: 27.3 pg (ref 26.6–33.0)
MCHC: 32.5 g/dL (ref 31.5–35.7)
MCV: 84 fL (ref 79–97)
Platelets: 226 10*3/uL (ref 150–379)
RBC: 3.81 x10E6/uL (ref 3.77–5.28)
RDW: 14.7 % (ref 12.3–15.4)
WBC: 10.4 10*3/uL (ref 3.4–10.8)

## 2015-12-11 LAB — GLUCOSE TOLERANCE, 2 HOURS W/ 1HR
GLUCOSE, 1 HOUR: 136 mg/dL (ref 65–179)
GLUCOSE, 2 HOUR: 90 mg/dL (ref 65–152)
Glucose, Fasting: 84 mg/dL (ref 65–91)

## 2015-12-11 LAB — HIV ANTIBODY (ROUTINE TESTING W REFLEX): HIV SCREEN 4TH GENERATION: NONREACTIVE

## 2015-12-11 LAB — ANTIBODY SCREEN: Antibody Screen: NEGATIVE

## 2015-12-11 LAB — RPR: RPR: NONREACTIVE

## 2015-12-19 ENCOUNTER — Ambulatory Visit (INDEPENDENT_AMBULATORY_CARE_PROVIDER_SITE_OTHER): Payer: Medicaid Other | Admitting: Obstetrics and Gynecology

## 2015-12-19 ENCOUNTER — Encounter: Payer: Self-pay | Admitting: Obstetrics and Gynecology

## 2015-12-19 VITALS — BP 120/70 | HR 111 | Wt 156.0 lb

## 2015-12-19 DIAGNOSIS — Z1389 Encounter for screening for other disorder: Secondary | ICD-10-CM

## 2015-12-19 DIAGNOSIS — F1910 Other psychoactive substance abuse, uncomplicated: Secondary | ICD-10-CM

## 2015-12-19 DIAGNOSIS — Z3A35 35 weeks gestation of pregnancy: Secondary | ICD-10-CM | POA: Diagnosis not present

## 2015-12-19 DIAGNOSIS — Z331 Pregnant state, incidental: Secondary | ICD-10-CM | POA: Diagnosis not present

## 2015-12-19 DIAGNOSIS — O99323 Drug use complicating pregnancy, third trimester: Secondary | ICD-10-CM

## 2015-12-19 DIAGNOSIS — O0993 Supervision of high risk pregnancy, unspecified, third trimester: Secondary | ICD-10-CM

## 2015-12-19 LAB — POCT URINALYSIS DIPSTICK
Glucose, UA: NEGATIVE
Ketones, UA: NEGATIVE
Leukocytes, UA: NEGATIVE
Protein, UA: NEGATIVE
RBC UA: NEGATIVE

## 2015-12-19 NOTE — Progress Notes (Signed)
Pt denies any problems or concerns at this time.  

## 2015-12-19 NOTE — Progress Notes (Addendum)
Patient ID: Abigail [name], female   DOB: 06/04/1991, 25 y.o.   MRN: [id number]   High Risk Pregnancy Diagnosis(es):   Polysubstance Abuse with Suboxone managed elsewhere  G3P1011 6657w4d Estimated Date of Delivery: 02/09/16    HPI: The patient is being seen today for ongoing management of high-risk pregnancy due to polysubstance abuse. Today she reports remaining in control with no relapse of IVDU. Patient is receiving support [facility] in [place].  Patient requests a physical exam and Tb test. Patient reports her medical records are obtainable from:  [facility] [address], [place], [place] [facility] Records: [phone]  Patient reports good fetal movement, denies any bleeding and no rupture of membranes symptoms or regular contractions.   BP weight and urine results reviewed and noted. Blood pressure 120/70, pulse 111, weight 156 lb (70.761 kg), last menstrual period 05/05/2015.  Fetal Surveillance Testing today:  none Fundal Height:  31 cm Fetal Heart rate:  131 bpm Edema:  n/a Urinalysis: Negative   Questions were answered.  Lab and sonogram results have been reviewed. Comments: not done   Assessment:  1.  Pregnancy at 3757w4d,  Estimated Date of Delivery: 02/09/16 :                          2.  History of polysubstance abuse, with suboxone managed elsewhere.                        3. Hx Hep C with need for confirmation of current viral load.                        4  Unstable housing, to go to [facility] in [place], may need to go [facility] [place] in the future. Medication(s) Plans:  unchanged  Treatment Plan:  To draw Hep C viral testing Friday when she brings paper for [facility] application  Follow up in 1 weeks for appointment for high risk OB care,     By signing my name below, I, Ronney LionSuzanne Le, attest that this documentation has been prepared under the direction and in the presence of Tilda BurrowJohn Chaley Castellanos V,  MD. Electronically Signed: Ronney LionSuzanne Le, ED Scribe. 12/19/2015. 3:44 PM.  I personally performed the services described in this documentation, which was SCRIBED in my presence. The recorded information has been reviewed and considered accurate. It has been edited as necessary during review. Tilda BurrowFERGUSON,Temima Kutsch V, MD

## 2015-12-20 LAB — PMP SCREEN PROFILE (10S), URINE
Amphetamine Screen, Ur: NEGATIVE ng/mL
BENZODIAZEPINE SCREEN, URINE: NEGATIVE ng/mL
Barbiturate Screen, Ur: NEGATIVE ng/mL
COCAINE(METAB.) SCREEN, URINE: NEGATIVE ng/mL
CREATININE(CRT), U: 41.1 mg/dL (ref 20.0–300.0)
Cannabinoids Ur Ql Scn: NEGATIVE ng/mL
Methadone Scn, Ur: NEGATIVE ng/mL
OPIATE SCRN UR: NEGATIVE ng/mL
OXYCODONE+OXYMORPHONE UR QL SCN: NEGATIVE ng/mL
PCP Scrn, Ur: NEGATIVE ng/mL
Ph of Urine: 6.4 (ref 4.5–8.9)
Propoxyphene, Screen: NEGATIVE ng/mL

## 2015-12-21 ENCOUNTER — Telehealth: Payer: Self-pay | Admitting: Obstetrics and Gynecology

## 2015-12-21 NOTE — Telephone Encounter (Signed)
Pt states that she would like for Dr. Emelda FearFerguson to write her a note stating that she is not able to travel for her court dates, because she is going to be 8 to nine months when her court dates are. One is may 24th and the other will be June the 14th. Pt would like this done by 2 o'clock today. [place]

## 2015-12-21 NOTE — Telephone Encounter (Signed)
Per Dr. Emelda FearFerguson, "not in the practice of given pt note to excuse from court dates." Pt informed.

## 2015-12-24 LAB — HEPATITIS C GENOTYPE

## 2015-12-24 LAB — HEPATITIS C VRS RNA DETECT BY PCR-QUAL: HCV RNA NAA QUALITATIVE: NEGATIVE

## 2015-12-28 ENCOUNTER — Telehealth: Payer: Self-pay | Admitting: *Deleted

## 2016-01-01 NOTE — Telephone Encounter (Signed)
Patient was given note. 

## 2016-01-02 ENCOUNTER — Encounter: Payer: Medicaid Other | Admitting: Obstetrics and Gynecology

## 2016-01-09 ENCOUNTER — Encounter (HOSPITAL_COMMUNITY): Payer: Self-pay

## 2016-01-09 ENCOUNTER — Inpatient Hospital Stay (HOSPITAL_COMMUNITY)
Admission: AD | Admit: 2016-01-09 | Discharge: 2016-01-09 | Disposition: A | Discharge status: Home / Self Care | Payer: Medicaid Other | Source: Ambulatory Visit | Attending: Obstetrics & Gynecology | Admitting: Obstetrics & Gynecology

## 2016-01-09 DIAGNOSIS — Z3A35 35 weeks gestation of pregnancy: Secondary | ICD-10-CM | POA: Diagnosis not present

## 2016-01-09 DIAGNOSIS — O9989 Other specified diseases and conditions complicating pregnancy, childbirth and the puerperium: Secondary | ICD-10-CM

## 2016-01-09 DIAGNOSIS — Z87891 Personal history of nicotine dependence: Secondary | ICD-10-CM | POA: Insufficient documentation

## 2016-01-09 DIAGNOSIS — R109 Unspecified abdominal pain: Secondary | ICD-10-CM | POA: Diagnosis present

## 2016-01-09 DIAGNOSIS — O26893 Other specified pregnancy related conditions, third trimester: Secondary | ICD-10-CM | POA: Insufficient documentation

## 2016-01-09 DIAGNOSIS — F1910 Other psychoactive substance abuse, uncomplicated: Secondary | ICD-10-CM | POA: Diagnosis not present

## 2016-01-09 DIAGNOSIS — Z880 Allergy status to penicillin: Secondary | ICD-10-CM | POA: Insufficient documentation

## 2016-01-09 DIAGNOSIS — N898 Other specified noninflammatory disorders of vagina: Secondary | ICD-10-CM | POA: Diagnosis not present

## 2016-01-09 DIAGNOSIS — O99323 Drug use complicating pregnancy, third trimester: Secondary | ICD-10-CM | POA: Diagnosis not present

## 2016-01-09 DIAGNOSIS — R102 Pelvic and perineal pain: Secondary | ICD-10-CM | POA: Diagnosis not present

## 2016-01-09 DIAGNOSIS — O0993 Supervision of high risk pregnancy, unspecified, third trimester: Secondary | ICD-10-CM

## 2016-01-09 DIAGNOSIS — A749 Chlamydial infection, unspecified: Secondary | ICD-10-CM

## 2016-01-09 DIAGNOSIS — R768 Other specified abnormal immunological findings in serum: Secondary | ICD-10-CM

## 2016-01-09 DIAGNOSIS — O98819 Other maternal infectious and parasitic diseases complicating pregnancy, unspecified trimester: Secondary | ICD-10-CM

## 2016-01-09 LAB — URINALYSIS, ROUTINE W REFLEX MICROSCOPIC
BILIRUBIN URINE: NEGATIVE
GLUCOSE, UA: NEGATIVE mg/dL
Hgb urine dipstick: NEGATIVE
KETONES UR: NEGATIVE mg/dL
Nitrite: NEGATIVE
Protein, ur: NEGATIVE mg/dL
pH: 6 (ref 5.0–8.0)

## 2016-01-09 LAB — WET PREP, GENITAL
Sperm: NONE SEEN
Trich, Wet Prep: NONE SEEN
YEAST WET PREP: NONE SEEN

## 2016-01-09 LAB — URINE MICROSCOPIC-ADD ON: RBC / HPF: NONE SEEN RBC/hpf (ref 0–5)

## 2016-01-09 NOTE — MAU Note (Signed)
Urine in lab 

## 2016-01-09 NOTE — Discharge Instructions (Signed)
Your wet prep returned with possible bacterial vaginosis. Please try to increase the amount of yogurt you eat over the next several days. If the discharge is not improved when you follow up at your Ob/Gyn, they may consider an oral medication.  Your pain is due to your round ligament. You may take Tylenol as needed for pain. You do not need to do bed rest for this condition. More information below.  Round Ligament Pain The round ligament is a cord of muscle and tissue that helps to support the uterus. It can become a source of pain during pregnancy if it becomes stretched or twisted as the baby grows. The pain usually begins in the second trimester of pregnancy, and it can come and go until the baby is delivered. It is not a serious problem, and it does not cause harm to the baby. Round ligament pain is usually a short, sharp, and pinching pain, but it can also be a dull, lingering, and aching pain. The pain is felt in the lower side of the abdomen or in the groin. It usually starts deep in the groin and moves up to the outside of the hip area. Pain can occur with:  A sudden change in position.  Rolling over in bed.  Coughing or sneezing.  Physical activity. HOME CARE INSTRUCTIONS Watch your condition for any changes. Take these steps to help with your pain:  When the pain starts, relax. Then try:  Sitting down.  Flexing your knees up to your abdomen.  Lying on your side with one pillow under your abdomen and another pillow between your legs.  Sitting in a warm bath for 15-20 minutes or until the pain goes away.  Take over-the-counter and prescription medicines only as told by your health care provider.  Move slowly when you sit and stand.  Avoid long walks if they cause pain.  Stop or lessen your physical activities if they cause pain. SEEK MEDICAL CARE IF:  Your pain does not go away with treatment.  You feel pain in your back that you did not have before.  Your medicine is  not helping. SEEK IMMEDIATE MEDICAL CARE IF:  You develop a fever or chills.  You develop uterine contractions.  You develop vaginal bleeding.  You develop nausea or vomiting.  You develop diarrhea.  You have pain when you urinate.   This information is not intended to replace advice given to you by your health care provider. Make sure you discuss any questions you have with your health care provider.   Document Released: 05/06/2008 Document Revised: 10/20/2011 Document Reviewed: 10/04/2014 Elsevier Interactive Patient Education [employer]2016 Elsevier Inc.
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
## 2016-01-09 NOTE — MAU Note (Signed)
Patient presents to mau with c/o lower abdominal and groin pain that has been going on for a week; 7/8 on numeric pain scale when it happens. Denies LOF, VB at this time. +FM. Endorses having whitish yellow discharge the last two days.

## 2016-01-10 LAB — GC/CHLAMYDIA PROBE AMP (~~LOC~~) NOT AT ARMC
Chlamydia: NEGATIVE
Neisseria Gonorrhea: NEGATIVE

## 2016-01-13 LAB — OB RESULTS CONSOLE GBS: STREP GROUP B AG: NEGATIVE

## 2016-01-14 ENCOUNTER — Telehealth: Payer: Self-pay

## 2016-01-14 ENCOUNTER — Encounter: Payer: Self-pay | Admitting: Obstetrics and Gynecology

## 2016-01-14 ENCOUNTER — Ambulatory Visit (INDEPENDENT_AMBULATORY_CARE_PROVIDER_SITE_OTHER): Payer: Medicaid Other | Admitting: Obstetrics and Gynecology

## 2016-01-14 VITALS — BP 124/76 | HR 79 | Wt 164.5 lb

## 2016-01-14 DIAGNOSIS — F1994 Other psychoactive substance use, unspecified with psychoactive substance-induced mood disorder: Secondary | ICD-10-CM | POA: Diagnosis not present

## 2016-01-14 DIAGNOSIS — O98313 Other infections with a predominantly sexual mode of transmission complicating pregnancy, third trimester: Secondary | ICD-10-CM

## 2016-01-14 DIAGNOSIS — O0993 Supervision of high risk pregnancy, unspecified, third trimester: Secondary | ICD-10-CM

## 2016-01-14 DIAGNOSIS — R768 Other specified abnormal immunological findings in serum: Secondary | ICD-10-CM

## 2016-01-14 DIAGNOSIS — A749 Chlamydial infection, unspecified: Secondary | ICD-10-CM | POA: Diagnosis not present

## 2016-01-14 DIAGNOSIS — O98819 Other maternal infectious and parasitic diseases complicating pregnancy, unspecified trimester: Secondary | ICD-10-CM

## 2016-01-14 DIAGNOSIS — O99323 Drug use complicating pregnancy, third trimester: Secondary | ICD-10-CM | POA: Diagnosis not present

## 2016-01-14 LAB — POCT URINALYSIS DIP (DEVICE)
BILIRUBIN URINE: NEGATIVE
GLUCOSE, UA: NEGATIVE mg/dL
HGB URINE DIPSTICK: NEGATIVE
Ketones, ur: NEGATIVE mg/dL
NITRITE: NEGATIVE
PROTEIN: NEGATIVE mg/dL
SPECIFIC GRAVITY, URINE: 1.02 (ref 1.005–1.030)
UROBILINOGEN UA: 0.2 mg/dL (ref 0.0–1.0)
pH: 7 (ref 5.0–8.0)
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
## 2016-01-14 NOTE — Progress Notes (Signed)
36 wk cultures today  New OB/28 wk packet given today

## 2016-01-14 NOTE — Telephone Encounter (Signed)
Called pt and was unable to LM due to VM not set up.  Dr. Vergie LivingPickens wants to verify if patient has had a anatomy since she started care while incarcerated.

## 2016-01-16 LAB — CULTURE, BETA STREP (GROUP B ONLY)

## 2016-01-21 ENCOUNTER — Encounter: Payer: Self-pay | Admitting: Family Medicine

## 2016-01-22 ENCOUNTER — Telehealth: Payer: Self-pay | Admitting: General Practice

## 2016-01-22 NOTE — Telephone Encounter (Signed)
Patient called and left message stating she is returning a call about her anatomy scan. Called patient and she states she had an ultrasound with [facility] and also one in [place] in April or May. Told patient we will want her to sign a release next time she is here to request those records. Patient verbalized understanding & had no questions

## 2016-01-24 NOTE — Telephone Encounter (Signed)
Abigail [name] has spoken with patient and pt is to sign ROI for US report.

## 2016-01-26 ENCOUNTER — Encounter: Payer: Self-pay | Admitting: Obstetrics and Gynecology

## 2016-01-26 DIAGNOSIS — O0993 Supervision of high risk pregnancy, unspecified, third trimester: Secondary | ICD-10-CM

## 2016-01-28 ENCOUNTER — Inpatient Hospital Stay (HOSPITAL_COMMUNITY)
Admission: AD | Admit: 2016-01-28 | Discharge: 2016-01-31 | DRG: 775 | Disposition: A | Discharge status: Home / Self Care | Payer: Medicaid Other | Source: Ambulatory Visit | Attending: Obstetrics & Gynecology | Admitting: Obstetrics & Gynecology

## 2016-01-28 ENCOUNTER — Encounter (HOSPITAL_COMMUNITY): Payer: Self-pay | Admitting: *Deleted

## 2016-01-28 ENCOUNTER — Ambulatory Visit (INDEPENDENT_AMBULATORY_CARE_PROVIDER_SITE_OTHER): Payer: Medicaid Other | Admitting: Obstetrics & Gynecology

## 2016-01-28 VITALS — BP 128/78 | HR 79 | Wt 166.9 lb

## 2016-01-28 DIAGNOSIS — F418 Other specified anxiety disorders: Secondary | ICD-10-CM | POA: Diagnosis present

## 2016-01-28 DIAGNOSIS — Z833 Family history of diabetes mellitus: Secondary | ICD-10-CM | POA: Diagnosis not present

## 2016-01-28 DIAGNOSIS — M545 Low back pain: Secondary | ICD-10-CM | POA: Diagnosis present

## 2016-01-28 DIAGNOSIS — Z8249 Family history of ischemic heart disease and other diseases of the circulatory system: Secondary | ICD-10-CM

## 2016-01-28 DIAGNOSIS — O0993 Supervision of high risk pregnancy, unspecified, third trimester: Secondary | ICD-10-CM

## 2016-01-28 DIAGNOSIS — O99324 Drug use complicating childbirth: Secondary | ICD-10-CM | POA: Diagnosis present

## 2016-01-28 DIAGNOSIS — F1910 Other psychoactive substance abuse, uncomplicated: Secondary | ICD-10-CM | POA: Diagnosis present

## 2016-01-28 DIAGNOSIS — Z87891 Personal history of nicotine dependence: Secondary | ICD-10-CM | POA: Diagnosis not present

## 2016-01-28 DIAGNOSIS — Z3A38 38 weeks gestation of pregnancy: Secondary | ICD-10-CM

## 2016-01-28 DIAGNOSIS — Z818 Family history of other mental and behavioral disorders: Secondary | ICD-10-CM

## 2016-01-28 DIAGNOSIS — F1994 Other psychoactive substance use, unspecified with psychoactive substance-induced mood disorder: Secondary | ICD-10-CM | POA: Diagnosis present

## 2016-01-28 DIAGNOSIS — O99344 Other mental disorders complicating childbirth: Secondary | ICD-10-CM | POA: Diagnosis present

## 2016-01-28 DIAGNOSIS — F319 Bipolar disorder, unspecified: Secondary | ICD-10-CM | POA: Diagnosis present

## 2016-01-28 DIAGNOSIS — O0973 Supervision of high risk pregnancy due to social problems, third trimester: Secondary | ICD-10-CM | POA: Diagnosis present

## 2016-01-28 DIAGNOSIS — G8929 Other chronic pain: Secondary | ICD-10-CM | POA: Diagnosis present

## 2016-01-28 DIAGNOSIS — R768 Other specified abnormal immunological findings in serum: Secondary | ICD-10-CM | POA: Diagnosis present

## 2016-01-28 DIAGNOSIS — Z880 Allergy status to penicillin: Secondary | ICD-10-CM

## 2016-01-28 LAB — POCT URINALYSIS DIP (DEVICE)
Bilirubin Urine: NEGATIVE
Glucose, UA: NEGATIVE mg/dL
Hgb urine dipstick: NEGATIVE
Ketones, ur: NEGATIVE mg/dL
Nitrite: NEGATIVE
Protein, ur: NEGATIVE mg/dL
Specific Gravity, Urine: 1.015 (ref 1.005–1.030)
Urobilinogen, UA: 0.2 mg/dL (ref 0.0–1.0)
pH: 6 (ref 5.0–8.0)

## 2016-01-28 LAB — RAPID URINE DRUG SCREEN, HOSP PERFORMED
Amphetamines: NOT DETECTED
BARBITURATES: NOT DETECTED
Benzodiazepines: NOT DETECTED
Cocaine: NOT DETECTED
Opiates: NOT DETECTED
TETRAHYDROCANNABINOL: NOT DETECTED

## 2016-01-28 MED ORDER — ONDANSETRON HCL 4 MG/2ML IJ SOLN
4.0000 mg | Freq: Four times a day (QID) | INTRAMUSCULAR | Status: DC | PRN
  Administered 2016-01-29: 4 mg via INTRAVENOUS
  Filled 2016-01-28: qty 2

## 2016-01-28 MED ORDER — LIDOCAINE HCL (PF) 1 % IJ SOLN
30.0000 mL | INTRAMUSCULAR | Status: DC | PRN
  Filled 2016-01-28: qty 30

## 2016-01-28 MED ORDER — LACTATED RINGERS IV SOLN: 500.0000 mL | INTRAVENOUS | Status: DC | PRN

## 2016-01-28 MED ORDER — SOD CITRATE-CITRIC ACID 500-334 MG/5ML PO SOLN: 30.0000 mL | ORAL | Status: DC | PRN

## 2016-01-28 MED ORDER — ONDANSETRON 8 MG PO TBDP
8.0000 mg | ORAL_TABLET | Freq: Three times a day (TID) | ORAL | Status: DC | PRN
  Filled 2016-01-28: qty 1

## 2016-01-28 MED ORDER — OXYTOCIN BOLUS FROM INFUSION
500.0000 mL | INTRAVENOUS | Status: DC
  Administered 2016-01-29: 500 mL via INTRAVENOUS

## 2016-01-28 MED ORDER — OXYTOCIN 40 UNITS IN LACTATED RINGERS INFUSION - SIMPLE MED
2.5000 [IU]/h | INTRAVENOUS | Status: DC
  Administered 2016-01-29: 2.5 [IU]/h via INTRAVENOUS
  Filled 2016-01-28: qty 1000

## 2016-01-28 MED ORDER — LACTATED RINGERS IV SOLN: INTRAVENOUS | Status: DC

## 2016-01-28 MED ORDER — ACETAMINOPHEN 325 MG PO TABS: 650.0000 mg | ORAL_TABLET | ORAL | Status: DC | PRN

## 2016-01-28 NOTE — Anesthesia Pain Management Evaluation Note (Signed)
  CRNA Pain Management Visit Note  Patient: Abigail [name], 25 y.o., female  "Hello I am a member of the anesthesia team at [facility]. We have an anesthesia team available at all times to provide care throughout the hospital, including epidural management and anesthesia for C-section. I don't know your plan for the delivery whether it a natural birth, water birth, IV sedation, nitrous supplementation, doula or epidural, but we want to meet your pain goals."   1.Was your pain managed to your expectations on prior hospitalizations?   Yes   2.What is your expectation for pain management during this hospitalization?     Epidural, IV pain meds and Nitrous Oxide  3.How can we help you reach that goal? By informing me of my options for pain control. Record the patient's initial score and the patient's pain goal.   Pain: 7  Pain Goal: 8 [facility] wants you to be able to say your pain was always managed very well.  Rahil Passey 01/28/2016
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
## 2016-01-28 NOTE — Progress Notes (Signed)
Patient ID: Abigail [name], female   DOB: 12/14/1990, 25 y.o.   MRN: [id number] Abigail [name] is a 25 y.o. G3P1011 at 2153w2d.  Subjective: Contractions closer and stronger. Vomiting frequently.   Objective: BP 135/88 mmHg  Pulse 95  Temp(Src) 98.6 F (37 C) (Oral)  Resp 18  Ht 5\' 4"  (1.626 m)  Wt 166 lb (75.297 kg)  BMI 28.48 kg/m2  LMP 05/05/2015 (Exact Date)   FHT:  FHR: 125 bpm, variability: mod,  accelerations:  15x15,  decelerations:  none UC:   Q 3 minutes, moderate  Dilation: 6.5 Effacement (%): 70 Station: -1 Presentation: Vertex Exam by:: Dorathy KinsmanVirginia Genee Rann, CNM  Labs: Results for orders placed or performed during the hospital encounter of 01/28/16 (from the past 24 hour(s))  Urine rapid drug screen (hosp performed)     Status: None   Collection Time: 01/28/16  6:04 PM  Result Value Ref Range   Opiates NONE DETECTED NONE DETECTED   Cocaine NONE DETECTED NONE DETECTED   Benzodiazepines NONE DETECTED NONE DETECTED   Amphetamines NONE DETECTED NONE DETECTED   Tetrahydrocannabinol NONE DETECTED NONE DETECTED   Barbiturates NONE DETECTED NONE DETECTED    Assessment / Plan: 4153w2d week IUP Labor: Active Fetal Wellbeing:  Category I Pain Control:  May have epidural Anticipated MOD:  SVD  [place]ia Henley Blyth, CNM 01/28/2016 11:57 PM

## 2016-01-29 ENCOUNTER — Inpatient Hospital Stay (HOSPITAL_COMMUNITY): Payer: Medicaid Other | Admitting: Anesthesiology

## 2016-01-29 ENCOUNTER — Encounter (HOSPITAL_COMMUNITY): Payer: Self-pay | Admitting: *Deleted

## 2016-01-29 DIAGNOSIS — F1910 Other psychoactive substance abuse, uncomplicated: Secondary | ICD-10-CM

## 2016-01-29 DIAGNOSIS — Z3A38 38 weeks gestation of pregnancy: Secondary | ICD-10-CM

## 2016-01-29 DIAGNOSIS — O99324 Drug use complicating childbirth: Secondary | ICD-10-CM

## 2016-01-29 LAB — RPR: RPR: NONREACTIVE

## 2016-01-29 LAB — CBC
HCT: 34.8 % — ABNORMAL LOW (ref 36.0–46.0)
Hemoglobin: 11.7 g/dL — ABNORMAL LOW (ref 12.0–15.0)
MCH: 27.6 pg (ref 26.0–34.0)
MCHC: 33.6 g/dL (ref 30.0–36.0)
MCV: 82.1 fL (ref 78.0–100.0)
PLATELETS: 269 10*3/uL (ref 150–400)
RBC: 4.24 MIL/uL (ref 3.87–5.11)
RDW: 16.5 % — ABNORMAL HIGH (ref 11.5–15.5)
WBC: 25.3 10*3/uL — ABNORMAL HIGH (ref 4.0–10.5)

## 2016-01-29 LAB — TYPE AND SCREEN
ABO/RH(D): O POS
ANTIBODY SCREEN: NEGATIVE

## 2016-01-29 LAB — ABO/RH: ABO/RH(D): O POS

## 2016-01-29 MED ORDER — SENNOSIDES-DOCUSATE SODIUM 8.6-50 MG PO TABS
2.0000 | ORAL_TABLET | ORAL | Status: DC
  Administered 2016-01-29 – 2016-01-31 (×2): 2 via ORAL
  Filled 2016-01-29 (×2): qty 2

## 2016-01-29 MED ORDER — PRENATAL MULTIVITAMIN CH
1.0000 | ORAL_TABLET | Freq: Every day | ORAL | Status: DC
  Administered 2016-01-29 – 2016-01-31 (×3): 1 via ORAL
  Filled 2016-01-29 (×3): qty 1

## 2016-01-29 MED ORDER — OXYCODONE-ACETAMINOPHEN 5-325 MG PO TABS: 2.0000 | ORAL_TABLET | ORAL | Status: DC | PRN

## 2016-01-29 MED ORDER — DIPHENHYDRAMINE HCL 25 MG PO CAPS: 25.0000 mg | ORAL_CAPSULE | Freq: Four times a day (QID) | ORAL | Status: DC | PRN

## 2016-01-29 MED ORDER — BENZOCAINE-MENTHOL 20-0.5 % EX AERO
1.0000 "application " | INHALATION_SPRAY | CUTANEOUS | Status: DC | PRN
  Administered 2016-01-31: 1 via TOPICAL
  Filled 2016-01-29: qty 56

## 2016-01-29 MED ORDER — PHENYLEPHRINE 40 MCG/ML (10ML) SYRINGE FOR IV PUSH (FOR BLOOD PRESSURE SUPPORT)
80.0000 ug | PREFILLED_SYRINGE | INTRAVENOUS | Status: DC | PRN
  Administered 2016-01-29: 80 ug via INTRAVENOUS
  Filled 2016-01-29: qty 5

## 2016-01-29 MED ORDER — SIMETHICONE 80 MG PO CHEW: 80.0000 mg | CHEWABLE_TABLET | ORAL | Status: DC | PRN

## 2016-01-29 MED ORDER — EPHEDRINE 5 MG/ML INJ
10.0000 mg | INTRAVENOUS | Status: DC | PRN
  Filled 2016-01-29: qty 2

## 2016-01-29 MED ORDER — PHENYLEPHRINE 40 MCG/ML (10ML) SYRINGE FOR IV PUSH (FOR BLOOD PRESSURE SUPPORT)
80.0000 ug | PREFILLED_SYRINGE | INTRAVENOUS | Status: DC | PRN
Start: 2016-01-29 — End: 2016-01-29
  Administered 2016-01-29: 80 ug via INTRAVENOUS
  Filled 2016-01-29: qty 5
  Filled 2016-01-29: qty 10

## 2016-01-29 MED ORDER — LIDOCAINE HCL (PF) 1 % IJ SOLN
INTRAMUSCULAR | Status: DC | PRN
  Administered 2016-01-29 (×2): 4 mL via EPIDURAL

## 2016-01-29 MED ORDER — DIPHENHYDRAMINE HCL 50 MG/ML IJ SOLN: 12.5000 mg | INTRAMUSCULAR | Status: DC | PRN

## 2016-01-29 MED ORDER — COCONUT OIL OIL: 1.0000 "application " | TOPICAL_OIL | Status: DC | PRN

## 2016-01-29 MED ORDER — LACTATED RINGERS IV SOLN: 500.0000 mL | Freq: Once | INTRAVENOUS | Status: DC

## 2016-01-29 MED ORDER — ACETAMINOPHEN 325 MG PO TABS
650.0000 mg | ORAL_TABLET | ORAL | Status: DC | PRN
Start: 2016-01-29 — End: 2016-01-31

## 2016-01-29 MED ORDER — IBUPROFEN 600 MG PO TABS
600.0000 mg | ORAL_TABLET | Freq: Four times a day (QID) | ORAL | Status: DC
  Administered 2016-01-29 – 2016-01-31 (×10): 600 mg via ORAL
  Filled 2016-01-29 (×10): qty 1

## 2016-01-29 MED ORDER — MISOPROSTOL 200 MCG PO TABS
800.0000 ug | ORAL_TABLET | Freq: Once | ORAL | Status: AC
  Administered 2016-01-29: 800 ug via RECTAL

## 2016-01-29 MED ORDER — ZOLPIDEM TARTRATE 5 MG PO TABS: 5.0000 mg | ORAL_TABLET | Freq: Every evening | ORAL | Status: DC | PRN

## 2016-01-29 MED ORDER — TETANUS-DIPHTH-ACELL PERTUSSIS 5-2.5-18.5 LF-MCG/0.5 IM SUSP: 0.5000 mL | Freq: Once | INTRAMUSCULAR | Status: DC

## 2016-01-29 MED ORDER — DIBUCAINE 1 % RE OINT: 1.0000 "application " | TOPICAL_OINTMENT | RECTAL | Status: DC | PRN

## 2016-01-29 MED ORDER — MISOPROSTOL 200 MCG PO TABS
ORAL_TABLET | ORAL | Status: AC
  Filled 2016-01-29: qty 4

## 2016-01-29 MED ORDER — WITCH HAZEL-GLYCERIN EX PADS: 1.0000 "application " | MEDICATED_PAD | CUTANEOUS | Status: DC | PRN

## 2016-01-29 MED ORDER — FENTANYL 2.5 MCG/ML BUPIVACAINE 1/10 % EPIDURAL INFUSION (WH - ANES)
14.0000 mL/h | INTRAMUSCULAR | Status: DC | PRN
Start: 2016-01-29 — End: 2016-01-29
  Administered 2016-01-29: 14 mL/h via EPIDURAL
  Filled 2016-01-29: qty 125

## 2016-01-29 MED ORDER — OXYCODONE-ACETAMINOPHEN 5-325 MG PO TABS: 1.0000 | ORAL_TABLET | ORAL | Status: DC | PRN

## 2016-01-29 NOTE — Anesthesia Postprocedure Evaluation (Signed)
Anesthesia Post Note  Patient: Abigail [name]  Procedure(s) Performed: * No procedures listed *  Patient location during evaluation: [facility] Anesthesia Type: Epidural Level of consciousness: awake, awake and alert, oriented and patient cooperative Pain management: pain level controlled Vital Signs Assessment: post-procedure vital signs reviewed and stable Respiratory status: spontaneous breathing, nonlabored ventilation and respiratory function stable Cardiovascular status: stable Postop Assessment: no headache, no backache, patient able to bend at knees and no signs of nausea or vomiting Anesthetic complications: no     Last Vitals:  Filed Vitals:   01/29/16 0535 01/29/16 0634  BP: 115/65 121/65  Pulse: 81 89  Temp: 37.1 C 37 C  Resp: 18 18    Last Pain:  Filed Vitals:   01/29/16 0642  PainSc: 0-No pain   Pain Goal:                 [name]

## 2016-01-29 NOTE — Plan of Care (Signed)
Problem: Life Cycle: Goal: Risk for postpartum hemorrhage will decrease Outcome: Progressing Passed 1 lg clot 6/20 @ 1900  Problem: Role Relationship: Goal: Ability to demonstrate positive interaction with newborn will improve Outcome: Progressing SW consult
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
## 2016-01-29 NOTE — Anesthesia Preprocedure Evaluation (Signed)
Anesthesia Evaluation  Patient identified by MRN, date of birth, ID band Patient awake    Reviewed: Allergy & Precautions, NPO status , Patient's Chart, lab work & pertinent test results  Airway Mallampati: II  TM Distance: >3 FB Neck ROM: Full    Dental  (+) Teeth Intact   Pulmonary former smoker,    Pulmonary exam normal breath sounds clear to auscultation       Cardiovascular negative cardio ROS Normal cardiovascular exam Rhythm:Regular Rate:Normal     Neuro/Psych PSYCHIATRIC DISORDERS Anxiety Depression Bipolar Disorder negative neurological ROS     GI/Hepatic negative GI ROS, (+)     substance abuse  alcohol use, cocaine use and marijuana use, Hx/o Heroin abuse   Endo/Other  negative endocrine ROS  Renal/GU negative Renal ROS  negative genitourinary   Musculoskeletal Chronic low back pain   Abdominal   Peds  Hematology  (+) anemia ,   Anesthesia Other Findings   Reproductive/Obstetrics (+) Pregnancy PTL 38 weeks                             Anesthesia Physical Anesthesia Plan  ASA: II  Anesthesia Plan: Epidural   Post-op Pain Management:    Induction:   Airway Management Planned: Natural Airway  Additional Equipment:   Intra-op Plan:   Post-operative Plan:   Informed Consent: I have reviewed the patients History and Physical, chart, labs and discussed the procedure including the risks, benefits and alternatives for the proposed anesthesia with the patient or authorized representative who has indicated his/her understanding and acceptance.   Dental advisory given  Plan Discussed with: Anesthesiologist  Anesthesia Plan Comments:         Anesthesia Quick Evaluation

## 2016-01-29 NOTE — Anesthesia Procedure Notes (Signed)
Epidural Patient location during procedure: OB Start time: 01/29/2016 12:57 AM  Staffing Anesthesiologist: Mal AmabileFOSTER, Casy Tavano Performed by: anesthesiologist   Preanesthetic Checklist Completed: patient identified, site marked, surgical consent, pre-op evaluation, timeout performed, IV checked, risks and benefits discussed and monitors and equipment checked  Epidural Patient position: sitting Prep: site prepped and draped and DuraPrep Patient monitoring: continuous pulse ox and blood pressure Approach: midline Location: L3-L4 Injection technique: LOR air  Needle:  Needle type: Tuohy  Needle gauge: 17 G Needle length: 9 cm and 9 Needle insertion depth: 5 cm cm Catheter type: closed end flexible Catheter size: 19 Gauge Catheter at skin depth: 10 cm Test dose: negative and Other  Assessment Events: blood not aspirated, injection not painful, no injection resistance, negative IV test and no paresthesia  Additional Notes Patient identified. Risks and benefits discussed including failed block, incomplete  Pain control, post dural puncture headache, nerve damage, paralysis, blood pressure Changes, nausea, vomiting, reactions to medications-both toxic and allergic and post Partum back pain. All questions were answered. Patient expressed understanding and wished to proceed. Sterile technique was used throughout procedure. Epidural site was Dressed with sterile barrier dressing. No paresthesias, signs of intravascular injection Or signs of intrathecal spread were encountered.  Patient was more comfortable after the epidural was dosed. Please see RN's note for documentation of vital signs and FHR which are stable.
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
## 2016-01-30 NOTE — Progress Notes (Signed)
Post Partum Day 1 Subjective:  Abigail [name] is a 25 y.o. [id number] 3954w3d s/p SVD at 03:30 01/29/2016.  No acute events overnight. Pt denies problems with ambulating, voiding or po intake.  She denies nausea or vomiting.  Pain is well controlled.  She had a mildly painful bowel movement this morning and notes that she passed a golf-ball sized clot.  Currently, Lochia Small.  Plan for birth control is abstinence. Patient is [facility] for [facility] and states that the baby's father is currently in prison. At her current residence, females are not allowed to speak to males; further, the patient endorses worsening depression with hormonal birth control use. As such, she states her method of birth control was assumed to be abstinence. She was educated on the benefits non-hormonal contraception, namely the copper IUD. Patient acknowledged understanding and stated that she would consider it. Method of Feeding: Breast  Objective: Blood pressure 94/50, pulse 73, temperature 99 F (37.2 C), temperature source Oral, resp. rate 18, height 5\' 4"  (1.626 m), weight 75.297 kg (166 lb), last menstrual period 05/05/2015, SpO2 99 %, unknown if currently breastfeeding.  Physical Exam:  General: alert, cooperative and no distress Lochia:normal flow Chest: normal WOB Heart: Regular rate Abdomen: +BS, soft, mild TTP (appropriate) Uterine Fundus: firm DVT Evaluation: No evidence of DVT seen on physical exam. Extremities: minimal edema   Recent Labs  01/29/16 0005  HGB 11.7*  HCT 34.8*    Assessment/Plan:  ASSESSMENT: Abigail [name] is a 25 y.o. [id number] 7954w3d s/p SVD yesterday morning. Based on her complicated history, including polysubstance abuse, social work has been called to consult on her case and assist in disposition. Patient is currently doing well and would otherwise be ready for discharge.  Social Work consult for substance use and multiple psychiatric issues Continue routine PP  care Breastfeeding support PRN  LOS: 2 days   Mariana SingleJamie Rose 01/30/2016, 8:09 AM

## 2016-01-31 MED ORDER — IBUPROFEN 600 MG PO TABS: 600.0000 mg | ORAL_TABLET | Freq: Four times a day (QID) | ORAL | Status: DC

## 2016-01-31 MED ORDER — ACETAMINOPHEN 325 MG PO TABS: 650.0000 mg | ORAL_TABLET | ORAL | Status: DC | PRN

## 2016-01-31 NOTE — Discharge Instructions (Signed)

## 2016-01-31 NOTE — Discharge Summary (Signed)
OB Discharge Summary     Patient Name: Abigail [name] DOB: 02/28/1991 MRN: [id number]  Date of admission: 01/28/2016 Delivering MD: Olena LeatherwoodAGUILAR, KELLY M   Date of discharge: 01/31/2016  Admitting diagnosis: 38wks 5cm dialated 3mis ctx Intrauterine pregnancy: 3343w3d     Secondary diagnosis:  Principal Problem:   Prolonged latent phase of labor Active Problems:   Substance induced mood disorder (HCC)   Drug abuse   Hepatitis C antibody test positive   SVD (spontaneous vaginal delivery)  Additional problems: none     Discharge diagnosis: Term Pregnancy Delivered                                                                                                Post partum procedures:none  Augmentation: none  Complications: None  Hospital course:  Onset of Labor With Vaginal Delivery     25 y.o. yo [id number] at 5843w3d was admitted in Active Labor on 01/28/2016. Patient had an uncomplicated labor course as follows:  Membrane Rupture Time/Date: 3:15 AM ,01/29/2016   Intrapartum Procedures: Episiotomy: None [1]                                         Lacerations:  None [1]  Patient had a delivery of a Viable infant. 01/29/2016  Information for the patient's newborn:  Aquilla Solianurner, Boy Jasia [[id number]][[id number]]  Delivery Method: Vaginal, Spontaneous Delivery (Filed from Delivery Summary)    Pateint had an uncomplicated postpartum course.  She is ambulating, tolerating a regular diet, passing flatus, and urinating well. Patient is discharged home in stable condition on 01/31/2016.  History of depression/Anxiety: stable. On Zoloft during pregnancy. Discharged home on zoloft.   History of polysubstance use: patient at rehab. Sober for about 90 days based on her UDS. Evaluated by SW. CPS to evaluate about baby.   Physical exam  Filed Vitals:   01/29/16 1400 01/30/16 0452 01/30/16 1800 01/31/16 0634  BP: 111/54 94/50 118/65 109/64  Pulse: 76 73 76 72  Temp: 98.7 F (37.1 C) 99 F (37.2 C) 98.3 F  (36.8 C) 98.2 F (36.8 C)  TempSrc: Oral Oral Oral Oral  Resp: 18 18 18 18   Height:      Weight:      SpO2: 99%      General: alert, cooperative and no distress Lochia: appropriate Uterine Fundus: firm Incision: N/A DVT Evaluation: No evidence of DVT seen on physical exam. Labs: Lab Results  Component Value Date   WBC 25.3* 01/29/2016   HGB 11.7* 01/29/2016   HCT 34.8* 01/29/2016   MCV 82.1 01/29/2016   PLT 269 01/29/2016   CMP Latest Ref Rng 09/04/2015  Glucose 65 - 99 mg/dL 96  BUN 6 - 20 mg/dL <1(H<5(L)  Creatinine 0.860.44 - 1.00 mg/dL 5.780.49  Sodium 469135 - 629145 mmol/L 136  Potassium 3.5 - 5.1 mmol/L 4.0  Chloride 101 - 111 mmol/L 105  CO2 22 - 32 mmol/L 24  Calcium 8.9 - 10.3 mg/dL 5.2(W8.5(L)  Total Protein 6.5 -  8.1 g/dL -  Total Bilirubin 0.3 - 1.2 mg/dL -  Alkaline Phos 38 - 161126 U/L -  AST 15 - 41 U/L -  ALT 14 - 54 U/L -    Discharge instruction: per After Visit Summary and "Baby and Me Booklet".  After visit meds:    Medication List    TAKE these medications        acetaminophen 325 MG tablet  Commonly known as:  TYLENOL  Take 2 tablets (650 mg total) by mouth every 4 (four) hours as needed (for pain scale < 4).     ibuprofen 600 MG tablet  Commonly known as:  ADVIL,MOTRIN  Take 1 tablet (600 mg total) by mouth every 6 (six) hours.     PNV PRENATAL PLUS MULTIVITAMIN 27-1 MG Tabs  Take 1 tablet by mouth daily.     sertraline 100 MG tablet  Commonly known as:  ZOLOFT  Take 100 mg by mouth daily.        Diet: routine diet  Activity: Advance as tolerated. Pelvic rest for 6 weeks.   Outpatient follow up:2 weeks Follow-up Information    Follow [facility] with [facility] In 2 weeks.   Specialty:  Obstetrics and Gynecology   Why:  Postpartum check   Contact information:   [address] [place] [id number] [phone]      Postpartum contraception: Abstinence (counseled on all options and preferred abstinence)  Newborn  Data: Live born female  Birth Weight: 7 lb 3 oz (3260 g) APGAR: 6, 8  Baby Feeding: Bottle and Breast Disposition:awaiting CPS evaluation due maternal hx of substance use   01/31/2016 Almon Herculesaye T Gonfa, MD   OB fellow attestation I have seen and examined this patient and agree with above documentation in the resident's note.   Abigail [name] is a 25 y.o. N8G9562G3P2012 s/p SVD.   Pain is well controlled.  Plan for birth control is no method.  Method of Feeding: both  PE:  BP 109/64 mmHg  Pulse 72  Temp(Src) 98.2 F (36.8 C) (Oral)  Resp 18  Ht 5\' 4"  (1.626 m)  Wt 75.297 kg (166 lb)  BMI 28.48 kg/m2  SpO2 99%  LMP 05/05/2015 (Exact Date)  Breastfeeding? Unknown Fundus firm  No results for input(s): HGB, HCT in the last 72 hours.   Plan: discharge today - postpartum care discussed - f/u clinic in 6 weeks for postpartum visit - SW/CPS involved in d/c planning   Zhania Shaheen, CNM 4:22 PM

## 2016-01-31 NOTE — Progress Notes (Signed)
LCSW following case for CSW Colleen at this time. CPS with [place] has assessed baby and MOB. She reports she is going out to the home and then will staff with supervisor regarding discharge plan for baby to be either DC with MOB or other recommendations.  CPS is aware of DC summary for MOB.  Working actively on plan and will call back after case has been staffed. Will follow up.  Delsa Walder LCSW, MSW Clinical Social Work: [facility] [phone] 

## 2016-02-04 ENCOUNTER — Ambulatory Visit (HOSPITAL_COMMUNITY): Payer: No Typology Code available for payment source

## 2016-02-04 ENCOUNTER — Encounter: Payer: Self-pay | Admitting: Family Medicine

## 2016-03-26 ENCOUNTER — Ambulatory Visit: Payer: Self-pay | Admitting: Medical

## 2016-09-29 ENCOUNTER — Ambulatory Visit: Payer: Medicaid Other | Admitting: Adult Health

## 2016-10-02 ENCOUNTER — Ambulatory Visit (INDEPENDENT_AMBULATORY_CARE_PROVIDER_SITE_OTHER): Payer: Medicaid Other | Admitting: Adult Health

## 2016-10-02 ENCOUNTER — Encounter (INDEPENDENT_AMBULATORY_CARE_PROVIDER_SITE_OTHER): Payer: Self-pay

## 2016-10-02 ENCOUNTER — Encounter: Payer: Self-pay | Admitting: Adult Health

## 2016-10-02 VITALS — BP 130/60 | HR 76 | Ht 63.0 in | Wt 166.0 lb

## 2016-10-02 DIAGNOSIS — R51 Headache: Secondary | ICD-10-CM

## 2016-10-02 DIAGNOSIS — Z3201 Encounter for pregnancy test, result positive: Secondary | ICD-10-CM

## 2016-10-02 DIAGNOSIS — F1911 Other psychoactive substance abuse, in remission: Secondary | ICD-10-CM | POA: Insufficient documentation

## 2016-10-02 DIAGNOSIS — R110 Nausea: Secondary | ICD-10-CM | POA: Diagnosis not present

## 2016-10-02 DIAGNOSIS — N926 Irregular menstruation, unspecified: Secondary | ICD-10-CM | POA: Diagnosis not present

## 2016-10-02 DIAGNOSIS — O3680X0 Pregnancy with inconclusive fetal viability, not applicable or unspecified: Secondary | ICD-10-CM

## 2016-10-02 DIAGNOSIS — Z3490 Encounter for supervision of normal pregnancy, unspecified, unspecified trimester: Secondary | ICD-10-CM

## 2016-10-02 LAB — POCT URINE PREGNANCY: PREG TEST UR: POSITIVE — AB

## 2016-10-02 MED ORDER — PROMETHAZINE HCL 25 MG PO TABS: 25.0000 mg | ORAL_TABLET | Freq: Four times a day (QID) | ORAL | 1 refills | Status: DC | PRN

## 2016-10-02 MED ORDER — PRENATAL PLUS/IRON 27-1 MG PO TABS: ORAL_TABLET | ORAL | 12 refills | Status: DC

## 2016-10-02 NOTE — Progress Notes (Signed)
Subjective:     Patient ID: Abigail [name], female   DOB: 03/20/1991, 26 y.o.   MRN: [id number]  HPI Abigail [name] is a 26 year old white female in for UPT, has missed a period and has nausea and headaches at times.  Review of Systems +missed period +nausea and headaches  Reviewed past medical,surgical, social and family history. Reviewed medications and allergies.     Objective:   Physical Exam BP 130/60 (BP Location: Left Arm, Patient Position: Sitting, Cuff Size: Normal)   Pulse 76   Ht 5\' 3"  (1.6 m)   Wt 166 lb (75.3 kg)   LMP 08/15/2016 (Approximate)   Breastfeeding? No   BMI 29.41 kg/m UPT +, about 6+6 weeks by LMP with EDD 05/22/17, Skin warm and dry. Neck: mid line trachea, normal thyroid, good ROM, no lymphadenopathy noted. Lungs: clear to ausculation bilaterally. Cardiovascular: regular rate and rhythm.Abdomen is soft and non tender, she is not sure if wants to continue this pregnancy, will get US to get dating.    Assessment:     1. Pregnancy examination or test, positive result   2. Pregnancy, unspecified gestational age   53. Encounter to determine fetal viability of pregnancy, single or unspecified fetus   4. History of drug abuse       Plan:     Meds ordered this encounter  Medications  . promethazine (PHENERGAN) 25 MG tablet    Sig: Take 1 tablet (25 mg total) by mouth every 6 (six) hours as needed for nausea or vomiting.    Dispense:  30 tablet    Refill:  1    Order Specific Question:   Supervising Provider    Answer:   Despina HiddenEURE, LUTHER H [[id number]]  . Prenatal Vit-Fe Fumarate-FA (PRENATAL PLUS/IRON) 27-1 MG TABS    Sig: Take 1 daily    Dispense:  30 each    Refill:  12    Order Specific Question:   Supervising Provider    Answer:   Duane LopeEURE, LUTHER H [[id number]]  Return in 1 week for dating [place]

## 2016-10-09 ENCOUNTER — Ambulatory Visit (INDEPENDENT_AMBULATORY_CARE_PROVIDER_SITE_OTHER): Payer: Medicaid Other

## 2016-10-09 DIAGNOSIS — O3680X0 Pregnancy with inconclusive fetal viability, not applicable or unspecified: Secondary | ICD-10-CM

## 2016-10-09 DIAGNOSIS — Z3A08 8 weeks gestation of pregnancy: Secondary | ICD-10-CM

## 2016-10-09 NOTE — Progress Notes (Signed)
US 7+6 wks,single IUP w/ys,pos fht 121 bpm,normal ov's bilat,crl 11.8 mm

## 2016-10-21 ENCOUNTER — Ambulatory Visit: Payer: Medicaid Other | Admitting: *Deleted

## 2016-10-21 ENCOUNTER — Encounter: Payer: Medicaid Other | Admitting: Advanced Practice Midwife

## 2016-10-27 ENCOUNTER — Other Ambulatory Visit: Payer: Self-pay | Admitting: Obstetrics and Gynecology

## 2016-10-30 ENCOUNTER — Encounter: Payer: Medicaid Other | Admitting: Adult Health

## 2016-10-30 ENCOUNTER — Ambulatory Visit: Payer: Medicaid Other | Admitting: *Deleted

## 2017-04-02 ENCOUNTER — Telehealth: Payer: Self-pay | Admitting: *Deleted

## 2017-04-03 ENCOUNTER — Encounter: Payer: Self-pay | Admitting: *Deleted

## 2017-04-03 NOTE — Telephone Encounter (Signed)
Patient states she had an abortion on 11/01/16.Went to [facility] and informed them she had been to rehab but now they have a form that needs to be filled out in order for her to drive. Informed patient to drop form off on Monday. Verbalized understanding.

## 2017-04-21 ENCOUNTER — Encounter: Payer: Self-pay | Admitting: Family Medicine

## 2017-04-21 ENCOUNTER — Ambulatory Visit (INDEPENDENT_AMBULATORY_CARE_PROVIDER_SITE_OTHER): Payer: Medicaid Other | Admitting: Family Medicine

## 2017-04-21 VITALS — BP 110/68 | HR 72 | Temp 99.9°F | Ht 62.0 in | Wt 164.0 lb

## 2017-04-21 DIAGNOSIS — Z0000 Encounter for general adult medical examination without abnormal findings: Secondary | ICD-10-CM

## 2017-04-21 DIAGNOSIS — Z87898 Personal history of other specified conditions: Secondary | ICD-10-CM

## 2017-04-21 DIAGNOSIS — R5383 Other fatigue: Secondary | ICD-10-CM

## 2017-04-21 DIAGNOSIS — Z113 Encounter for screening for infections with a predominantly sexual mode of transmission: Secondary | ICD-10-CM | POA: Diagnosis not present

## 2017-04-21 DIAGNOSIS — F1911 Other psychoactive substance abuse, in remission: Secondary | ICD-10-CM
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
## 2017-04-21 NOTE — Progress Notes (Signed)
Patient ID: Abigail [name], female    DOB: 11/25/1990, 26 y.o.   MRN: [id number]  Chief Complaint  Patient presents with  . Addiction Problem    Establish care    Allergies Latex and Penicillins  Subjective:   Abigail [name] is a 26 y.o. female who presents to [facility] today.  HPI HPI  Past Medical History:  Diagnosis Date  . Anemia   . Anxiety   . Bipolar 1 disorder (HCC)   . BV (bacterial vaginosis) 07/17/2015  . Chronic back pain   . Depression   . Drug abuse 07/17/2015  . Fracture, ribs leftside  . Liver laceration   . Pneumothorax left  . Polysubstance abuse    opiods, cocaine, marijuana, heroin  . PTSD (post-traumatic stress disorder)   . PTSD (post-traumatic stress disorder)   . Substance induced mood disorder [facility])     Past Surgical History:  Procedure Laterality Date  . ADENOIDECTOMY    . BLADDER SURGERY    . ORIF ANKLE FRACTURE Right 09/01/2015   Procedure: OPEN REDUCTION INTERNAL FIXATION (ORIF)  OPEN FOOT FRACTURES DISLOCATION;  Surgeon: Myrene Galas, MD;  Location: [facility] OR;  Service: Orthopedics;  Laterality: Right;  . TONSILLECTOMY      Family History  Problem Relation Age of Onset  . Anxiety disorder Mother   . Miscarriages / [place] Mother   . Hypertension Father   . Heart disease Father   . Depression Father   . Bipolar disorder Father   . Thyroid disease Father   . Anxiety disorder Father   . Early death Father   . Depression Brother   . Heart disease Brother   . Hypertension Brother   . Diabetes Brother        borderline  . Anxiety disorder Brother   . Bipolar disorder Brother   . Thyroid disease Brother   . Diabetes Maternal Grandmother   . Heart disease Maternal Grandmother   . Diabetes Maternal Grandfather   . Heart disease Maternal Grandfather      Social History   Social History  . Marital status: Single    Spouse name: N/A  . Number of children: N/A  . Years of education: N/A    Occupational History  . Student     Business Admin  . [employer]    Social History Main Topics  . Smoking status: Current Every Day Smoker    Packs/day: 0.50    Years: 9.00    Types: Cigarettes    Last attempt to quit: 09/01/2015  . Smokeless tobacco: Never Used  . Alcohol use No  . Drug use: No     Comment: History dilaudid/heroine  . Sexual activity: Not Currently    Partners: Male    Birth control/ protection: None   Other Topics Concern  . None   Social History Narrative  . None    Outpatient Medications Prior to Visit  Medication Sig Dispense Refill  . acetaminophen (TYLENOL) 325 MG tablet Take 2 tablets (650 mg total) by mouth every 4 (four) hours as needed (for pain scale < 4). (Patient not taking: Reported on 04/21/2017) 30 tablet 0  . promethazine (PHENERGAN) 25 MG tablet Take 1 tablet (25 mg total) by mouth every 6 (six) hours as needed for nausea or vomiting. (Patient not taking: Reported on 04/21/2017) 30 tablet 1  . Prenatal Vit-Fe Fumarate-FA (PRENATAL PLUS/IRON) 27-1 MG TABS Take 1 daily 30 each 12   No facility-administered medications  prior to visit.     Review of Systems   Objective:   BP 110/68   Pulse 72   Temp 99.9 F (37.7 C) (Temporal)   Ht 5\' 2"  (1.575 m)   Wt 164 lb (74.4 kg)   LMP 03/27/2017   SpO2 96%   Breastfeeding? No   BMI 30.00 kg/m   Physical Exam   IAssessment and Plan   There are no diagnoses linked to this encounter.       Judd GaudierShannon M Alton Tremblay, CMA 04/21/2017

## 2017-04-23 NOTE — Progress Notes (Deleted)
    Patient ID: Abigail [name], female    DOB: 05/15/1991, 26 y.o.   MRN: [id number]  No chief complaint on file.   Allergies Latex and Penicillins  Subjective:   Abigail [name] is a 26 y.o. female who [facility].  HPI HPI  Past Medical History:  Diagnosis Date  . Anemia   . Anxiety   . Bipolar 1 disorder (HCC)   . BV (bacterial vaginosis) 07/17/2015  . Chronic back pain   . Depression   . Drug abuse 07/17/2015  . Fracture, ribs leftside  . Liver laceration   . Pneumothorax left  . Polysubstance abuse    opiods, cocaine, marijuana, heroin  . PTSD (post-traumatic stress disorder)   . PTSD (post-traumatic stress disorder)   . Substance induced mood disorder [facility](HCC)     Past Surgical History:  Procedure Laterality Date  . ADENOIDECTOMY    . BLADDER SURGERY     urethral dilatation and bladder dilitation  . ORIF ANKLE FRACTURE Right 09/01/2015   Procedure: OPEN REDUCTION INTERNAL FIXATION (ORIF)  OPEN FOOT FRACTURES DISLOCATION;  Surgeon: Myrene GalasMichael Handy, MD;  Location: [facility] OR;  Service: Orthopedics;  Laterality: Right;  . TONSILLECTOMY      Family History  Problem Relation Age of Onset  . Anxiety disorder Mother   . Miscarriages / [place] Mother   . Hypertension Father   . Heart disease Father   . Depression Father   . Bipolar disorder Father   . Thyroid disease Father   . Anxiety disorder Father   . Early death Father   . Depression Brother   . Heart disease Brother   . Hypertension Brother   . Diabetes Brother        borderline  . Anxiety disorder Brother   . Bipolar disorder Brother   . Thyroid disease Brother   . Diabetes Maternal Grandmother   . Heart disease Maternal Grandmother   . Diabetes Maternal Grandfather   . Heart disease Maternal Grandfather      Social History   Social History  . Marital status: Single    Spouse name: N/A  . Number of children: N/A  . Years of education: N/A   Occupational  History  . Student     Business Admin  . [employer]    Social History Main Topics  . Smoking status: Current Every Day Smoker    Packs/day: 0.50    Years: 9.00    Types: Cigarettes    Last attempt to quit: 09/01/2015  . Smokeless tobacco: Never Used  . Alcohol use No  . Drug use: No     Comment: History dilaudid/heroine  . Sexual activity: Not Currently    Partners: Male    Birth control/ protection: None   Other Topics Concern  . Not on file   Social History Narrative   [place] 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 Not married. Has two children. Has custody of one child, other child with brother. Has one year old and 25 year old. Has a babysitter that takes care of child. Works at [employer]. Sleeping well. No drug use in over 18 months.     Review of Systems   Objective:   LMP 03/27/2017   Physical Exam   Assessment and Plan   There are no diagnoses linked to this encounter.   No Follow-up on file. Judd GaudierShannon M Levens, CMA 04/23/2017

## 2017-04-24 ENCOUNTER — Ambulatory Visit: Payer: Self-pay | Admitting: Family Medicine

## 2017-04-24 LAB — HCV RNA,QUANTITATIVE REAL TIME PCR
HCV Quantitative Log: <1.18 Log IU/mL
HCV RNA, PCR, QN: <15 IU/mL

## 2017-04-24 LAB — CBC WITH DIFFERENTIAL/PLATELET
BASOS ABS: 61 {cells}/uL (ref 0–200)
Basophils Relative: 0.5 %
EOS PCT: 4.1 %
Eosinophils Absolute: 496 cells/uL (ref 15–500)
HEMATOCRIT: 36.9 % (ref 35.0–45.0)
Hemoglobin: 12.5 g/dL (ref 11.7–15.5)
LYMPHS ABS: 4429 {cells}/uL — AB (ref 850–3900)
MCH: 28.6 pg (ref 27.0–33.0)
MCHC: 33.9 g/dL (ref 32.0–36.0)
MCV: 84.4 fL (ref 80.0–100.0)
MONOS PCT: 3.4 %
MPV: 12.5 fL (ref 7.5–12.5)
Neutro Abs: 6703 cells/uL (ref 1500–7800)
Neutrophils Relative %: 55.4 %
Platelets: 185 10*3/uL (ref 140–400)
RBC: 4.37 10*6/uL (ref 3.80–5.10)
RDW: 13.3 % (ref 11.0–15.0)
Total Lymphocyte: 36.6 %
WBC mixed population: 411 cells/uL (ref 200–950)
WBC: 12.1 10*3/uL — AB (ref 3.8–10.8)

## 2017-04-24 LAB — HIV ANTIBODY (ROUTINE TESTING W REFLEX): HIV: NONREACTIVE

## 2017-04-24 LAB — HEPATITIS PANEL, ACUTE
HEP A IGM: NONREACTIVE
Hep B C IgM: NONREACTIVE
Hepatitis B Surface Ag: NONREACTIVE
Hepatitis C Ab: REACTIVE — AB
SIGNAL TO CUT-OFF: 18.7 — AB (ref <1.00)

## 2017-04-24 LAB — HEPATIC FUNCTION PANEL
AG Ratio: 1.4 (calc) (ref 1.0–2.5)
ALBUMIN MSPROF: 4.1 g/dL (ref 3.6–5.1)
ALKALINE PHOSPHATASE (APISO): 87 U/L (ref 33–115)
ALT: 21 U/L (ref 6–29)
AST: 20 U/L (ref 10–30)
Bilirubin, Direct: 0 mg/dL (ref 0.0–0.2)
Globulin: 2.9 g/dL (calc) (ref 1.9–3.7)
Indirect Bilirubin: 0.2 mg/dL (calc) (ref 0.2–1.2)
TOTAL PROTEIN: 7 g/dL (ref 6.1–8.1)
Total Bilirubin: 0.2 mg/dL (ref 0.2–1.2)

## 2017-04-24 LAB — HEMOGLOBIN A1C
Hgb A1c MFr Bld: 5.3 % of total Hgb (ref <5.7)
MEAN PLASMA GLUCOSE: 105 (calc)
eAG (mmol/L): 5.8 (calc)

## 2017-04-24 LAB — BASIC METABOLIC PANEL
BUN: 12 mg/dL (ref 7–25)
CALCIUM: 9.4 mg/dL (ref 8.6–10.2)
CO2: 28 mmol/L (ref 20–32)
Chloride: 104 mmol/L (ref 98–110)
Creat: 0.63 mg/dL (ref 0.50–1.10)
Glucose, Bld: 103 mg/dL (ref 65–139)
Potassium: 4 mmol/L (ref 3.5–5.3)
SODIUM: 138 mmol/L (ref 135–146)

## 2017-04-24 LAB — TSH: TSH: 2.32 m[IU]/L

## 2017-04-24 LAB — RPR: RPR Ser Ql: NONREACTIVE

## 2017-08-27 ENCOUNTER — Emergency Department (HOSPITAL_COMMUNITY): Payer: Medicaid Other

## 2017-08-27 ENCOUNTER — Encounter (HOSPITAL_COMMUNITY): Payer: Self-pay | Admitting: Emergency Medicine

## 2017-08-27 ENCOUNTER — Other Ambulatory Visit: Payer: Self-pay

## 2017-08-27 ENCOUNTER — Emergency Department (HOSPITAL_COMMUNITY)
Admission: EM | Admit: 2017-08-27 | Discharge: 2017-08-27 | Disposition: A | Discharge status: Home / Self Care | Payer: Medicaid Other | Attending: Emergency Medicine | Admitting: Emergency Medicine

## 2017-08-27 DIAGNOSIS — R102 Pelvic and perineal pain: Secondary | ICD-10-CM

## 2017-08-27 DIAGNOSIS — Z79899 Other long term (current) drug therapy: Secondary | ICD-10-CM | POA: Diagnosis not present

## 2017-08-27 DIAGNOSIS — M545 Low back pain: Secondary | ICD-10-CM | POA: Diagnosis not present

## 2017-08-27 DIAGNOSIS — N898 Other specified noninflammatory disorders of vagina: Secondary | ICD-10-CM | POA: Diagnosis not present

## 2017-08-27 DIAGNOSIS — K5900 Constipation, unspecified: Secondary | ICD-10-CM | POA: Diagnosis not present

## 2017-08-27 DIAGNOSIS — F17210 Nicotine dependence, cigarettes, uncomplicated: Secondary | ICD-10-CM | POA: Insufficient documentation

## 2017-08-27 LAB — URINALYSIS, ROUTINE W REFLEX MICROSCOPIC
BACTERIA UA: NONE SEEN
Bilirubin Urine: NEGATIVE
Glucose, UA: NEGATIVE mg/dL
HGB URINE DIPSTICK: NEGATIVE
Ketones, ur: NEGATIVE mg/dL
Nitrite: NEGATIVE
PROTEIN: NEGATIVE mg/dL
Specific Gravity, Urine: 1.021 (ref 1.005–1.030)
pH: 7 (ref 5.0–8.0)

## 2017-08-27 LAB — WET PREP, GENITAL
CLUE CELLS WET PREP: NONE SEEN
Sperm: NONE SEEN
TRICH WET PREP: NONE SEEN
YEAST WET PREP: NONE SEEN

## 2017-08-27 LAB — PREGNANCY, URINE: PREG TEST UR: NEGATIVE

## 2017-08-27 MED ORDER — NAPROXEN 250 MG PO TABS: 250.0000 mg | ORAL_TABLET | Freq: Two times a day (BID) | ORAL | 0 refills | Status: DC | PRN

## 2017-08-27 MED ORDER — ACETAMINOPHEN 325 MG PO TABS
650.0000 mg | ORAL_TABLET | Freq: Once | ORAL | Status: AC
  Administered 2017-08-27: 650 mg via ORAL
  Filled 2017-08-27: qty 2

## 2017-08-27 MED ORDER — CEFTRIAXONE SODIUM 250 MG IJ SOLR
250.0000 mg | Freq: Once | INTRAMUSCULAR | Status: AC
  Administered 2017-08-27: 250 mg via INTRAMUSCULAR
  Filled 2017-08-27: qty 250

## 2017-08-27 MED ORDER — AZITHROMYCIN 250 MG PO TABS
1000.0000 mg | ORAL_TABLET | Freq: Once | ORAL | Status: AC
  Administered 2017-08-27: 1000 mg via ORAL
  Filled 2017-08-27: qty 4

## 2017-08-27 MED ORDER — IBUPROFEN 400 MG PO TABS
400.0000 mg | ORAL_TABLET | Freq: Once | ORAL | Status: AC
  Administered 2017-08-27: 400 mg via ORAL
  Filled 2017-08-27: qty 1

## 2017-08-27 MED ORDER — LIDOCAINE HCL (PF) 1 % IJ SOLN
INTRAMUSCULAR | Status: AC
  Administered 2017-08-27: 2 mL
  Filled 2017-08-27: qty 2

## 2017-08-27 MED ORDER — OXYCODONE-ACETAMINOPHEN 5-325 MG PO TABS: 1.0000 | ORAL_TABLET | Freq: Once | ORAL | Status: DC

## 2017-08-27 NOTE — ED Triage Notes (Signed)
Pt c/o lower abd pain and lower back pain since new years.
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
## 2017-08-27 NOTE — ED Notes (Signed)
Patient transported to CT 

## 2017-08-27 NOTE — Discharge Instructions (Signed)
Take over the counter laxative (such as miralax, milk of magnesia, senokot) AND a dulcolax suppository today and repeat both tomorrow.  Begin to take over the counter stool softener (colace), as directed on packaging, for the next month.  Take the prescription as directed.  Your gonorrhea and chlamydia culture is pending results, and you will receive a phone call in the next several days if it is positive.  However, you were treated empirically today with antibiotics for both gonorrhea and chlamydia.  Call your regular OB/GYN doctor tomorrow to schedule a follow up appointment within the next week.  Return to the Emergency Department immediately if worsening.

## 2017-08-31 LAB — GC/CHLAMYDIA PROBE AMP (~~LOC~~) NOT AT ARMC
Chlamydia: NEGATIVE
NEISSERIA GONORRHEA: NEGATIVE

## 2017-11-18 IMAGING — CR DG CHEST 1V PORT
1 series · 1 of 1 positions shown · non-contrast
Comparison: CT of the chest and chest radiograph performed earlier
today at [DATE] a.m. and [DATE] p.m.

CLINICAL DATA: Follow-up left-sided pneumothorax. Initial
encounter.

EXAM:
PORTABLE CHEST 1 VIEW

[AP]
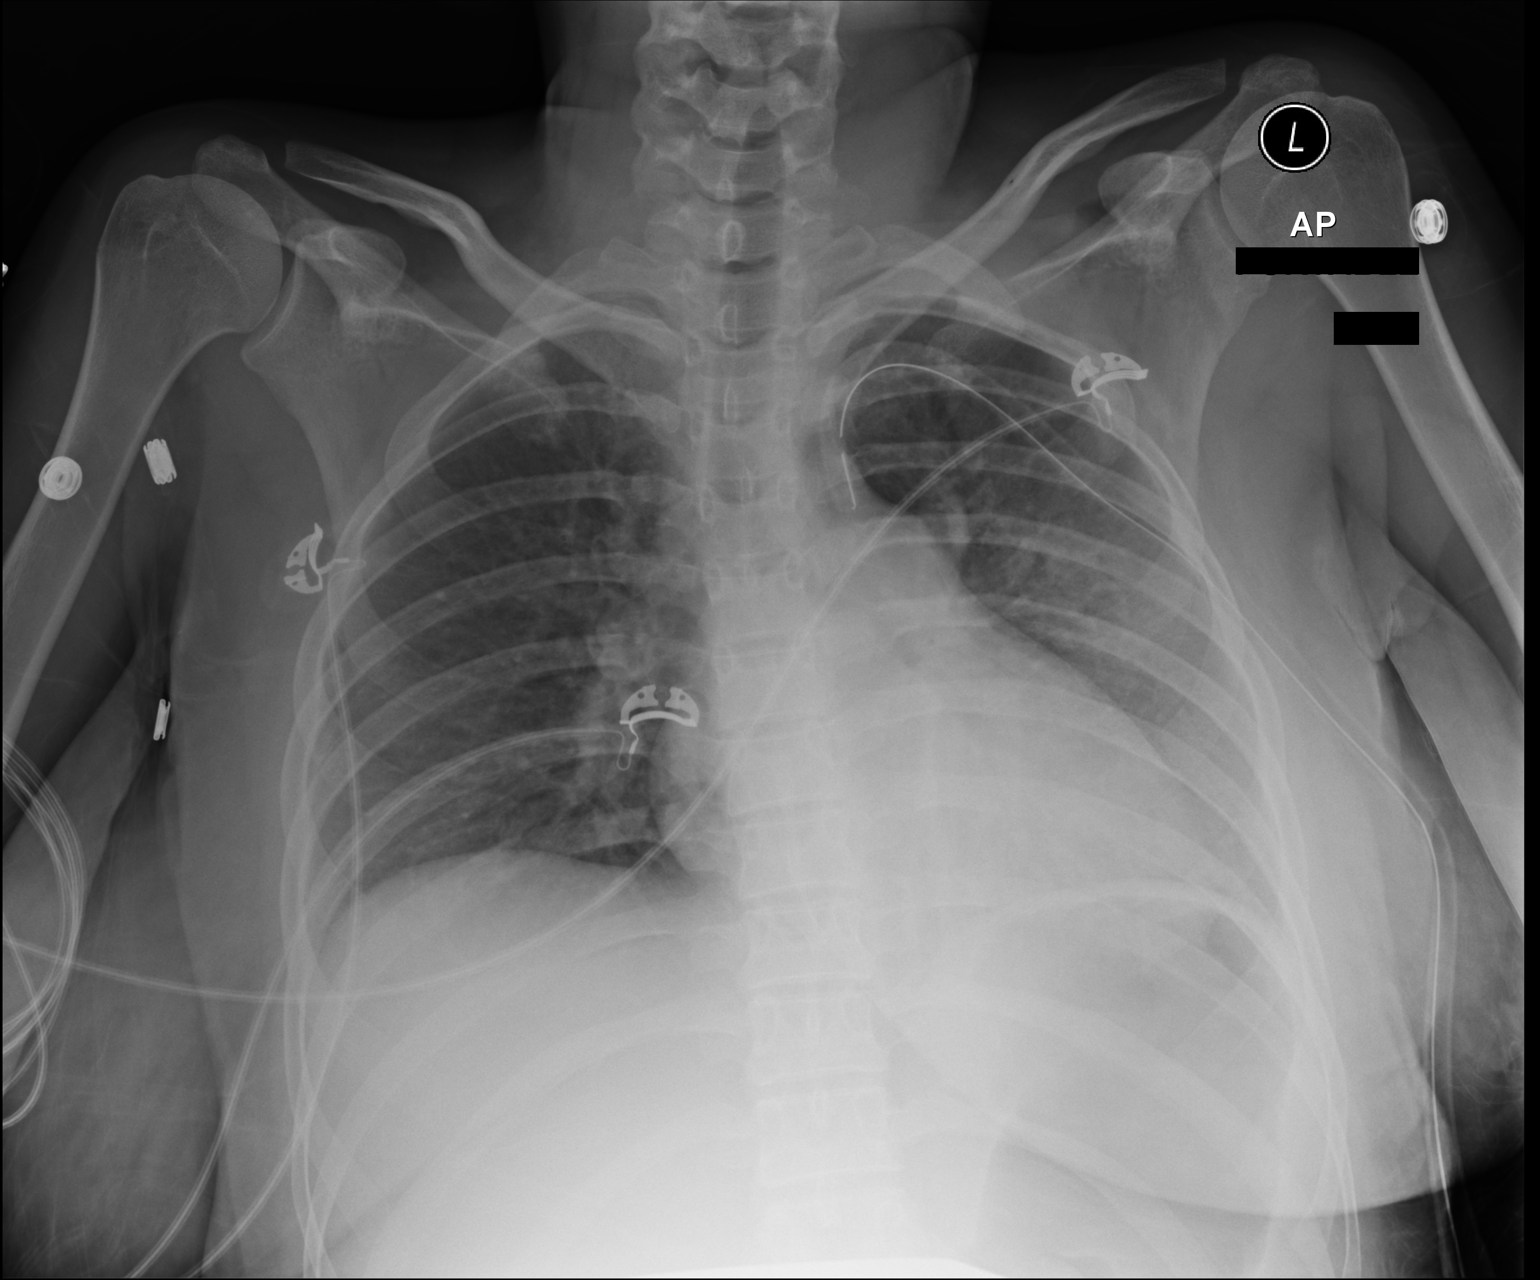

[1 of 1 positions shown; findings below may reference images not displayed]

FINDINGS: A left-sided chest tube is again noted, ending near the left lung
apex. No significant residual pneumothorax is seen at this time.

The lungs are hypoexpanded. Mild patchy left-sided airspace opacity
likely reflects mild pulmonary parenchymal contusion. No definite
pleural effusion or new is seen.

The cardiomediastinal silhouette is borderline normal in size. No
new osseous abnormalities are identified.
IMPRESSION: 1. Left-sided chest tube again noted, ending near the left lung
apex. No significant residual pneumothorax seen at this time.
2. Lungs hypoexpanded. Mild patchy left-sided airspace opacity
likely reflects mild pulmonary parenchymal contusion.
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
## 2017-11-19 IMAGING — CR DG CHEST 1V PORT
1 series · 1 of 1 positions shown · non-contrast
Comparison: 08/31/2005

CLINICAL DATA: Left pneumothorax.

EXAM:
PORTABLE CHEST 1 VIEW

[AP]
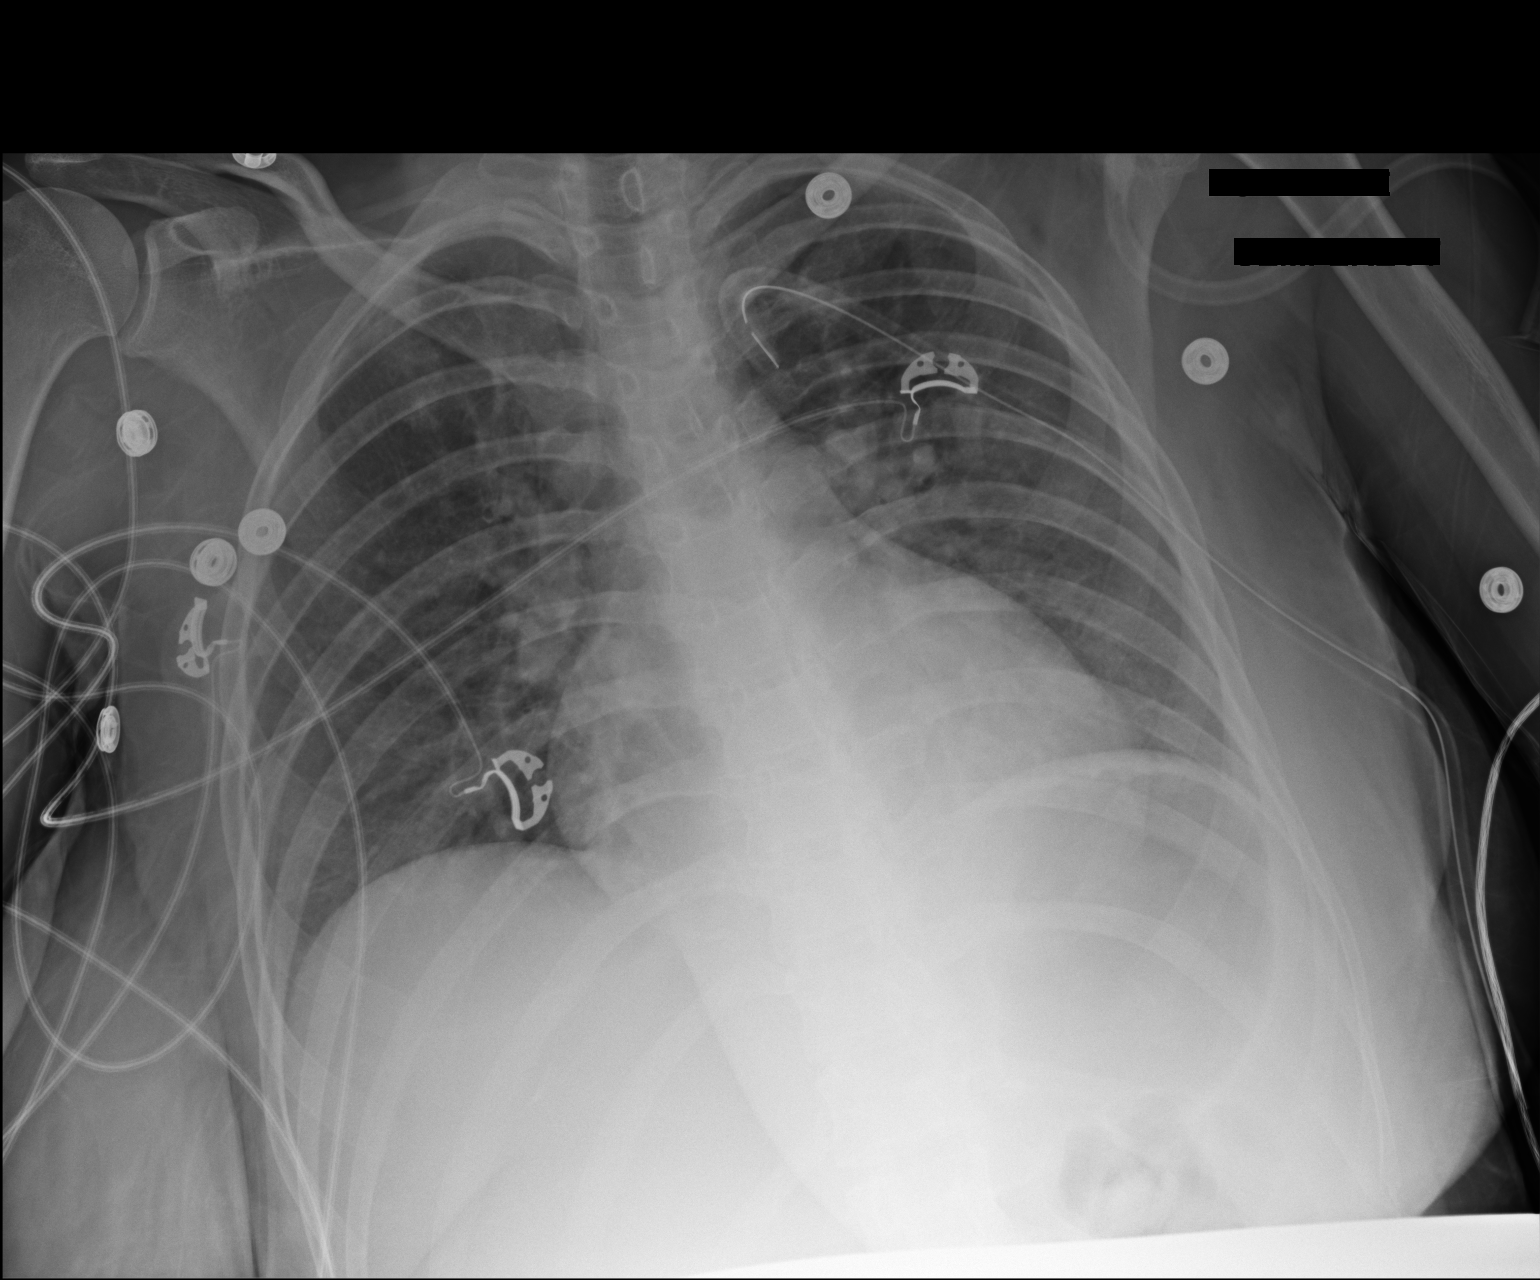

[1 of 1 positions shown; findings below may reference images not displayed]

FINDINGS: Cardiomediastinal silhouette is normal. Mediastinal contours appear
intact.

Left sided chest tube in place, stable. No significant residual
pneumothorax. Mild haziness of the left lower lobe may represent
atelectasis versus pulmonary contusion.

Osseous structures are without acute abnormality. Soft tissues are
grossly normal.
IMPRESSION: Stable positioning of the left chest tube without evidence of
appreciable residual pneumothorax.

Mild haziness of the left lower lobe may represent atelectasis
versus evolving pulmonary contusion.

## 2017-11-22 IMAGING — DX DG CHEST 1V PORT
1 series · 1 of 1 positions shown · non-contrast
Comparison: Portable chest x-ray September 04, 2015

CLINICAL DATA: Traumatic hemo thorax, 16 weeks pregnant.

EXAM:
PORTABLE CHEST 1 VIEW

[chest ap]
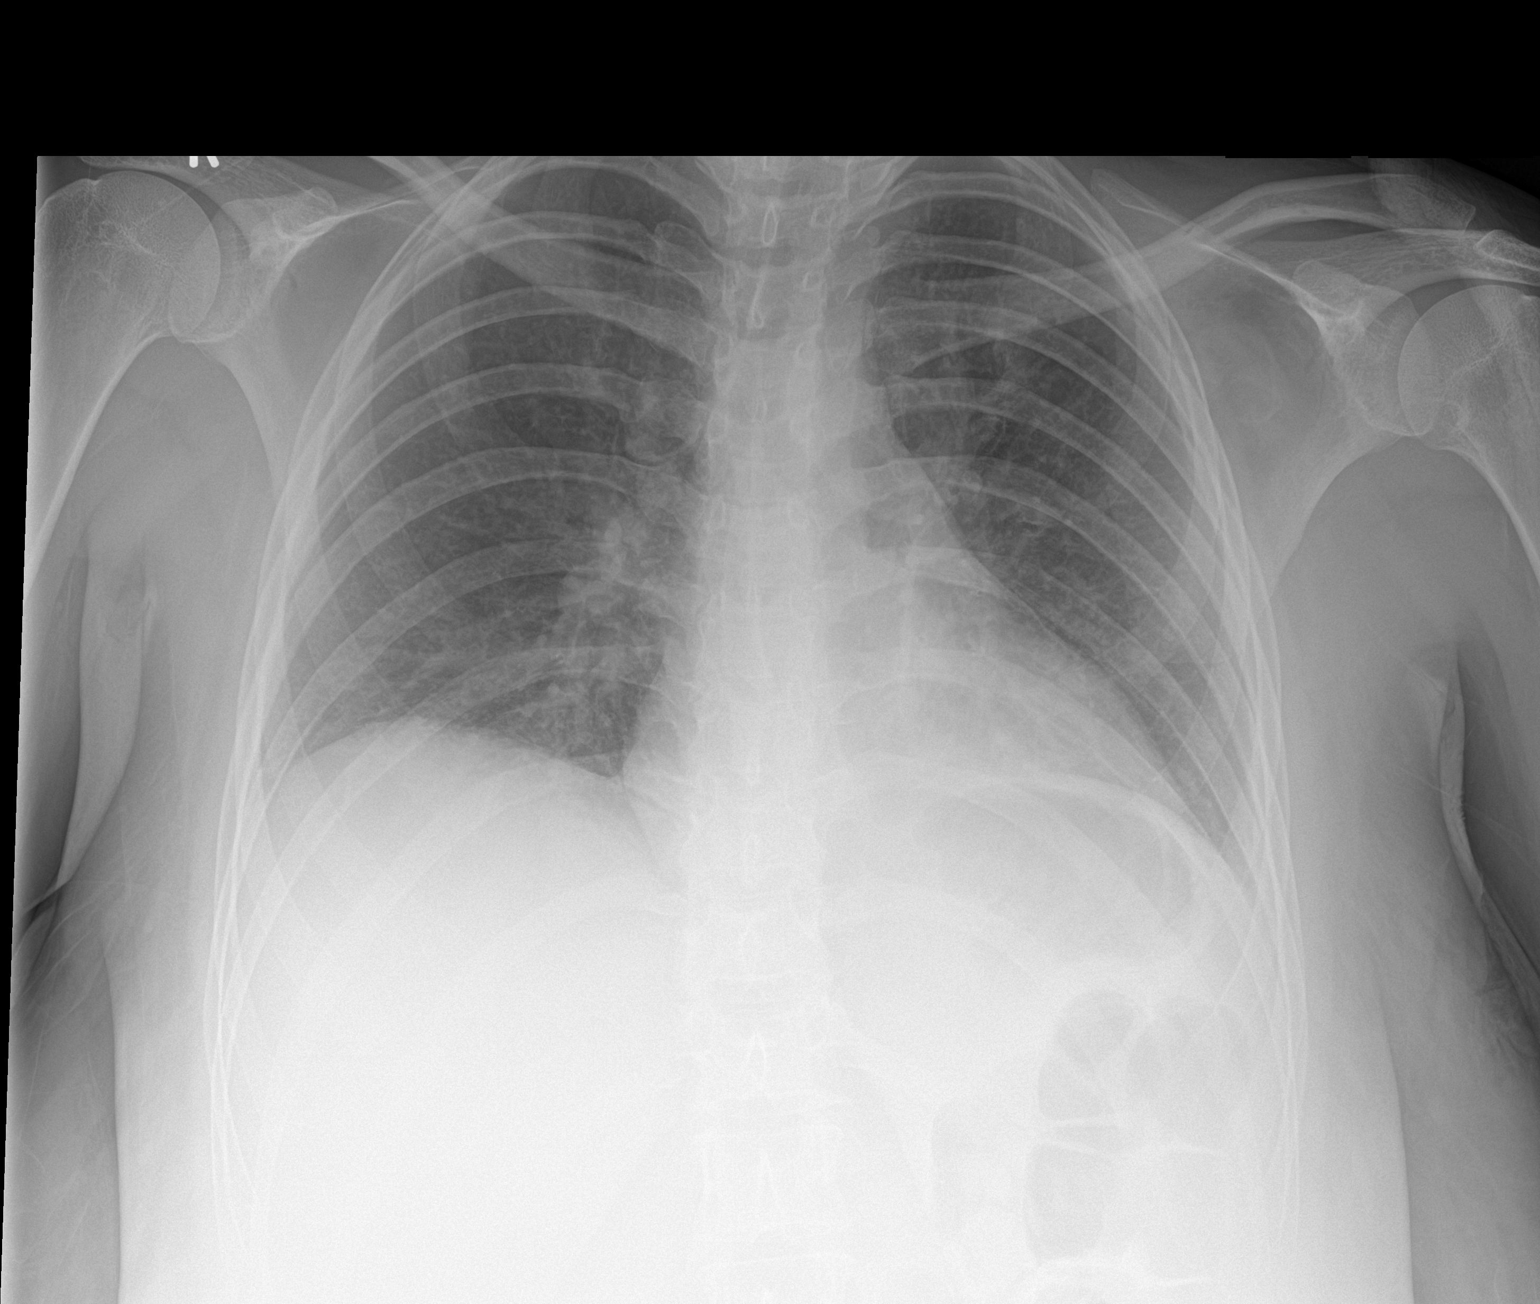

[1 of 1 positions shown; findings below may reference images not displayed]

FINDINGS: The left-sided chest tube has been removed. No pneumothorax is
demonstrated. No significant hemo thorax is observed. There is
minimal subsegmental atelectasis in the retrocardiac region. The
right lung is clear. The heart and pulmonary vascularity are normal.
IMPRESSION: Interval removal of the left-sided chest tube without
re-accumulation of a pneumothorax or hemo thorax.

## 2020-03-27 ENCOUNTER — Emergency Department (HOSPITAL_COMMUNITY): Payer: Medicaid Other

## 2020-03-27 ENCOUNTER — Encounter (HOSPITAL_COMMUNITY): Payer: Self-pay | Admitting: *Deleted

## 2020-03-27 ENCOUNTER — Emergency Department (HOSPITAL_COMMUNITY)
Admission: EM | Admit: 2020-03-27 | Discharge: 2020-03-27 | Discharge status: Against Medical Advice | Payer: Medicaid Other | Attending: Emergency Medicine | Admitting: Emergency Medicine

## 2020-03-27 ENCOUNTER — Other Ambulatory Visit: Payer: Self-pay

## 2020-03-27 DIAGNOSIS — F17210 Nicotine dependence, cigarettes, uncomplicated: Secondary | ICD-10-CM | POA: Diagnosis not present

## 2020-03-27 DIAGNOSIS — R5383 Other fatigue: Secondary | ICD-10-CM | POA: Diagnosis not present

## 2020-03-27 DIAGNOSIS — R1012 Left upper quadrant pain: Secondary | ICD-10-CM | POA: Diagnosis present

## 2020-03-27 DIAGNOSIS — Z91040 Latex allergy status: Secondary | ICD-10-CM | POA: Diagnosis not present

## 2020-03-27 DIAGNOSIS — K8020 Calculus of gallbladder without cholecystitis without obstruction: Secondary | ICD-10-CM | POA: Diagnosis not present

## 2020-03-27 DIAGNOSIS — R05 Cough: Secondary | ICD-10-CM | POA: Insufficient documentation

## 2020-03-27 DIAGNOSIS — Z20822 Contact with and (suspected) exposure to covid-19: Secondary | ICD-10-CM | POA: Diagnosis not present

## 2020-03-27 DIAGNOSIS — R112 Nausea with vomiting, unspecified: Secondary | ICD-10-CM

## 2020-03-27 DIAGNOSIS — R7401 Elevation of levels of liver transaminase levels: Secondary | ICD-10-CM

## 2020-03-27 DIAGNOSIS — Z5329 Procedure and treatment not carried out because of patient's decision for other reasons: Secondary | ICD-10-CM

## 2020-03-27 DIAGNOSIS — R1084 Generalized abdominal pain: Secondary | ICD-10-CM

## 2020-03-27 LAB — CBC
HCT: 39.7 % (ref 36.0–46.0)
Hemoglobin: 12.3 g/dL (ref 12.0–15.0)
MCH: 25.5 pg — ABNORMAL LOW (ref 26.0–34.0)
MCHC: 31 g/dL (ref 30.0–36.0)
MCV: 82.4 fL (ref 80.0–100.0)
Platelets: 214 10*3/uL (ref 150–400)
RBC: 4.82 MIL/uL (ref 3.87–5.11)
RDW: 15.7 % — ABNORMAL HIGH (ref 11.5–15.5)
WBC: 9.1 10*3/uL (ref 4.0–10.5)
nRBC: 0 % (ref 0.0–0.2)

## 2020-03-27 LAB — URINALYSIS, ROUTINE W REFLEX MICROSCOPIC
Bacteria, UA: NONE SEEN
Bilirubin Urine: NEGATIVE
Glucose, UA: NEGATIVE mg/dL
Glucose, UA: NEGATIVE mg/dL
Hgb urine dipstick: NEGATIVE
Hgb urine dipstick: NEGATIVE
Ketones, ur: NEGATIVE mg/dL
Ketones, ur: NEGATIVE mg/dL
Nitrite: NEGATIVE
Nitrite: NEGATIVE
Protein, ur: NEGATIVE mg/dL
Protein, ur: NEGATIVE mg/dL
Specific Gravity, Urine: 1.019 (ref 1.005–1.030)
Specific Gravity, Urine: 1.041 — ABNORMAL HIGH (ref 1.005–1.030)
WBC, UA: >50 WBC/hpf — ABNORMAL HIGH (ref 0–5)
pH: 6 (ref 5.0–8.0)
pH: 7 (ref 5.0–8.0)

## 2020-03-27 LAB — COMPREHENSIVE METABOLIC PANEL
ALT: 440 U/L — ABNORMAL HIGH (ref 0–44)
AST: 172 U/L — ABNORMAL HIGH (ref 15–41)
Albumin: 3.6 g/dL (ref 3.5–5.0)
Alkaline Phosphatase: 230 U/L — ABNORMAL HIGH (ref 38–126)
Anion gap: 11 (ref 5–15)
BUN: 13 mg/dL (ref 6–20)
CO2: 27 mmol/L (ref 22–32)
Calcium: 9.2 mg/dL (ref 8.9–10.3)
Chloride: 100 mmol/L (ref 98–111)
Creatinine, Ser: 0.7 mg/dL (ref 0.44–1.00)
GFR calc Af Amer: >60 mL/min (ref >60)
GFR calc non Af Amer: >60 mL/min (ref >60)
Glucose, Bld: 124 mg/dL — ABNORMAL HIGH (ref 70–99)
Potassium: 3.3 mmol/L — ABNORMAL LOW (ref 3.5–5.1)
Sodium: 138 mmol/L (ref 135–145)
Total Bilirubin: 2.3 mg/dL — ABNORMAL HIGH (ref 0.3–1.2)
Total Protein: 8.1 g/dL (ref 6.5–8.1)

## 2020-03-27 LAB — PROTIME-INR
INR: 1 (ref 0.8–1.2)
Prothrombin Time: 12.9 seconds (ref 11.4–15.2)

## 2020-03-27 LAB — HEPATITIS PANEL, ACUTE
HCV Ab: REACTIVE — AB
Hep A IgM: NONREACTIVE
Hep B C IgM: NONREACTIVE
Hepatitis B Surface Ag: NONREACTIVE

## 2020-03-27 LAB — SARS CORONAVIRUS 2 BY RT PCR (HOSPITAL ORDER, PERFORMED IN ~~LOC~~ HOSPITAL LAB): SARS Coronavirus 2: NEGATIVE

## 2020-03-27 LAB — POC URINE PREG, ED: Preg Test, Ur: NEGATIVE

## 2020-03-27 LAB — BILIRUBIN, DIRECT: Bilirubin, Direct: 1.5 mg/dL — ABNORMAL HIGH (ref 0.0–0.2)

## 2020-03-27 LAB — ACETAMINOPHEN LEVEL: Acetaminophen (Tylenol), Serum: <10 ug/mL — ABNORMAL LOW (ref 10–30)

## 2020-03-27 LAB — LIPASE, BLOOD: Lipase: 18 U/L (ref 11–51)

## 2020-03-27 MED ORDER — SODIUM CHLORIDE 0.9 % IV BOLUS: 1000.0000 mL | Freq: Once | INTRAVENOUS | Status: DC

## 2020-03-27 MED ORDER — PROMETHAZINE HCL 25 MG/ML IJ SOLN: 25.0000 mg | Freq: Once | INTRAMUSCULAR | Status: DC

## 2020-03-27 MED ORDER — IOHEXOL 300 MG/ML  SOLN
100.0000 mL | Freq: Once | INTRAMUSCULAR | Status: AC | PRN
  Administered 2020-03-27: 100 mL via INTRAVENOUS

## 2020-03-27 MED ORDER — ONDANSETRON HCL 4 MG/2ML IJ SOLN
4.0000 mg | Freq: Once | INTRAMUSCULAR | Status: AC
  Administered 2020-03-27: 4 mg via INTRAVENOUS
  Filled 2020-03-27: qty 2

## 2020-03-27 MED ORDER — MORPHINE SULFATE (PF) 4 MG/ML IV SOLN
4.0000 mg | Freq: Once | INTRAVENOUS | Status: AC
  Administered 2020-03-27: 4 mg via INTRAVENOUS
  Filled 2020-03-27: qty 1
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
## 2020-03-27 NOTE — ED Notes (Signed)
Pt informed a urine sample is needed at this time. Pt states she is unable to will try again later.

## 2020-03-27 NOTE — ED Triage Notes (Signed)
Abdominal pain with vomiting x 3 days 

## 2020-03-27 NOTE — ED Notes (Signed)
Patient signed out ama states she could not stay and her ride is here to pick her up.

## 2020-03-28 ENCOUNTER — Observation Stay (HOSPITAL_COMMUNITY)
Admission: EM | Admit: 2020-03-28 | Discharge: 2020-03-28 | Disposition: A | Discharge status: Home / Self Care | Payer: Medicaid Other | Attending: Internal Medicine | Admitting: Internal Medicine

## 2020-03-28 ENCOUNTER — Emergency Department (HOSPITAL_COMMUNITY): Payer: Medicaid Other

## 2020-03-28 ENCOUNTER — Encounter (HOSPITAL_COMMUNITY): Payer: Self-pay

## 2020-03-28 ENCOUNTER — Other Ambulatory Visit: Payer: Self-pay

## 2020-03-28 DIAGNOSIS — K8021 Calculus of gallbladder without cholecystitis with obstruction: Secondary | ICD-10-CM | POA: Diagnosis not present

## 2020-03-28 DIAGNOSIS — Z20822 Contact with and (suspected) exposure to covid-19: Secondary | ICD-10-CM | POA: Diagnosis not present

## 2020-03-28 DIAGNOSIS — R768 Other specified abnormal immunological findings in serum: Secondary | ICD-10-CM | POA: Diagnosis present

## 2020-03-28 DIAGNOSIS — K8071 Calculus of gallbladder and bile duct without cholecystitis with obstruction: Secondary | ICD-10-CM | POA: Diagnosis not present

## 2020-03-28 DIAGNOSIS — Z79899 Other long term (current) drug therapy: Secondary | ICD-10-CM | POA: Insufficient documentation

## 2020-03-28 DIAGNOSIS — F419 Anxiety disorder, unspecified: Secondary | ICD-10-CM | POA: Insufficient documentation

## 2020-03-28 DIAGNOSIS — R1013 Epigastric pain: Secondary | ICD-10-CM | POA: Diagnosis not present

## 2020-03-28 DIAGNOSIS — F1911 Other psychoactive substance abuse, in remission: Secondary | ICD-10-CM

## 2020-03-28 DIAGNOSIS — D649 Anemia, unspecified: Secondary | ICD-10-CM | POA: Insufficient documentation

## 2020-03-28 DIAGNOSIS — K8050 Calculus of bile duct without cholangitis or cholecystitis without obstruction: Secondary | ICD-10-CM | POA: Diagnosis not present

## 2020-03-28 DIAGNOSIS — F17210 Nicotine dependence, cigarettes, uncomplicated: Secondary | ICD-10-CM | POA: Diagnosis not present

## 2020-03-28 DIAGNOSIS — F319 Bipolar disorder, unspecified: Secondary | ICD-10-CM | POA: Diagnosis not present

## 2020-03-28 LAB — CBC
HCT: 36.7 % (ref 36.0–46.0)
Hemoglobin: 11.5 g/dL — ABNORMAL LOW (ref 12.0–15.0)
MCH: 25.7 pg — ABNORMAL LOW (ref 26.0–34.0)
MCHC: 31.3 g/dL (ref 30.0–36.0)
MCV: 82.1 fL (ref 80.0–100.0)
Platelets: 216 10*3/uL (ref 150–400)
RBC: 4.47 MIL/uL (ref 3.87–5.11)
RDW: 16.2 % — ABNORMAL HIGH (ref 11.5–15.5)
WBC: 10.5 10*3/uL (ref 4.0–10.5)
nRBC: 0 % (ref 0.0–0.2)

## 2020-03-28 LAB — COMPREHENSIVE METABOLIC PANEL
ALT: 428 U/L — ABNORMAL HIGH (ref 0–44)
AST: 167 U/L — ABNORMAL HIGH (ref 15–41)
Albumin: 3.5 g/dL (ref 3.5–5.0)
Alkaline Phosphatase: 236 U/L — ABNORMAL HIGH (ref 38–126)
Anion gap: 7 (ref 5–15)
BUN: 10 mg/dL (ref 6–20)
CO2: 27 mmol/L (ref 22–32)
Calcium: 8.8 mg/dL — ABNORMAL LOW (ref 8.9–10.3)
Chloride: 102 mmol/L (ref 98–111)
Creatinine, Ser: 0.66 mg/dL (ref 0.44–1.00)
GFR calc Af Amer: >60 mL/min (ref >60)
GFR calc non Af Amer: >60 mL/min (ref >60)
Glucose, Bld: 115 mg/dL — ABNORMAL HIGH (ref 70–99)
Potassium: 3.5 mmol/L (ref 3.5–5.1)
Sodium: 136 mmol/L (ref 135–145)
Total Bilirubin: 2.2 mg/dL — ABNORMAL HIGH (ref 0.3–1.2)
Total Protein: 7.8 g/dL (ref 6.5–8.1)

## 2020-03-28 LAB — LIPASE, BLOOD: Lipase: 18 U/L (ref 11–51)

## 2020-03-28 LAB — SARS CORONAVIRUS 2 BY RT PCR (HOSPITAL ORDER, PERFORMED IN ~~LOC~~ HOSPITAL LAB): SARS Coronavirus 2: NEGATIVE

## 2020-03-28 MED ORDER — ONDANSETRON HCL 4 MG/2ML IJ SOLN
4.0000 mg | Freq: Once | INTRAMUSCULAR | Status: AC
  Administered 2020-03-28: 4 mg via INTRAVENOUS
  Filled 2020-03-28: qty 2

## 2020-03-28 MED ORDER — METHADONE HCL 10 MG PO TABS
10.0000 mg | ORAL_TABLET | Freq: Two times a day (BID) | ORAL | Status: DC
  Administered 2020-03-28: 10 mg via ORAL
  Filled 2020-03-28: qty 1

## 2020-03-28 MED ORDER — ONDANSETRON HCL 4 MG/2ML IJ SOLN: 4.0000 mg | Freq: Four times a day (QID) | INTRAMUSCULAR | Status: DC | PRN

## 2020-03-28 MED ORDER — HYDROMORPHONE HCL 1 MG/ML IJ SOLN
1.0000 mg | Freq: Once | INTRAMUSCULAR | Status: AC
  Administered 2020-03-28: 1 mg via INTRAVENOUS
  Filled 2020-03-28: qty 1

## 2020-03-28 MED ORDER — MORPHINE SULFATE (PF) 2 MG/ML IV SOLN
1.0000 mg | INTRAVENOUS | Status: DC | PRN
  Administered 2020-03-28: 2 mg via INTRAVENOUS
  Filled 2020-03-28: qty 1

## 2020-03-28 MED ORDER — SODIUM CHLORIDE 0.9 % IV SOLN
2.0000 g | INTRAVENOUS | Status: DC
  Administered 2020-03-28: 2 g via INTRAVENOUS
  Filled 2020-03-28: qty 20

## 2020-03-28 MED ORDER — NICOTINE 14 MG/24HR TD PT24: 14.0000 mg | MEDICATED_PATCH | Freq: Every day | TRANSDERMAL | Status: DC

## 2020-03-28 MED ORDER — SODIUM CHLORIDE 0.9 % IV SOLN: INTRAVENOUS | Status: DC

## 2020-03-28 MED ORDER — HEPARIN SODIUM (PORCINE) 5000 UNIT/ML IJ SOLN: 5000.0000 [IU] | Freq: Three times a day (TID) | INTRAMUSCULAR | Status: DC

## 2020-03-28 MED ORDER — PROMETHAZINE HCL 25 MG/ML IJ SOLN: 25.0000 mg | Freq: Four times a day (QID) | INTRAMUSCULAR | Status: DC | PRN

## 2020-03-28 MED ORDER — ONDANSETRON HCL 4 MG PO TABS: 4.0000 mg | ORAL_TABLET | Freq: Four times a day (QID) | ORAL | Status: DC | PRN

## 2020-03-28 MED ORDER — POLYETHYLENE GLYCOL 3350 17 G PO PACK: 17.0000 g | PACK | Freq: Every day | ORAL | Status: DC

## 2020-03-28 MED ORDER — OXYCODONE HCL 5 MG PO TABS: 5.0000 mg | ORAL_TABLET | ORAL | Status: DC | PRN

## 2020-03-28 NOTE — H&P (Signed)
TRH H&P   Patient Demographics:    Abigail [name], is a 29 y.o. female  MRN: [id number]   DOB - 12/23/1990  Admit Date - 03/28/2020  Outpatient Primary MD for the patient is Celene Squibb, MD  Referring MD/NP/PA: Dr Rogene Houston  Patient coming from: Home  Chief Complaint  Patient presents with  . Abdominal Pain      HPI:    Abigail [name]  is a 29 y.o. female, with past medical history of IV drug abuse(she relapsed, and using IV fentanyl since December 2020) history of hepatitis C infection(reports in 2017, but her viral load was negative), due to complaints of abdominal pain, nausea and vomiting, reports last time she used IV fentanyl this morning, reports she is feeling some withdrawals, generalized body ache, chills currently, but reports her main complaints is right upper quadrant abdominal pain, intermittent nausea and vomiting, reports symptoms has been going on for last 3 days, patient was in ED yesterday, for which CT scan raises concern for acute cholecystitis, recommendation for ultrasound, but patient left home, and she came back today to finish her work-up, she denies any history of similar pain in the past. - in ED ultrasound significant for cholecystolithiasis, no findings to suggest acute cholecystitis, and yesterday significant for chronic cholelithiasis with abnormal gallbladder pericholecystic fluid versus wall thickening work-up done yesterday significant for positive HCV antibody, her alk phos was 236, AST 167, ALT 428, total bilirubin of 2.2, white blood cell count of 10.5K, ED physician discussed with GI and general surgery, who requested right hospitalist to admit and they will consult.   Patient was seen and examined with the presence of female chaperone Autum from environmental service.   Review of systems:    In addition to the HPI above, No  Fever-chills, No Headache, No changes with Vision or hearing, No problems swallowing food or Liquids, No Chest pain, Cough or Shortness of Breath, Reports abdominal pain, nausea and vomiting., Bowel movements are regular, No Blood in stool or Urine, No dysuria, No new skin rashes or bruises, No new joints pains-aches,  No new weakness, tingling, numbness in any extremity, No recent weight gain or loss, No polyuria, polydypsia or polyphagia, No significant Mental Stressors.  A full 10 point Review of Systems was done, except as stated above, all other Review of Systems were negative.   With Past History of the following :    Past Medical History:  Diagnosis Date  . Anemia   . Anxiety   . Bipolar 1 disorder ([place])   . BV (bacterial vaginosis) 07/17/2015  . Chronic back pain   . Depression   . Drug abuse ([place]) 07/17/2015  . Fracture, ribs leftside  . Liver laceration   . Pneumothorax left  . Polysubstance abuse ([facility])    opiods, cocaine, marijuana, heroin  . PTSD (post-traumatic stress disorder)   . PTSD (post-traumatic stress  disorder)   . Substance induced mood disorder [facility])       Past Surgical History:  Procedure Laterality Date  . ADENOIDECTOMY    . BLADDER SURGERY     urethral dilatation and bladder dilitation  . ORIF ANKLE FRACTURE Right 09/01/2015   Procedure: OPEN REDUCTION INTERNAL FIXATION (ORIF)  OPEN FOOT FRACTURES DISLOCATION;  Surgeon: Altamese [place], MD;  Location: [place];  Service: Orthopedics;  Laterality: Right;  . TONSILLECTOMY        Social History:     Social History   Tobacco Use  . Smoking status: Current Every Day Smoker    Packs/day: 0.50    Years: 9.00    Pack years: 4.50    Types: Cigarettes    Last attempt to quit: 09/01/2015    Years since quitting: 4.5  . Smokeless tobacco: Never Used  Substance Use Topics  . Alcohol use: Yes    Alcohol/week: 6.0 - 8.0 standard drinks    Types: 6 - 8 Cans of beer per week        Family  History :     Family History  Problem Relation Age of Onset  . Anxiety disorder Mother   . Miscarriages / [place] Mother   . Hypertension Father   . Heart disease Father   . Depression Father   . Bipolar disorder Father   . Thyroid disease Father   . Anxiety disorder Father   . Early death Father   . Depression Brother   . Heart disease Brother   . Hypertension Brother   . Diabetes Brother        borderline  . Anxiety disorder Brother   . Bipolar disorder Brother   . Thyroid disease Brother   . Diabetes Maternal Grandmother   . Heart disease Maternal Grandmother   . Diabetes Maternal Grandfather   . Heart disease Maternal Grandfather      Home Medications:   Patient reports she is not taking any home medications.   Allergies:     Allergies  Allergen Reactions  . Latex Swelling  . Penicillins Other (See Comments)    REACTION: Childhood allergy/unknown reaction Has patient had a PCN reaction causing immediate rash, facial/tongue/throat swelling, SOB or lightheadedness with Has patient had a PCN reaction causing severe rash involving mucus membranes or skin necrosis: NO Has patient had a PCN reaction that required hospitalization NO Has patient had a PCN reaction occurring within the last 10 years: NO If all of the above answers are "NO", then may proceed with Cephalosporin use.     Physical Exam:   Vitals  Blood pressure (!) 149/80, pulse 70, temperature 99 F (37.2 C), temperature source Oral, resp. rate 18, last menstrual period 03/14/2020, SpO2 94 %.   1. General well developed female, laying in bed, in no apparent distress  2. Normal affect and insight, Not Suicidal or Homicidal, Awake Alert, Oriented X 3.  3. No F.N deficits, ALL C.Nerves Intact, Strength 5/5 all 4 extremities, Sensation intact all 4 extremities, Plantars down going.  4. Ears and Eyes appear Normal, Conjunctivae clear, PERRLA. Moist Oral Mucosa.  5. Supple Neck, No JVD, No cervical  lymphadenopathy appriciated, No Carotid Bruits.  6. Symmetrical Chest wall movement, Good air movement bilaterally, CTAB.  7. RRR, No Gallops, Rubs or Murmurs, No Parasternal Heave.  8. Positive Bowel Sounds, Abdomen Soft, right upper quadrant mild tenderness, No organomegaly appriciated,No rebound -guarding or rigidity.  9.  No Cyanosis, Normal Skin Turgor, No Skin  Rash or Bruise.  10. Good muscle tone,  joints appear normal , no effusions, Normal ROM.    Data Review:    CBC Recent Labs  Lab 03/27/20 1139 03/28/20 1149  WBC 9.1 10.5  HGB 12.3 11.5*  HCT 39.7 36.7  PLT 214 216  MCV 82.4 82.1  MCH 25.5* 25.7*  MCHC 31.0 31.3  RDW 15.7* 16.2*   ------------------------------------------------------------------------------------------------------------------  Chemistries  Recent Labs  Lab 03/27/20 1139 03/28/20 1149  NA 138 136  K 3.3* 3.5  CL 100 102  CO2 27 27  GLUCOSE 124* 115*  BUN 13 10  CREATININE 0.70 0.66  CALCIUM 9.2 8.8*  AST 172* 167*  ALT 440* 428*  ALKPHOS 230* 236*  BILITOT 2.3* 2.2*   ------------------------------------------------------------------------------------------------------------------ CrCl cannot be calculated (Unknown ideal weight.). ------------------------------------------------------------------------------------------------------------------ No results for input(s): TSH, T4TOTAL, T3FREE, THYROIDAB in the last 72 hours.  Invalid input(s): FREET3  Coagulation profile Recent Labs  Lab 03/27/20 1320  INR 1.0   ------------------------------------------------------------------------------------------------------------------- No results for input(s): DDIMER in the last 72 hours. -------------------------------------------------------------------------------------------------------------------  Cardiac Enzymes No results for input(s): CKMB, TROPONINI, MYOGLOBIN in the last 168 hours.  Invalid input(s):  CK ------------------------------------------------------------------------------------------------------------------ No results found for: BNP   ---------------------------------------------------------------------------------------------------------------  Urinalysis    Component Value Date/Time   COLORURINE AMBER (A) 03/27/2020 1431   APPEARANCEUR HAZY (A) 03/27/2020 1431   APPEARANCEUR Cloudy (A) 07/17/2015 1631   LABSPEC 1.041 (H) 03/27/2020 1431   PHURINE 7.0 03/27/2020 1431   GLUCOSEU NEGATIVE 03/27/2020 1431   HGBUR NEGATIVE 03/27/2020 1431   BILIRUBINUR NEGATIVE 03/27/2020 1431   BILIRUBINUR Negative 07/17/2015 1631   KETONESUR NEGATIVE 03/27/2020 1431   PROTEINUR NEGATIVE 03/27/2020 1431   UROBILINOGEN 0.2 01/28/2016 1140   NITRITE NEGATIVE 03/27/2020 1431   LEUKOCYTESUR MODERATE (A) 03/27/2020 1431    ----------------------------------------------------------------------------------------------------------------   Imaging Results:    CT Abdomen Pelvis W Contrast  Result Date: 03/27/2020 CLINICAL DATA:  28 year old female with right lower quadrant and left upper quadrant abdominal pain. Abnormal LFTs, questionable history of hepatitis C. vomiting. EXAM: CT ABDOMEN AND PELVIS WITH CONTRAST TECHNIQUE: Multidetector CT imaging of the abdomen and pelvis was performed using the standard protocol following bolus administration of intravenous contrast. CONTRAST:  159m OMNIPAQUE IOHEXOL 300 MG/ML  SOLN COMPARISON:  Noncontrast CT Abdomen and Pelvis 08/27/2017 FINDINGS: Lower chest: Negative. Hepatobiliary: Appearance suspicious for mild periportal edema (series 2, image 23). No bile duct enlargement. No discrete liver lesion. Abnormal gallbladder with pericholecystic fluid or wall thickening (series 2, image 37), but no additional pericholecystic inflammatory stranding. However, a gallstone is evident on coronal image 41, roughly 16 mm diameter. Pancreas: Negative. Spleen: Splenic  volume estimated at 432 mL (normal splenic volume range 83 - 412 mL). No discrete splenic lesion. Adrenals/Urinary Tract: Normal adrenal glands. Symmetric renal enhancement. No nephrolithiasis. No hydronephrosis. No perinephric stranding. Proximal ureters appear within normal limits. Diminutive and unremarkable urinary bladder. Incidental chronic pelvic phleboliths. Stomach/Bowel: Retained stool in redundant large bowel. Normal appendix on series 2, image 66. No large bowel inflammation. No dilated small bowel. Terminal ileum appears within normal limits. Decompressed stomach and duodenum. No free air. Vascular/Lymphatic: Major arterial structures in the abdomen and pelvis appear patent and normal. Portal venous system is patent. Reproductive: Within normal limits, physiologic appearing right ovarian cyst with some rim enhancement (series 2, image 74). Other: No pelvic free fluid. Musculoskeletal: Negative. IMPRESSION: 1. Chronic cholelithiasis with abnormal gallbladder pericholecystic fluid versus wall thickening. Mild periportal edema also. While hepatitis could explain pericholecystic  fluid without the presence of acute cholecystitis, Right Upper Quadrant Ultrasound would be complementary in this setting. 2. Mild splenomegaly. 3. No other acute or inflammatory process identified in the abdomen or pelvis. Normal appendix. Electronically Signed   By: Genevie Ann M.D.   On: 03/27/2020 14:28   US Abdomen Limited  Result Date: 03/28/2020 CLINICAL DATA:  Cholecystitis. Additional history provided by scanning technologist: Recurrent pain with nausea. EXAM: ULTRASOUND ABDOMEN LIMITED RIGHT UPPER QUADRANT COMPARISON:  CT abdomen/pelvis 03/27/2020 FINDINGS: Gallbladder: The patient was reportedly not NPO prior to the examination. As a result, the gallbladder is slightly contracted. Cholecystolithiasis. The gallbladder wall measures at the upper limits of normal in thickness, 3 mm. There is no appreciable pericholecystic  fluid. No sonographic Percell Miller sign is elicited by the scanning technologist. Common bile duct: Diameter: 2 mm, within normal limits. Liver: No focal lesion identified. Within normal limits in parenchymal echogenicity. Portal vein is patent on color Doppler imaging with normal direction of blood flow towards the liver. IMPRESSION: The patient was reportedly not NPO prior to the examination. As a result, the gallbladder is slightly contracted. Cholecystolithiasis. There are no specific secondary sonographic findings to suggest acute cholecystitis. However, if there is persistent clinical concern, consider a nuclear medicine HIDA scan for further evaluation. The visualized common duct is normal in diameter. Electronically Signed   By: Kellie Simmering DO   On: 03/28/2020 12:50      Assessment & Plan:    Active Problems:   Hepatitis C antibody test positive   History of drug abuse ([place])   Choledocholithiasis    Cholecystolithiasis -She presents with right upper quadrant abdominal pain, imaging significant for cholecystolithiasis, but as well she has some surrounding edema of the gallbladder, unclear if she is having acute cholecystitis, but she is tender in the right upper quadrant, for now I will start her empirically on Rocephin, keep on clear liquid diet. -GI and general surgery has been consulted. -Continue with IV fluids  IV drug abuse -Patient reports she relapsed, currently using IV fentanyl, most recent was this morning. -Will be kept on IV morphine for her right upper quadrant pain, which should prevent withdrawals. -Check hep C viral load, check HIV.  Transaminitis -Unclear if this is related to cholecystolithiasis, versus hepatitis, patient she relapsed, she is using IV drugs again, so she is at high risk for recurrent hepatitis C infection, will go ahead and check viral load. -Await further recommendation from GI, avoid hepatotoxic medications.  History of hepatitis C -Please see above  discussion  DVT Prophylaxis Heparin -  SCDs  AM Labs Ordered, also please review Full Orders  Family Communication: Admission, patients condition and plan of care including tests being ordered have been discussed with the patient  who indicate understanding and agree with the plan and Code Status.  Code Status Full  Likely DC to Home  Condition GUARDED    Consults called:   GI, surgery by ED  Admission status: Observation  Time spent in minutes : 60 minutes   Phillips Climes M.D on 03/28/2020 at 3:42 PM   [facility] - Office  [phone]

## 2020-03-28 NOTE — ED Triage Notes (Signed)
Pt here today for abdominal pain and vomiting. Received CT scan yesterday and was supposed to get an ultrasound. Due to childcare, she couldn't stay. Is back today for [place] and possible admission.

## 2020-03-28 NOTE — Progress Notes (Signed)
Called into the room by the patient. She is tearful and states she wants to leave. Patient states that she is "drug sick" and we cannot help her. Educated patient on the benefits of staying and the risk of leaving AMA. Patient states she still wants to leave. AMA paperwork signed and IV removed.

## 2020-03-28 NOTE — Consult Note (Signed)
Referring Provider: Vanetta Mulders, MD and Carlisle Beers, MD Primary Care Physician:  Benita Stabile, MD Primary Gastroenterologist:  Dr. Karilyn Cota  Reason for Consultation:   Cholelithiasis and abnormal LFTs.  HPI:   Patient is 29 year old Caucasian female with history of IV drug use bipolar disorder who presented to emergency room last night with epigastric pain of 3 days duration along with nausea and vomiting.  She also had been experiencing intrascapular pain.  She was noted to have abnormal LFTs.  Caron Presume was 2.3 with AP of 230 AST of 172 and ALT of 40.  Patient left the emergency room to return this morning.  Serum lipase was normal at 18.  Urinalysis is abnormal revealing greater than 50 WBCs per high power field.  Abdominal pelvic CT was obtained which revealed cholelithiasis with gallbladder wall thickening versus pericholecystic fluid and mild periportal edema.  She had mild splenomegaly and bile duct was not dilated.  Patient decided to leave to return this morning.  Ultrasound was performed feeling gallstones.  Gallbladder wall measured 3 mm.  There was no pericholecystic fluid.  Bile duct was normal measuring 2 mm.  Her LFTs were repeated this morning without significant change from yesterday.  See below. Patient says she has been having intermittent epigastric and intrascapular pain.  He had an episode back in November 2020 when she was seen by her primary care physician Dr. Ian Malkin call in her symptoms are felt to be due to GERD.  She has had these symptoms on other occasions as well.  She denies fever or chills.  She has chronic constipation.  Her bowels move once every 2 to 4 weeks.  No history of melena or rectal bleeding. Patient has a history of elevated transaminases back in September 2018 she was noted to have positive hepatitis C virus antibody but HCVRNA was undetectable. The studies have been ordered by Dr. Deretha Emory.  Patient is single.  She lives with a boyfriend.  She has 2  children ages 27 and 69.  Her brother looks after her children.  She is presently unemployed.  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 She says she has been using IV drugs since age 7.  She continued through 01-Dec-2015.  She went on Suboxone program and remained dry until December 2020.  She is back to using IV fentanyl.  She does not drink alcohol but she smokes up to 2 cigarettes/day. She graduated from high school in Dec 01, 2010 Father died of MI at age 64.  Mother is 36.  She has a brother age 37 with coronary artery disease and hypertension.    Past Medical History:  Diagnosis Date  . Anemia   . Anxiety   . Bipolar 1 disorder (HCC)   . BV (bacterial vaginosis) 07/17/2015  . Chronic back pain   . Depression   . Drug abuse (HCC) 07/17/2015  . Fracture, ribs leftside  . Liver laceration   . Pneumothorax left  . Polysubstance abuse (HCC)    opiods, cocaine, marijuana, heroin  . PTSD (post-traumatic stress disorder)   . PTSD (post-traumatic stress disorder)   . Substance induced mood disorder [facility]     Past Surgical History:  Procedure Laterality Date  . ADENOIDECTOMY    . BLADDER SURGERY     urethral dilatation and bladder dilitation  . ORIF ANKLE FRACTURE Right 09/01/2015   Procedure: OPEN REDUCTION INTERNAL FIXATION (ORIF)  OPEN FOOT FRACTURES DISLOCATION;  Surgeon: Myrene Galas, MD;  Location: [facility] OR;  Service: Orthopedics;  Laterality: Right;  . TONSILLECTOMY      Prior to Admission medications   Not on File    Current Facility-Administered Medications  Medication Dose Route Frequency Provider Last Rate Last Admin  . 0.9 %  sodium chloride infusion   Intravenous Continuous Vanetta Mulders, MD 100 mL/hr at 03/28/20 1151 New Bag at 03/28/20 1151  . morphine 2 MG/ML injection 1-2 mg  1-2 mg Intravenous Q3H PRN Elgergawy, Leana Roe, MD   2 mg at 03/28/20 1658  . polyethylene glycol (MIRALAX / GLYCOLAX) packet 17 g  17 g Oral QHS  Jakhia Buxton, Joline Maxcy, MD       No current outpatient medications on file.    Allergies as of 03/28/2020 - Review Complete 03/28/2020  Allergen Reaction Noted  . Latex Swelling 06/08/2012  . Penicillins Other (See Comments) 06/08/2012    Family History  Problem Relation Age of Onset  . Anxiety disorder Mother   . Miscarriages / [place] Mother   . Hypertension Father   . Heart disease Father   . Depression Father   . Bipolar disorder Father   . Thyroid disease Father   . Anxiety disorder Father   . Early death Father   . Depression Brother   . Heart disease Brother   . Hypertension Brother   . Diabetes Brother        borderline  . Anxiety disorder Brother   . Bipolar disorder Brother   . Thyroid disease Brother   . Diabetes Maternal Grandmother   . Heart disease Maternal Grandmother   . Diabetes Maternal Grandfather   . Heart disease Maternal Grandfather     Social History   Socioeconomic History  . Marital status: Single    Spouse name: Not on file  . Number of children: Not on file  . Years of education: Not on file  . Highest education level: Not on file  Occupational History  . Occupation: Consulting civil engineer    CommentAir cabin crew  . Occupation: [employer]  Tobacco Use  . Smoking status: Current Every Day Smoker    Packs/day: 0.50    Years: 9.00    Pack years: 4.50    Types: Cigarettes    Last attempt to quit: 09/01/2015    Years since quitting: 4.5  . Smokeless tobacco: Never Used  Vaping Use  . Vaping Use: Former  Substance and Sexual Activity  . Alcohol use: Yes    Alcohol/week: 6.0 - 8.0 standard drinks    Types: 6 - 8 Cans of beer per week  . Drug use: Yes    Types: Marijuana    Comment: History dilaudid/heroine  . Sexual activity: Not Currently    Partners: Male    Birth control/protection: None  Other Topics Concern  . Not on file  Social History Narrative   Grew up in [place], [place]. 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 Not married.  Has two children. Has custody of one child, other child with brother. Has one year old and 73 year old. Has a babysitter that takes care of child. Works at [employer]. Sleeping well. No drug use in over 18 months.    Social Determinants of Health   Financial Resource Strain:   . Difficulty of Paying Living Expenses:   Food Insecurity:   . Worried About Programme researcher, broadcasting/film/video in the Last Year:   . [employer] of Food in the Last Year:  Transportation Needs:   . Freight forwarder (Medical):   Marland Kitchen Lack of Transportation (Non-Medical):   Physical Activity:   . Days of Exercise per Week:   . Minutes of Exercise per Session:   Stress:   . Feeling of Stress :   Social Connections:   . Frequency of Communication with Friends and Family:   . Frequency of Social Gatherings with Friends and Family:   . Attends Religious Services:   . Active Member of Clubs or Organizations:   . Attends Banker Meetings:   Marland Kitchen Marital Status:   Intimate Partner Violence:   . Fear of Current or Ex-Partner:   . Emotionally Abused:   Marland Kitchen Physically Abused:   . Sexually Abused:     Review of Systems: See HPI, otherwise normal ROS  Physical Exam: Temp:  [99 F (37.2 C)] 99 F (37.2 C) (08/18 1044) Pulse Rate:  [70-77] 77 (08/18 1548) Resp:  [15-18] 18 (08/18 1548) BP: (128-152)/(80-93) 152/93 (08/18 1548) SpO2:  [94 %-99 %] 99 % (08/18 1548)   Patient is alert and in no acute distress but in tears. Patient was examined in emergency room. Conjunctivae is pink.  Sclera is nonicteric. Oropharyngeal mucosa is normal. Neck without masses or thyromegaly. Cardiac exam with regular rhythm normal S1 and S2.  No murmur or gallop noted. Auscultation lungs reveal vesicular breath sounds bilaterally. Abdomen is symmetrical bowel sounds are normal.  On palpation abdomen is soft with mild tenderness in midepigastric region and below the right costal margin.  No organomegaly or masses. Please note patient  was examined after she received IV morphine. No peripheral edema or clubbing noted.  Lab Results: Recent Labs    03/27/20 1139 03/28/20 1149  WBC 9.1 10.5  HGB 12.3 11.5*  HCT 39.7 36.7  PLT 214 216   BMET Recent Labs    03/27/20 1139 03/28/20 1149  NA 138 136  K 3.3* 3.5  CL 100 102  CO2 27 27  GLUCOSE 124* 115*  BUN 13 10  CREATININE 0.70 0.66  CALCIUM 9.2 8.8*   LFT Recent Labs    03/27/20 1139 03/27/20 1320 03/28/20 1149  PROT   < >  --  7.8  ALBUMIN   < >  --  3.5  AST   < >  --  167*  ALT   < >  --  428*  ALKPHOS   < >  --  236*  BILITOT   < >  --  2.2*  BILIDIR  --  1.5*  --    < > = values in this interval not displayed.   PT/INR Recent Labs    03/27/20 1320  LABPROT 12.9  INR 1.0   Hepatitis Panel Recent Labs    03/27/20 1320  HEPBSAG NON REACTIVE  HCVAB Reactive*  HEPAIGM NON REACTIVE  HEPBIGM NON REACTIVE    Studies/Results: CT Abdomen Pelvis W Contrast  Result Date: 03/27/2020 CLINICAL DATA:  29 year old female with right lower quadrant and left upper quadrant abdominal pain. Abnormal LFTs, questionable history of hepatitis C. vomiting. EXAM: CT ABDOMEN AND PELVIS WITH CONTRAST TECHNIQUE: Multidetector CT imaging of the abdomen and pelvis was performed using the standard protocol following bolus administration of intravenous contrast. CONTRAST:  OMNIPAQUE IOHEXOL 300 MG/ML  SOLN COMPARISON:  Noncontrast CT Abdomen and Pelvis 08/27/2017 FINDINGS: Lower chest: Negative. Hepatobiliary: Appearance suspicious for mild periportal edema (series 2, image 23). No bile duct enlargement. No discrete liver lesion. Abnormal gallbladder with  pericholecystic fluid or wall thickening (series 2, image 37), but no additional pericholecystic inflammatory stranding. However, a gallstone is evident on coronal image 41, roughly 16 mm diameter. Pancreas: Negative. Spleen: Splenic volume estimated at 432 mL (normal splenic volume range 83 - 412 mL). No discrete  splenic lesion. Adrenals/Urinary Tract: Normal adrenal glands. Symmetric renal enhancement. No nephrolithiasis. No hydronephrosis. No perinephric stranding. Proximal ureters appear within normal limits. Diminutive and unremarkable urinary bladder. Incidental chronic pelvic phleboliths. Stomach/Bowel: Retained stool in redundant large bowel. Normal appendix on series 2, image 66. No large bowel inflammation. No dilated small bowel. Terminal ileum appears within normal limits. Decompressed stomach and duodenum. No free air. Vascular/Lymphatic: Major arterial structures in the abdomen and pelvis appear patent and normal. Portal venous system is patent. Reproductive: Within normal limits, physiologic appearing right ovarian cyst with some rim enhancement (series 2, image 74). Other: No pelvic free fluid. Musculoskeletal: Negative. IMPRESSION: 1. Chronic cholelithiasis with abnormal gallbladder pericholecystic fluid versus wall thickening. Mild periportal edema also. While hepatitis could explain pericholecystic fluid without the presence of acute cholecystitis, Right Upper Quadrant Ultrasound would be complementary in this setting. 2. Mild splenomegaly. 3. No other acute or inflammatory process identified in the abdomen or pelvis. Normal appendix. Electronically Signed   By: Odessa FlemingH  Hall M.D.   On: 03/27/2020 14:28   US Abdomen Limited  Result Date: 03/28/2020 CLINICAL DATA:  Cholecystitis. Additional history provided by scanning technologist: Recurrent pain with nausea. EXAM: ULTRASOUND ABDOMEN LIMITED RIGHT UPPER QUADRANT COMPARISON:  CT abdomen/pelvis 03/27/2020 FINDINGS: Gallbladder: The patient was reportedly not NPO prior to the examination. As a result, the gallbladder is slightly contracted. Cholecystolithiasis. The gallbladder wall measures at the upper limits of normal in thickness, 3 mm. There is no appreciable pericholecystic fluid. No sonographic Eulah PontMurphy sign is elicited by the scanning technologist. Common  bile duct: Diameter: 2 mm, within normal limits. Liver: No focal lesion identified. Within normal limits in parenchymal echogenicity. Portal vein is patent on color Doppler imaging with normal direction of blood flow towards the liver. IMPRESSION: The patient was reportedly not NPO prior to the examination. As a result, the gallbladder is slightly contracted. Cholecystolithiasis. There are no specific secondary sonographic findings to suggest acute cholecystitis. However, if there is persistent clinical concern, consider a nuclear medicine HIDA scan for further evaluation. The visualized common duct is normal in diameter. Electronically Signed   By: Jackey LogeKyle  Golden DO   On: 03/28/2020 12:50  Ultrasound reviewed with Dr. Ulyses SouthwardMark Boles  Assessment;  Patient is 29 year old Caucasian female who presents with epigastric, right upper quadrant and intrascapular pain associated with nausea and vomiting.  Ultrasound revealed gallbladder full of stones.  She also had elevated transaminases but bile duct only measures 2 to 3 mm. She has history of elevated transaminases in the past along with positive HCV antibody but HCVRNA/viral load was negative in September 2018.  It is quite possible that her elevated transaminases although there higher than 1 would typically see with chronic hepatitis are due to hepatitis C given ongoing IV drug use.  She certainly could have passed a small stone as well. There is no indication for ERCP at this time.  Surgical consultation is pending.  I did talk with Dr. Franky MachoMark Jenkins over the phone. She will be made n.p.o. after midnight.  Recommendations;  Repeat LFTs in a.m. Wait for results of HCVRNA. I would recommend liver biopsy and intraoperative cholangiogram if at all possible at the time of cholecystectomy.  Discussed with Dr.  Jenkins.   LOS: 0 days   Erle Guster  03/28/2020, 5:13 PM

## 2020-03-28 NOTE — Progress Notes (Addendum)
1835: Pt arrived to floor via WC from ED. VSS. Oriented to room and safety measures. States understanding. 1900: Pt requesting medication for nausea and nicotine patch. Advised ordered Zofran not due till 2145 but will contact covering MD with request for additional antinausea med and nicotine patch. Pt states, "Look, I'm drug-sick. I know its making everything worse and I'm trying to hang on. I appreciate your help." Oncoming nurse Wyn Quaker, RN notified of pt's c/o and request. On call PA notified via Amion, awaiting response.  ** correction: on-coming nurse is Stormy Fabian, RN**
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
## 2020-03-29 LAB — HEPATITIS C VRS RNA DETECT BY PCR-QUAL: Hepatitis C Vrs RNA by PCR-Qual: POSITIVE — AB

## 2020-05-20 ENCOUNTER — Other Ambulatory Visit: Payer: Self-pay

## 2020-05-20 ENCOUNTER — Emergency Department (HOSPITAL_COMMUNITY)
Admission: EM | Admit: 2020-05-20 | Discharge: 2020-05-20 | Disposition: A | Discharge status: Home / Self Care | Payer: Medicaid Other | Attending: Emergency Medicine | Admitting: Emergency Medicine

## 2020-05-20 ENCOUNTER — Encounter (HOSPITAL_COMMUNITY): Payer: Self-pay

## 2020-05-20 DIAGNOSIS — F1910 Other psychoactive substance abuse, uncomplicated: Secondary | ICD-10-CM | POA: Insufficient documentation

## 2020-05-20 DIAGNOSIS — F17210 Nicotine dependence, cigarettes, uncomplicated: Secondary | ICD-10-CM | POA: Diagnosis not present

## 2020-05-20 DIAGNOSIS — R4182 Altered mental status, unspecified: Secondary | ICD-10-CM | POA: Diagnosis present

## 2020-05-20 NOTE — ED Notes (Signed)
Pt left running out the EMS doors yelling.  PD made aware.
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
## 2020-05-20 NOTE — ED Notes (Signed)
Secretary says police took pt home.
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
## 2020-06-03 ENCOUNTER — Emergency Department (HOSPITAL_COMMUNITY)
Admission: EM | Admit: 2020-06-03 | Discharge: 2020-06-03 | Disposition: A | Discharge status: Home / Self Care | Payer: Medicaid Other | Attending: Emergency Medicine | Admitting: Emergency Medicine

## 2020-06-03 ENCOUNTER — Encounter (HOSPITAL_COMMUNITY): Payer: Self-pay | Admitting: Emergency Medicine

## 2020-06-03 ENCOUNTER — Other Ambulatory Visit: Payer: Self-pay

## 2020-06-03 DIAGNOSIS — F1110 Opioid abuse, uncomplicated: Secondary | ICD-10-CM | POA: Diagnosis present

## 2020-06-03 DIAGNOSIS — Z91040 Latex allergy status: Secondary | ICD-10-CM | POA: Insufficient documentation

## 2020-06-03 DIAGNOSIS — F17210 Nicotine dependence, cigarettes, uncomplicated: Secondary | ICD-10-CM | POA: Insufficient documentation

## 2020-06-03 LAB — COMPREHENSIVE METABOLIC PANEL
ALT: 52 U/L — ABNORMAL HIGH (ref 0–44)
AST: 19 U/L (ref 15–41)
Albumin: 4.4 g/dL (ref 3.5–5.0)
Alkaline Phosphatase: 74 U/L (ref 38–126)
Anion gap: 11 (ref 5–15)
BUN: 12 mg/dL (ref 6–20)
CO2: 26 mmol/L (ref 22–32)
Calcium: 9.6 mg/dL (ref 8.9–10.3)
Chloride: 103 mmol/L (ref 98–111)
Creatinine, Ser: 0.73 mg/dL (ref 0.44–1.00)
GFR, Estimated: >60 mL/min (ref >60)
Glucose, Bld: 115 mg/dL — ABNORMAL HIGH (ref 70–99)
Potassium: 3.6 mmol/L (ref 3.5–5.1)
Sodium: 140 mmol/L (ref 135–145)
Total Bilirubin: 0.6 mg/dL (ref 0.3–1.2)
Total Protein: 8.3 g/dL — ABNORMAL HIGH (ref 6.5–8.1)

## 2020-06-03 LAB — ETHANOL: Alcohol, Ethyl (B): <10 mg/dL (ref <10)

## 2020-06-03 LAB — CBC
HCT: 40.3 % (ref 36.0–46.0)
Hemoglobin: 12.4 g/dL (ref 12.0–15.0)
MCH: 26.1 pg (ref 26.0–34.0)
MCHC: 30.8 g/dL (ref 30.0–36.0)
MCV: 84.7 fL (ref 80.0–100.0)
Platelets: 266 10*3/uL (ref 150–400)
RBC: 4.76 MIL/uL (ref 3.87–5.11)
RDW: 15.3 % (ref 11.5–15.5)
WBC: 13.5 10*3/uL — ABNORMAL HIGH (ref 4.0–10.5)
nRBC: 0 % (ref 0.0–0.2)

## 2020-06-03 LAB — I-STAT BETA HCG BLOOD, ED (MC, WL, AP ONLY): I-stat hCG, quantitative: <5 m[IU]/mL (ref <5)

## 2020-06-03 MED ORDER — CLONIDINE HCL 0.2 MG PO TABS: 0.2000 mg | ORAL_TABLET | Freq: Two times a day (BID) | ORAL | 0 refills | Status: DC | PRN

## 2020-06-03 MED ORDER — ONDANSETRON HCL 4 MG PO TABS: 4.0000 mg | ORAL_TABLET | Freq: Four times a day (QID) | ORAL | 0 refills | Status: DC | PRN

## 2020-06-03 MED ORDER — LOPERAMIDE HCL 2 MG PO CAPS: 2.0000 mg | ORAL_CAPSULE | Freq: Four times a day (QID) | ORAL | 0 refills | Status: DC | PRN

## 2020-06-03 NOTE — ED Provider Notes (Signed)
This patient just walked out of the hospital, she did not want any further help, she is not involuntarily committed   Eber Hong, MD 06/03/20 1029

## 2020-06-03 NOTE — ED Notes (Signed)
Pt was d/c'd, states "he didn't give me any medicine to help with detox and I'm suicidal now."

## 2020-06-03 NOTE — ED Notes (Signed)
Pt walked out of facility. MD made aware. Pt is here voluntarily. Pt left parking lot.

## 2020-06-03 NOTE — ED Triage Notes (Signed)
Pt states that she is detoxing from fentanyl. Last use was x 6 hours ago.
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
## 2020-06-04 ENCOUNTER — Emergency Department (HOSPITAL_COMMUNITY)
Admission: EM | Admit: 2020-06-04 | Discharge: 2020-06-04 | Disposition: A | Discharge status: Home / Self Care | Payer: Medicaid Other | Attending: Emergency Medicine | Admitting: Emergency Medicine

## 2020-06-04 ENCOUNTER — Encounter (HOSPITAL_COMMUNITY): Payer: Self-pay

## 2020-06-04 ENCOUNTER — Other Ambulatory Visit: Payer: Self-pay

## 2020-06-04 DIAGNOSIS — Z91040 Latex allergy status: Secondary | ICD-10-CM | POA: Diagnosis not present

## 2020-06-04 DIAGNOSIS — R45851 Suicidal ideations: Secondary | ICD-10-CM | POA: Insufficient documentation

## 2020-06-04 DIAGNOSIS — F32A Depression, unspecified: Secondary | ICD-10-CM | POA: Insufficient documentation

## 2020-06-04 DIAGNOSIS — F1910 Other psychoactive substance abuse, uncomplicated: Secondary | ICD-10-CM | POA: Insufficient documentation

## 2020-06-04 DIAGNOSIS — F17210 Nicotine dependence, cigarettes, uncomplicated: Secondary | ICD-10-CM | POA: Insufficient documentation

## 2020-06-04 DIAGNOSIS — F1590 Other stimulant use, unspecified, uncomplicated: Secondary | ICD-10-CM | POA: Insufficient documentation

## 2020-06-04 LAB — SALICYLATE LEVEL: Salicylate Lvl: <7 mg/dL — ABNORMAL LOW (ref 7.0–30.0)

## 2020-06-04 LAB — CBC WITH DIFFERENTIAL/PLATELET
Abs Immature Granulocytes: 0.02 10*3/uL (ref 0.00–0.07)
Basophils Absolute: 0 10*3/uL (ref 0.0–0.1)
Basophils Relative: 0 %
Eosinophils Absolute: 0.1 10*3/uL (ref 0.0–0.5)
Eosinophils Relative: 1 %
HCT: 38.1 % (ref 36.0–46.0)
Hemoglobin: 11.8 g/dL — ABNORMAL LOW (ref 12.0–15.0)
Immature Granulocytes: 0 %
Lymphocytes Relative: 29 %
Lymphs Abs: 2.8 10*3/uL (ref 0.7–4.0)
MCH: 26.3 pg (ref 26.0–34.0)
MCHC: 31 g/dL (ref 30.0–36.0)
MCV: 84.9 fL (ref 80.0–100.0)
Monocytes Absolute: 0.2 10*3/uL (ref 0.1–1.0)
Monocytes Relative: 2 %
Neutro Abs: 6.7 10*3/uL (ref 1.7–7.7)
Neutrophils Relative %: 68 %
Platelets: 227 10*3/uL (ref 150–400)
RBC: 4.49 MIL/uL (ref 3.87–5.11)
RDW: 15.2 % (ref 11.5–15.5)
WBC: 9.9 10*3/uL (ref 4.0–10.5)
nRBC: 0 % (ref 0.0–0.2)

## 2020-06-04 LAB — COMPREHENSIVE METABOLIC PANEL
ALT: 41 U/L (ref 0–44)
AST: 19 U/L (ref 15–41)
Albumin: 3.9 g/dL (ref 3.5–5.0)
Alkaline Phosphatase: 66 U/L (ref 38–126)
Anion gap: 10 (ref 5–15)
BUN: 17 mg/dL (ref 6–20)
CO2: 27 mmol/L (ref 22–32)
Calcium: 9.2 mg/dL (ref 8.9–10.3)
Chloride: 102 mmol/L (ref 98–111)
Creatinine, Ser: 0.88 mg/dL (ref 0.44–1.00)
GFR, Estimated: >60 mL/min (ref >60)
Glucose, Bld: 160 mg/dL — ABNORMAL HIGH (ref 70–99)
Potassium: 4 mmol/L (ref 3.5–5.1)
Sodium: 139 mmol/L (ref 135–145)
Total Bilirubin: 0.5 mg/dL (ref 0.3–1.2)
Total Protein: 7.4 g/dL (ref 6.5–8.1)

## 2020-06-04 LAB — ACETAMINOPHEN LEVEL: Acetaminophen (Tylenol), Serum: <10 ug/mL — ABNORMAL LOW (ref 10–30)

## 2020-06-04 LAB — ETHANOL: Alcohol, Ethyl (B): <10 mg/dL (ref <10)

## 2020-06-04 NOTE — ED Notes (Signed)
Pt will not give [place] a urine at this time.

## 2020-06-04 NOTE — ED Notes (Signed)
Pt left AMA at 12:10pm. Dr Hyacinth Meeker was informed. Patient left with belongings.

## 2020-06-04 NOTE — ED Triage Notes (Signed)
Pt states she is here for suicidal thoughts and detox from xanax, cocaine, fentanyl. When asked what her plan is for suicide pt states "I just need detox."  When asked the SI questions, pt became tearful and crying and states she tried to overdose yesterday on fentanyl after she left yesterday.
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
## 2020-07-14 ENCOUNTER — Other Ambulatory Visit: Payer: Self-pay

## 2020-07-14 ENCOUNTER — Encounter (HOSPITAL_COMMUNITY): Payer: Self-pay | Admitting: Emergency Medicine

## 2020-07-14 ENCOUNTER — Emergency Department (HOSPITAL_COMMUNITY)
Admission: EM | Admit: 2020-07-14 | Discharge: 2020-07-14 | Disposition: A | Discharge status: Home / Self Care | Payer: Medicaid Other | Attending: Emergency Medicine | Admitting: Emergency Medicine

## 2020-07-14 DIAGNOSIS — F17210 Nicotine dependence, cigarettes, uncomplicated: Secondary | ICD-10-CM | POA: Insufficient documentation

## 2020-07-14 DIAGNOSIS — N390 Urinary tract infection, site not specified: Secondary | ICD-10-CM | POA: Insufficient documentation

## 2020-07-14 DIAGNOSIS — Z79899 Other long term (current) drug therapy: Secondary | ICD-10-CM | POA: Insufficient documentation

## 2020-07-14 DIAGNOSIS — Z91040 Latex allergy status: Secondary | ICD-10-CM | POA: Diagnosis not present

## 2020-07-14 DIAGNOSIS — R319 Hematuria, unspecified: Secondary | ICD-10-CM

## 2020-07-14 DIAGNOSIS — M549 Dorsalgia, unspecified: Secondary | ICD-10-CM | POA: Diagnosis present

## 2020-07-14 LAB — URINALYSIS, ROUTINE W REFLEX MICROSCOPIC
Bilirubin Urine: NEGATIVE
Glucose, UA: NEGATIVE mg/dL
Hgb urine dipstick: NEGATIVE
Ketones, ur: NEGATIVE mg/dL
Nitrite: NEGATIVE
Protein, ur: NEGATIVE mg/dL
Specific Gravity, Urine: 1.008 (ref 1.005–1.030)
pH: 6 (ref 5.0–8.0)

## 2020-07-14 LAB — PREGNANCY, URINE: Preg Test, Ur: NEGATIVE

## 2020-07-14 MED ORDER — CEPHALEXIN 500 MG PO CAPS: 500.0000 mg | ORAL_CAPSULE | Freq: Four times a day (QID) | ORAL | 0 refills | Status: AC

## 2020-07-14 MED ORDER — CEPHALEXIN 500 MG PO CAPS
500.0000 mg | ORAL_CAPSULE | Freq: Once | ORAL | Status: AC
  Administered 2020-07-14: 500 mg via ORAL
  Filled 2020-07-14: qty 1

## 2020-07-14 NOTE — ED Notes (Signed)
Pt now reports here for   "my kidney"  Reports px x 1 month  No follow with Dr Margo Aye PCP  Here for eval

## 2020-07-14 NOTE — ED Notes (Signed)
Hx chronic back pain   Here for eval of lower back pain

## 2020-07-14 NOTE — ED Triage Notes (Signed)
Pt c/o lower back pain x1 month. Pt has not seen her PCP. Pt denies urinary sx.

## 2020-07-14 NOTE — Discharge Instructions (Signed)
Return if any problems.
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
## 2020-10-18 ENCOUNTER — Other Ambulatory Visit: Payer: Self-pay

## 2020-10-18 ENCOUNTER — Emergency Department (HOSPITAL_COMMUNITY)
Admission: EM | Admit: 2020-10-18 | Discharge: 2020-10-19 | Disposition: A | Discharge status: Home / Self Care | Payer: Medicaid Other | Attending: Emergency Medicine | Admitting: Emergency Medicine

## 2020-10-18 ENCOUNTER — Encounter (HOSPITAL_COMMUNITY): Payer: Self-pay | Admitting: Emergency Medicine

## 2020-10-18 DIAGNOSIS — M545 Low back pain, unspecified: Secondary | ICD-10-CM | POA: Diagnosis not present

## 2020-10-18 DIAGNOSIS — R030 Elevated blood-pressure reading, without diagnosis of hypertension: Secondary | ICD-10-CM | POA: Diagnosis not present

## 2020-10-18 DIAGNOSIS — Z91040 Latex allergy status: Secondary | ICD-10-CM | POA: Insufficient documentation

## 2020-10-18 DIAGNOSIS — B9689 Other specified bacterial agents as the cause of diseases classified elsewhere: Secondary | ICD-10-CM | POA: Diagnosis not present

## 2020-10-18 DIAGNOSIS — Z202 Contact with and (suspected) exposure to infections with a predominantly sexual mode of transmission: Secondary | ICD-10-CM | POA: Insufficient documentation

## 2020-10-18 DIAGNOSIS — N760 Acute vaginitis: Secondary | ICD-10-CM | POA: Insufficient documentation

## 2020-10-18 DIAGNOSIS — F17210 Nicotine dependence, cigarettes, uncomplicated: Secondary | ICD-10-CM | POA: Diagnosis not present

## 2020-10-18 LAB — URINALYSIS, ROUTINE W REFLEX MICROSCOPIC
Bacteria, UA: NONE SEEN
Bilirubin Urine: NEGATIVE
Glucose, UA: NEGATIVE mg/dL
Hgb urine dipstick: NEGATIVE
Ketones, ur: NEGATIVE mg/dL
Leukocytes,Ua: NEGATIVE
Nitrite: NEGATIVE
Protein, ur: 30 mg/dL — AB
Specific Gravity, Urine: 1.03 (ref 1.005–1.030)
pH: 5 (ref 5.0–8.0)

## 2020-10-18 LAB — PREGNANCY, URINE: Preg Test, Ur: NEGATIVE

## 2020-10-18 LAB — WET PREP, GENITAL
Sperm: NONE SEEN
Trich, Wet Prep: NONE SEEN
Yeast Wet Prep HPF POC: NONE SEEN

## 2020-10-18 MED ORDER — DOXYCYCLINE HYCLATE 100 MG PO CAPS: 100.0000 mg | ORAL_CAPSULE | Freq: Two times a day (BID) | ORAL | 0 refills | Status: DC

## 2020-10-18 MED ORDER — DOXYCYCLINE HYCLATE 100 MG PO TABS
100.0000 mg | ORAL_TABLET | Freq: Once | ORAL | Status: AC
  Administered 2020-10-18: 100 mg via ORAL
  Filled 2020-10-18: qty 1

## 2020-10-18 MED ORDER — STERILE WATER FOR INJECTION IJ SOLN
INTRAMUSCULAR | Status: AC
  Administered 2020-10-18: 1 mL
  Filled 2020-10-18: qty 10

## 2020-10-18 MED ORDER — CEFTRIAXONE SODIUM 500 MG IJ SOLR
500.0000 mg | Freq: Once | INTRAMUSCULAR | Status: AC
  Administered 2020-10-18: 500 mg via INTRAMUSCULAR
  Filled 2020-10-18: qty 500

## 2020-10-18 NOTE — ED Triage Notes (Signed)
Pt was diagnosed with UTI and kidney infection x 1.5 weeks ago. States that she thinks that it is a STD and wants to be checked.
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
## 2020-10-19 LAB — GC/CHLAMYDIA PROBE AMP (~~LOC~~) NOT AT ARMC
Chlamydia: NEGATIVE
Comment: NEGATIVE
Comment: NORMAL
Neisseria Gonorrhea: NEGATIVE

## 2020-10-19 MED ORDER — FLUCONAZOLE 150 MG PO TABS: 150.0000 mg | ORAL_TABLET | Freq: Every day | ORAL | 0 refills | Status: DC

## 2020-10-19 MED ORDER — METRONIDAZOLE 500 MG PO TABS
500.0000 mg | ORAL_TABLET | Freq: Once | ORAL | Status: AC
  Administered 2020-10-19: 500 mg via ORAL
  Filled 2020-10-19: qty 1

## 2020-10-19 MED ORDER — METRONIDAZOLE 500 MG PO TABS: 500.0000 mg | ORAL_TABLET | Freq: Two times a day (BID) | ORAL | 0 refills | Status: DC

## 2020-10-19 NOTE — Discharge Instructions (Signed)
Please use look for your culture results on MyChart.  Make sure that all sexual contacts are evaluated and treated.  Your blood pressure was slightly elevated today.  Please have it rechecked several times over the next week.  If it continues to be elevated, you may need to be on medication to control it.  Untreated high blood pressure can lead to heart attacks, strokes, kidney failure.

## 2020-10-23 ENCOUNTER — Encounter (HOSPITAL_COMMUNITY): Payer: Self-pay | Admitting: Emergency Medicine

## 2020-10-23 ENCOUNTER — Other Ambulatory Visit: Payer: Self-pay

## 2020-10-23 ENCOUNTER — Emergency Department (HOSPITAL_COMMUNITY)
Admission: EM | Admit: 2020-10-23 | Discharge: 2020-10-23 | Disposition: A | Discharge status: Home / Self Care | Payer: Medicaid Other | Attending: Emergency Medicine | Admitting: Emergency Medicine

## 2020-10-23 DIAGNOSIS — Z91040 Latex allergy status: Secondary | ICD-10-CM | POA: Insufficient documentation

## 2020-10-23 DIAGNOSIS — H538 Other visual disturbances: Secondary | ICD-10-CM | POA: Insufficient documentation

## 2020-10-23 DIAGNOSIS — B353 Tinea pedis: Secondary | ICD-10-CM | POA: Insufficient documentation

## 2020-10-23 DIAGNOSIS — R42 Dizziness and giddiness: Secondary | ICD-10-CM | POA: Diagnosis present

## 2020-10-23 DIAGNOSIS — F17210 Nicotine dependence, cigarettes, uncomplicated: Secondary | ICD-10-CM | POA: Insufficient documentation

## 2020-10-23 LAB — CBC WITH DIFFERENTIAL/PLATELET
Abs Immature Granulocytes: 0.02 10*3/uL (ref 0.00–0.07)
Basophils Absolute: 0 10*3/uL (ref 0.0–0.1)
Basophils Relative: 0 %
Eosinophils Absolute: 0.2 10*3/uL (ref 0.0–0.5)
Eosinophils Relative: 2 %
HCT: 39 % (ref 36.0–46.0)
Hemoglobin: 12.7 g/dL (ref 12.0–15.0)
Immature Granulocytes: 0 %
Lymphocytes Relative: 43 %
Lymphs Abs: 4.4 10*3/uL — ABNORMAL HIGH (ref 0.7–4.0)
MCH: 29 pg (ref 26.0–34.0)
MCHC: 32.6 g/dL (ref 30.0–36.0)
MCV: 89 fL (ref 80.0–100.0)
Monocytes Absolute: 0.5 10*3/uL (ref 0.1–1.0)
Monocytes Relative: 5 %
Neutro Abs: 5.2 10*3/uL (ref 1.7–7.7)
Neutrophils Relative %: 50 %
Platelets: 265 10*3/uL (ref 150–400)
RBC: 4.38 MIL/uL (ref 3.87–5.11)
RDW: 14.1 % (ref 11.5–15.5)
WBC: 10.4 10*3/uL (ref 4.0–10.5)
nRBC: 0 % (ref 0.0–0.2)

## 2020-10-23 LAB — COMPREHENSIVE METABOLIC PANEL
ALT: 31 U/L (ref 0–44)
AST: 34 U/L (ref 15–41)
Albumin: 4.5 g/dL (ref 3.5–5.0)
Alkaline Phosphatase: 63 U/L (ref 38–126)
Anion gap: 11 (ref 5–15)
BUN: 10 mg/dL (ref 6–20)
CO2: 28 mmol/L (ref 22–32)
Calcium: 9.4 mg/dL (ref 8.9–10.3)
Chloride: 100 mmol/L (ref 98–111)
Creatinine, Ser: 0.86 mg/dL (ref 0.44–1.00)
GFR, Estimated: >60 mL/min (ref >60)
Glucose, Bld: 105 mg/dL — ABNORMAL HIGH (ref 70–99)
Potassium: 3.3 mmol/L — ABNORMAL LOW (ref 3.5–5.1)
Sodium: 139 mmol/L (ref 135–145)
Total Bilirubin: 0.5 mg/dL (ref 0.3–1.2)
Total Protein: 8.3 g/dL — ABNORMAL HIGH (ref 6.5–8.1)

## 2020-10-23 LAB — PREGNANCY, URINE: Preg Test, Ur: NEGATIVE

## 2020-10-23 MED ORDER — MECLIZINE HCL 12.5 MG PO TABS
25.0000 mg | ORAL_TABLET | Freq: Once | ORAL | Status: AC
  Administered 2020-10-23: 25 mg via ORAL
  Filled 2020-10-23: qty 2

## 2020-10-23 MED ORDER — MECLIZINE HCL 25 MG PO TABS: 25.0000 mg | ORAL_TABLET | Freq: Three times a day (TID) | ORAL | 0 refills | Status: DC | PRN

## 2020-10-23 MED ORDER — CLOTRIMAZOLE 1 % EX CREA: TOPICAL_CREAM | CUTANEOUS | 0 refills | Status: DC

## 2020-10-23 NOTE — ED Notes (Signed)
Pt given sprite to drink. 
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
## 2020-10-23 NOTE — Discharge Instructions (Signed)
You were seen in the emerge department today with dizziness.  I believe this is coming from an issue with your inner ear.  I have called in a prescription medication to help with symptoms which you take only when you are having symptoms.  The rash to the feet is likely athlete's foot and have called in a cream to take for the next 4 weeks.  Your gonorrhea and Chlamydia test came back negative from your last ED visit.  You do not need to take the doxycycline prescribed at the last ED visit but you should fill the Flagyl for the bacterial vaginosis if you are still having symptoms.   Please call your primary care doctor to schedule a follow-up appointment.  Return to the emergency department any new or suddenly worsening symptoms.

## 2020-10-23 NOTE — ED Triage Notes (Signed)
Pt states that she has been dizzy with blurred vision x 5 hours. Pt ambulatory to triage with steady gate. Pt falling asleep during triage.

## 2020-10-24 LAB — HIV ANTIBODY (ROUTINE TESTING W REFLEX): HIV Screen 4th Generation wRfx: NONREACTIVE

## 2020-12-21 ENCOUNTER — Emergency Department (HOSPITAL_COMMUNITY): Payer: Medicaid Other

## 2020-12-21 ENCOUNTER — Encounter (HOSPITAL_COMMUNITY): Payer: Self-pay

## 2020-12-21 ENCOUNTER — Other Ambulatory Visit: Payer: Self-pay

## 2020-12-21 ENCOUNTER — Emergency Department (HOSPITAL_COMMUNITY)
Admission: EM | Admit: 2020-12-21 | Discharge: 2020-12-22 | Disposition: A | Discharge status: Home / Self Care | Payer: Medicaid Other | Attending: Emergency Medicine | Admitting: Emergency Medicine

## 2020-12-21 DIAGNOSIS — R079 Chest pain, unspecified: Secondary | ICD-10-CM | POA: Diagnosis not present

## 2020-12-21 DIAGNOSIS — Z91040 Latex allergy status: Secondary | ICD-10-CM | POA: Insufficient documentation

## 2020-12-21 DIAGNOSIS — R000 Tachycardia, unspecified: Secondary | ICD-10-CM | POA: Diagnosis not present

## 2020-12-21 DIAGNOSIS — F1590 Other stimulant use, unspecified, uncomplicated: Secondary | ICD-10-CM | POA: Diagnosis not present

## 2020-12-21 DIAGNOSIS — F17210 Nicotine dependence, cigarettes, uncomplicated: Secondary | ICD-10-CM | POA: Insufficient documentation

## 2020-12-21 DIAGNOSIS — B9689 Other specified bacterial agents as the cause of diseases classified elsewhere: Secondary | ICD-10-CM | POA: Insufficient documentation

## 2020-12-21 DIAGNOSIS — N3001 Acute cystitis with hematuria: Secondary | ICD-10-CM | POA: Insufficient documentation

## 2020-12-21 DIAGNOSIS — F1510 Other stimulant abuse, uncomplicated: Secondary | ICD-10-CM

## 2020-12-21 NOTE — ED Notes (Signed)
ED Provider at bedside. 
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
## 2020-12-22 LAB — URINALYSIS, ROUTINE W REFLEX MICROSCOPIC
Bilirubin Urine: NEGATIVE
Glucose, UA: NEGATIVE mg/dL
Ketones, ur: 20 mg/dL — AB
Nitrite: POSITIVE — AB
Protein, ur: 100 mg/dL — AB
Specific Gravity, Urine: 1.015 (ref 1.005–1.030)
WBC, UA: >50 WBC/hpf — ABNORMAL HIGH (ref 0–5)
pH: 5 (ref 5.0–8.0)

## 2020-12-22 LAB — PREGNANCY, URINE: Preg Test, Ur: NEGATIVE

## 2020-12-22 MED ORDER — CEPHALEXIN 500 MG PO CAPS: 500.0000 mg | ORAL_CAPSULE | Freq: Four times a day (QID) | ORAL | 0 refills | Status: DC

## 2020-12-22 MED ORDER — CEPHALEXIN 500 MG PO CAPS
1000.0000 mg | ORAL_CAPSULE | Freq: Once | ORAL | Status: AC
  Administered 2020-12-22: 1000 mg via ORAL
  Filled 2020-12-22: qty 2

## 2020-12-22 NOTE — ED Notes (Signed)
Pt returned from X Ray.

## 2020-12-22 NOTE — ED Notes (Signed)
Patient transported to X-ray 
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
## 2021-02-25 ENCOUNTER — Other Ambulatory Visit: Payer: Self-pay

## 2021-02-25 ENCOUNTER — Emergency Department (HOSPITAL_COMMUNITY)
Admission: EM | Admit: 2021-02-25 | Discharge: 2021-02-26 | Disposition: A | Discharge status: Home / Self Care | Payer: Medicaid Other | Attending: Emergency Medicine | Admitting: Emergency Medicine

## 2021-02-25 ENCOUNTER — Encounter (HOSPITAL_COMMUNITY): Payer: Self-pay | Admitting: *Deleted

## 2021-02-25 DIAGNOSIS — F17210 Nicotine dependence, cigarettes, uncomplicated: Secondary | ICD-10-CM | POA: Insufficient documentation

## 2021-02-25 DIAGNOSIS — N12 Tubulo-interstitial nephritis, not specified as acute or chronic: Secondary | ICD-10-CM | POA: Insufficient documentation

## 2021-02-25 DIAGNOSIS — Z91040 Latex allergy status: Secondary | ICD-10-CM | POA: Diagnosis not present

## 2021-02-25 DIAGNOSIS — R1010 Upper abdominal pain, unspecified: Secondary | ICD-10-CM | POA: Diagnosis present

## 2021-02-25 LAB — COMPREHENSIVE METABOLIC PANEL
ALT: 27 U/L (ref 0–44)
AST: 20 U/L (ref 15–41)
Albumin: 3.2 g/dL — ABNORMAL LOW (ref 3.5–5.0)
Alkaline Phosphatase: 102 U/L (ref 38–126)
Anion gap: 8 (ref 5–15)
BUN: 13 mg/dL (ref 6–20)
CO2: 28 mmol/L (ref 22–32)
Calcium: 7.9 mg/dL — ABNORMAL LOW (ref 8.9–10.3)
Chloride: 91 mmol/L — ABNORMAL LOW (ref 98–111)
Creatinine, Ser: 0.84 mg/dL (ref 0.44–1.00)
GFR, Estimated: >60 mL/min (ref >60)
Glucose, Bld: 111 mg/dL — ABNORMAL HIGH (ref 70–99)
Potassium: 3.4 mmol/L — ABNORMAL LOW (ref 3.5–5.1)
Sodium: 127 mmol/L — ABNORMAL LOW (ref 135–145)
Total Bilirubin: 0.8 mg/dL (ref 0.3–1.2)
Total Protein: 7.8 g/dL (ref 6.5–8.1)

## 2021-02-25 LAB — URINALYSIS, ROUTINE W REFLEX MICROSCOPIC
Bilirubin Urine: NEGATIVE
Glucose, UA: NEGATIVE mg/dL
Ketones, ur: NEGATIVE mg/dL
Nitrite: NEGATIVE
Protein, ur: 100 mg/dL — AB
Specific Gravity, Urine: 1.026 (ref 1.005–1.030)
WBC, UA: >50 WBC/hpf — ABNORMAL HIGH (ref 0–5)
pH: 5 (ref 5.0–8.0)

## 2021-02-25 LAB — LIPASE, BLOOD: Lipase: 25 U/L (ref 11–51)

## 2021-02-25 LAB — CBC
HCT: 37.4 % (ref 36.0–46.0)
Hemoglobin: 12.2 g/dL (ref 12.0–15.0)
MCH: 26.3 pg (ref 26.0–34.0)
MCHC: 32.6 g/dL (ref 30.0–36.0)
MCV: 80.6 fL (ref 80.0–100.0)
Platelets: 228 10*3/uL (ref 150–400)
RBC: 4.64 MIL/uL (ref 3.87–5.11)
RDW: 14.9 % (ref 11.5–15.5)
WBC: 25.1 10*3/uL — ABNORMAL HIGH (ref 4.0–10.5)
nRBC: 0 % (ref 0.0–0.2)

## 2021-02-25 LAB — POC URINE PREG, ED: Preg Test, Ur: NEGATIVE

## 2021-02-25 MED ORDER — SODIUM CHLORIDE 0.9 % IV SOLN
1.0000 g | Freq: Once | INTRAVENOUS | Status: AC
  Administered 2021-02-25: 1 g via INTRAVENOUS
  Filled 2021-02-25: qty 10

## 2021-02-25 MED ORDER — SODIUM CHLORIDE 0.9 % IV BOLUS
1000.0000 mL | Freq: Once | INTRAVENOUS | Status: AC
  Administered 2021-02-25: 1000 mL via INTRAVENOUS

## 2021-02-25 MED ORDER — CEPHALEXIN 500 MG PO CAPS
500.0000 mg | ORAL_CAPSULE | Freq: Four times a day (QID) | ORAL | 0 refills | Status: DC
Start: 2021-02-25 — End: 2023-08-04

## 2021-02-25 NOTE — ED Triage Notes (Signed)
Pt with burning with urination x a week with N/V.  + abd pain.
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
## 2021-02-25 NOTE — Discharge Instructions (Addendum)
It appears that you have a kidney infection, that can be treated with antibiotics.  Start with a clear liquid diet and gradually advance to regular food after you feel better.  Start taking the antibiotic prescription which was sent to your pharmacy tomorrow.  The urine culture results should return within 2 or 3 days.  You can check them on MyChart.  Use Tylenol for pain or fever.  See your doctor for a checkup in a week or 2.

## 2021-02-27 LAB — URINE CULTURE: Culture: 10000 — AB

## 2021-11-20 NOTE — Discharge Summary (Addendum)
 Psychiatry Discharge Summary   Admit date: 11/16/2021  8:04 PM  Discharge date and time: 11/20/2021 11:20 AM  Discharge Physician: Powell Abigail Berg, MD  Time Spent on Discharge Summary: [facility] Course   Abigail [name] is a 31 y.o. with a psychiatric history significant for OUD who is being admitted for opioid detox.  Reports seizures when detoxing with last one (and only one) on 04/16/21 - throwing up while in jail and states she started seizing. Denies she was withdrawing from alcohol or benzodiazepines.   She states she was living with her boyfriend's parents but they kicked her out when they found out she was using substances.  She reports she broke up with her boyfriend in order to go to rehab because she did not think he would go.   Before this interviewer entered the patient's room, she was talking to people not present.  When asked if she has hallucinations, she admits to hearing voices.  She states she has had voices for about 3 years and she hears them even when she was sober for 2 months.  She states that some of the people she doesn't like and some of them she does.  She reports forces want [her] dead but would not elaborate.  However, she does not want the voices to go away.  She states that she does not want to take medications to lessen them in anyway.  She is distracted occasionally during our interview and laughs on occasion while looking to the side of the interviewer - responding to internal stimuli.  Encouraged her to consider starting medications so that she is able to better participate in rehab and to help mood as she reports depression - she declines.   Patient also reports back and neck pain that is chronic. She has not obtained treatment for this pain.   On 11/16/2021  8:04 PM this patient was voluntarily admitted to the psychiatric unit and placed on elopement precautions. The patient was seen daily and case discussed in a multidisciplinary  team meeting.  - The following home medications were continued: None - The following home medications were not continued: None - The following new medications were initiated:  - 4/9: Flagyl  for Bacteria Vaginosis - 500 mg BID to end on 4/15 at 2000 Subutex  taper to end on 4/13 Bactrim DS BID for cellulitis at injection sites on bilateral AC to end on 4/15 at 2100 4/10: Will recommend patient start on Abilify 10 mg daily - she is currently refusing but will encourage again tomorrow.   Labs and imaging ordered: - CMP - elevated Alk Phos & elevated ALT; GGT elevated at 72 - CBC: low MCV, low hemoglobin;  - TSH: ordered - Hemoglobin A1c: ordered  - EKG (QTC): ordered - UA:+ clue cells in wet prep. Neg for GC/Chlamydia;  UA unremarkable.  - RPR neg.  - HIV neg; - Alcohol on arrival < 10 - UDS: +fentanyl ; + opiates; + amphetamines.  - Imaging: not required.    During the course of the hospitalization, patient frequently sought medications for nausea but did not take buprenorphine  as instructed to assist with withdrawals.  She also missed doses of antibiotics due to feeling nauseated per her report. Clonidine  was offered as blood pressure would tolerate.  Patient did not take antipsychotics and was informed that the rehab facilities would not accept a patient unless they have demonstrated 30 days of antipsychotic compliance for schizophrenia.  Patient did not attend groups regularly.  On 4/12, patient told nursing that she would hurt herself if she had to leave today because she was not finished her withdrawals.  She also stated she did not want to have to get up to get her medications and wanted them brought to her in bed.  I discussed with patient that we can increase buprenorphine  to help with the withdrawals but she would still likely be discharged tomorrow for outpatient management.  She agreed to take her antibiotic, start antipsychotic, and take her buprenorphine  today.   After  provider left the room, about 30 min later, she stated she wanted to sign out AMA.  She states if she is only going to be able to stay 1 day, she would rather return home - to her mother's house.   She states her mother is willing to pick her up today. She declines her antibiotics, buprenorphine , or antipsychotic this am.  She states she was lying about wanting to hurt herself.  At time of discharge, patient denies SI, HI, AVH.  Risks of leaving AMA with her conditions untreated was discussed.  She did provide a pharmacy so that medications could be sent into the pharmacy.   On 11/20/2021 11:20 AM the treatment team decided to discharge Abigail [name] home with scheduled outpatient mental health follow up appointments. Risks, benefits and side effects of medications were discussed in detail prior to discharge. Drue Camera was duly informed to call 911 or go to the nearest emergency department if patient becomes overtly distressed or develops suicidal, homicidal or other violent ideation and they verbalized understanding.   Follow-up    .    Verify Patient Has Pcp .   Specialty: General Practice             Discharge Follow-Up Plan:  Clinical Follow-up [facility] and/or SA Services):  Name of Provider Agency Referred: [facility] [place]   Date/Time of Appointment: Walk-in Monday-Friday 8:00AM-2:30PM Phone Number: [phone] Address: [address] [place], [place] [address]   Reason: MEDICATION MANAGEMENT   Transportation: friend   **Please bring photo ID, social security card, insurance card, or proof of household income if they do not have insurance.**   Crisis Support Line: [phone]  Discharge Screening   Multiple Antipsychotics:   N/A  FDA-approved cessation medication prescribed at discharge: Nicotine  Patch Note: if patient is heavy user, (average volume of five or more cigarettes per day and/or cigars daily and/or pipes daily during the past 30 days), the patient is required  to have FDA-approved cessation medication (nicotine  patch/gum, bupropion, varenicline etc),  or a documented reason for not prescribing it.             Labs/Imaging During Admission      Results for orders placed or performed during the hospital encounter of 11/16/21  Wet Preparation   Specimen: Swab  Result Value Ref Range   WBC Rare Negative, Rare   Clue Cells Positive (A) Negative   Yeast Negative Negative   Trichomonas Negative Negative  Comprehensive Metabolic Panel  Result Value Ref Range   Sodium 136 135 - 146 MMOL/L   Potassium 3.6 3.5 - 5.3 MMOL/L   Chloride 103 98 - 110 MMOL/L   CO2 26 23 - 30 MMOL/L   BUN 19 8 - 24 MG/DL   Glucose 96 70 - 99 MG/DL   Creatinine 9.20 9.49 - 1.50 MG/DL   Calcium 9.4 8.5 - 89.4 MG/DL   Total Protein 9.1 (H) 6.0 - 8.3 G/DL   Albumin  4.1  3.5 - 5.0 G/DL   Total Bilirubin 0.3 0.1 - 1.2 MG/DL   Alkaline Phosphatase 179 (H) 25 - 125 IU/L or U/L   AST (SGOT) 34 5 - 40 IU/L or U/L   ALT (SGPT) 77 (H) 5 - 50 IU/L or U/L   Anion Gap 7 4 - 14 MMOL/L   Est. GFR >90 >=60 ML/MIN/1.73 M*2   Ethanol  Result Value Ref Range   Alcohol <10 <=10 MG/DL  Urine Drug Screen  Result Value Ref Range   Amphetamines Positive (A) Negative, Indeterminate   Barbiturates Negative Negative, Indeterminate   Benzodiazepines Negative Negative, Indeterminate   Cocaine Negative Negative, Indeterminate   Opiates Positive (A) Negative, Indeterminate   Oxycodone  Negative Negative, Indeterminate   THC Negative Negative, Indeterminate   Methadone  Negative Negative, Indeterminate   U Creatinine Concentrate 279 MG/DL  Urinalysis With Microscopic  Result Value Ref Range   Urine Color Yellow Colorless, Yellow   Urine Appearance Clear Clear   Urine Specific Gravity 1.031 (H) 1.005 - 1.025   Urine pH 5.5 4.6 - 8.0   Urine Protein 30 (A) Negative MG/DL   Urine Glucose Negative Negative, Normal, 30 , 50  MG/DL   Urine Ketone Negative Negative MG/DL   Urine Bilirubin  Negative Negative   Urine Blood/Hb 1+ (A) Negative   Urine Urobilinogen 0.2 0.2 - 1.0 EU/DL   Urine Nitrite Negative Negative   Urine Leukocyte Esterase Negative Negative   Urine WBC 5 0 - 5 /HPF   Urine RBC 5 (H) 0 - 3 /HPF   Urine Squamous Epithelial Cells Few Few /HPF  Hold For Urine Culture  Result Value Ref Range   CULTURE,URINE HOLD SAMPLE Culture tube received   GC/Chlamydia Amp   Specimen: Swab  Result Value Ref Range   CHLAMYDIA AMPLIFICAT Not Detected Not Detected   GC AMPLIFICATION Not Detected Not Detected   CHLAMYDIA/GC COMMENT     CHLAM/ GC SPEC DESC Swab   Fentanyl  Screen, Urine  Result Value Ref Range   Fentanyl  Positive (A) Negative  RPR (Syphilis Serology)  Result Value Ref Range   RPR Nonreactive Nonreactive  HIV Antigen Antibody Combo  Result Value Ref Range   HIV Antigen Antibody Combo Non-Reactive Non-Reactive  COVID Antigen   Specimen: Nasal Swab  Result Value Ref Range   COVID ANTIGEN Negative Negative  Gamma GT  Result Value Ref Range   GGT 72 (H) 9 - 64 IU/L or U/L  TSH, 3rd Generation  Result Value Ref Range   TSH 1.85 0.45 - 5.00 UIU/ML  Lipid Profile  Result Value Ref Range   Total Cholesterol 116 25 - 199 MG/DL   Triglycerides 889 10 - 150 MG/DL   HDL Cholesterol 22 (L) 35 - 135 MG/DL   LDL Cholesterol Calculated 72 0 - 99 MG/DL   Total Chol / HDL Cholesterol 5.3 (H) <4.5   Non-HDL Cholesterol 94 MG/DL   Coronary Heart Disease Risk Table    Hemoglobin A1C  Result Value Ref Range   HEMOGLOBIN A1C 5.6 <5.7 %   eAG 114 MG/DL   eAG Comment    POCT Pregnancy, Urine  Result Value Ref Range   PREGNANCY TEST URINE, POC Negative Reference Range:  Negative   QC VERIFIED AND ACCEPTABLE Yes    LOT NUMBER [id number]    Exp. Date 24/3/31   CBC and Differential  Result Value Ref Range   WBC 9.8 4.4 - 11.0 x 10*3/uL   RBC 4.57 4.10 - 5.10  x 10*6/uL   Hemoglobin 10.1 (L) 12.3 - 15.3 G/DL   Hematocrit 68.1 (L) 64.0 - 44.6 %   MCV 69.6 (L) 80.0 -  96.0 FL   MCH 22.0 (L) 27.5 - 33.2 PG   MCHC 31.6 (L) 33.0 - 37.0 G/DL   RDW 83.4 87.6 - 82.9 %   Platelets 266 150 - 450 X 10*3/uL   MPV 7.6 6.8 - 10.2 FL   Neutrophil % 62 %   Lymphocyte % 32 %   Monocyte % 5 %   Eosinophil % 1 %   Basophil % 1 %   Neutrophil Absolute 6.1 1.8 - 7.8 x 10*3/uL   Lymphocyte Absolute 3.1 1.0 - 4.8 x 10*3/uL   Monocyte Absolute 0.5 0.0 - 0.8 x 10*3/uL   Eosinophil Absolute 0.1 0.0 - 0.5 x 10*3/uL   Basophil Absolute 0.0 0.0 - 0.2 x 10*3/uL  Technologist Smear Review  Result Value Ref Range   WBC Morphology      Discharge Mental Status Examination   General Appearance overweight, appears stated age and hygiene appropriate  General Behavior impulsive and appropriate eye contact  Psychomotor Activity normoactive  Gait and Station no gait abnormalities  Speech   normal rate, fluent, normal volume, normal tone, normal prosody and normal amount  Mood   ill  Affect    anxious, irritable and labile  Thought Process linear/organized and goal directed  Associations Intact  Thought Content/Perceptual Disturbances Endorses auditory hallucinations.  Denies SI/HI  Cognition/Sensorium  orientation grossly intact, memory grossly intact, attention intact, language normal and fund of knowledge grossly intact  Insight  poor  Judgment poor    Discharge Diagnoses   Psychiatric Diagnoses: OUD, severe Schizophrenia, paranoid.   Medical Diagnoses: History reviewed. No pertinent past medical history.  Psychosocial and contextual factors: Risk Factors: lack of treatment-seeking behavior, poor reality testing, economic stressors, housing problems and problems with access to health care services Protective Factors: social supports/connections  C-SSRS ([place] Suicide Severity Risk Scale and Protective Factors)   Suicide Risk Assessment (in the last month) 1. Have you wished you were dead or wished you could go to sleep and not wake up? (Past 1 Month):  No 2. Have you actually had any thoughts of killing yourself? (Past 1 Month): No       Suicide Risk Assessment (in your lifetime) 6. Have you ever done anything, started to do anything, or prepared to do anything to end your life? (Lifetime): No   C-SSRS Risk Score (Last Month/Lifetime): No risk Identify Risk Factors           Identify Protective Factors     Specific questioning about Thoughts, Plans, and Suicidal Intent             Final Determination    Patient Instructions     .    START taking these medications   metroNIDAZOLE  500 MG tablet Commonly known as: FLAGYL  Dose: 500 mg Instructions: Take 1 tablet (500 mg total) by mouth every 12 hours for 7 doses.   nicotine  21 mg/24 hr Commonly known as: NICODERM CQ  Dose: 1 patch Instructions: Place 1 patch onto the skin daily. START Date: November 21, 2021   sulfamethoxazole-trimethoprim 800-160 mg per tablet Commonly known as: BACTRIM DS Dose: 1 tablet Instructions: Take 1 tablet by mouth 2 times daily for 7 doses.   traZODone  100 MG tablet Commonly known as: DESYREL  Dose: 100 mg Instructions: Take 1 tablet (100 mg total) by mouth nightly as needed for  Sleep. Indication: insomnia associated with depression         Powell Abigail Berg, MD 11/20/2021  [facility] Department of Psychiatry & Behavioral Health       Electronically signed by: Powell Abigail Berg, MD 11/20/21 1144    Electronically signed by: Powell Abigail Berg, MD 11/20/21 1145    Electronically signed by: Powell Abigail Berg, MD 11/20/21 1145

## 2022-02-19 ENCOUNTER — Other Ambulatory Visit: Payer: Self-pay

## 2022-02-19 ENCOUNTER — Encounter (HOSPITAL_COMMUNITY): Payer: Self-pay | Admitting: Emergency Medicine

## 2022-02-19 ENCOUNTER — Emergency Department (HOSPITAL_COMMUNITY)
Admission: EM | Admit: 2022-02-19 | Discharge: 2022-02-19 | Disposition: A | Discharge status: Home / Self Care | Payer: Medicaid Other | Attending: Emergency Medicine | Admitting: Emergency Medicine

## 2022-02-19 DIAGNOSIS — R300 Dysuria: Secondary | ICD-10-CM | POA: Insufficient documentation

## 2022-02-19 DIAGNOSIS — R000 Tachycardia, unspecified: Secondary | ICD-10-CM | POA: Diagnosis not present

## 2022-02-19 DIAGNOSIS — Z91040 Latex allergy status: Secondary | ICD-10-CM | POA: Insufficient documentation

## 2022-02-19 DIAGNOSIS — M545 Low back pain, unspecified: Secondary | ICD-10-CM | POA: Diagnosis present

## 2022-02-19 LAB — URINALYSIS, ROUTINE W REFLEX MICROSCOPIC
Bacteria, UA: NONE SEEN
Bilirubin Urine: NEGATIVE
Glucose, UA: NEGATIVE mg/dL
Hgb urine dipstick: NEGATIVE
Ketones, ur: 20 mg/dL — AB
Leukocytes,Ua: NEGATIVE
Nitrite: NEGATIVE
Protein, ur: 100 mg/dL — AB
Specific Gravity, Urine: 1.025 (ref 1.005–1.030)
pH: 5 (ref 5.0–8.0)

## 2022-02-19 LAB — I-STAT CHEM 8, ED
BUN: 26 mg/dL — ABNORMAL HIGH (ref 6–20)
Calcium, Ion: 1.25 mmol/L (ref 1.15–1.40)
Chloride: 100 mmol/L (ref 98–111)
Creatinine, Ser: 1 mg/dL (ref 0.44–1.00)
Glucose, Bld: 99 mg/dL (ref 70–99)
HCT: 46 % (ref 36.0–46.0)
Hemoglobin: 15.6 g/dL — ABNORMAL HIGH (ref 12.0–15.0)
Potassium: 3.9 mmol/L (ref 3.5–5.1)
Sodium: 140 mmol/L (ref 135–145)
TCO2: 25 mmol/L (ref 22–32)

## 2022-02-19 NOTE — ED Provider Notes (Signed)
  [facility] EMERGENCY DEPARTMENT Provider Note   CSN: [id number] Arrival date & time: 02/19/22  0450     History {Add pertinent medical, surgical, social history, OB history to HPI:1} Chief Complaint  Patient presents with   Back Pain    Abigail [name] is a 31 y.o. female.   Back Pain      Home Medications Prior to Admission medications   Medication Sig Start Date End Date Taking? Authorizing Provider  cephALEXin (KEFLEX) 500 MG capsule Take 1 capsule (500 mg total) by mouth 4 (four) times daily. 02/25/21   Mancel Bale, MD  cloNIDine (CATAPRES) 0.2 MG tablet Take 1 tablet (0.2 mg total) by mouth 2 (two) times daily as needed (withdrawal symptoms). 06/03/20 07/14/20  Gilda Crease, MD      Allergies    Latex and Penicillins    Review of Systems   Review of Systems  Musculoskeletal:  Positive for back pain.    Physical Exam Updated Vital Signs BP (!) 139/96 (BP Location: Left Arm)   Pulse (!) 117   Temp 98.7 F (37.1 C) (Oral)   Resp 17   Ht 5\' 3"  (1.6 m)   Wt 72.6 kg   LMP 01/28/2022   SpO2 96%   BMI 28.34 kg/m  Physical Exam  ED Results / Procedures / Treatments   Labs (all labs ordered are listed, but only abnormal results are displayed) Labs Reviewed  URINALYSIS, ROUTINE W REFLEX MICROSCOPIC - Abnormal; Notable for the following components:      Result Value   Color, Urine AMBER (*)    APPearance HAZY (*)    Ketones, ur 20 (*)    Protein, ur 100 (*)    All other components within normal limits  I-STAT CHEM 8, ED - Abnormal; Notable for the following components:   BUN 26 (*)    Hemoglobin 15.6 (*)    All other components within normal limits  URINE CULTURE    EKG None  Radiology No results found.  Procedures Procedures  {Document cardiac monitor, telemetry assessment procedure when appropriate:1}  Medications Ordered in ED Medications - No data to display  ED Course/ Medical Decision Making/ A&P                            Medical Decision Making Amount and/or Complexity of Data Reviewed Labs: ordered.   ***  {Document critical care time when appropriate:1} {Document review of labs and clinical decision tools ie heart score, Chads2Vasc2 etc:1}  {Document your independent review of radiology images, and any outside records:1} {Document your discussion with family members, caretakers, and with consultants:1} {Document social determinants of health affecting pt's care:1} {Document your decision making why or why not admission, treatments were needed:1} Final Clinical Impression(s) / ED Diagnoses Final diagnoses:  Acute right-sided low back pain without sciatica  Dysuria    Rx / DC Orders ED Discharge Orders     None

## 2022-02-19 NOTE — ED Triage Notes (Signed)
Pt c/o lower back pain and states her kidney are the problem. Pt states she needs IV fluids.

## 2022-02-20 LAB — URINE CULTURE

## 2022-09-10 ENCOUNTER — Other Ambulatory Visit: Payer: Self-pay

## 2022-09-10 ENCOUNTER — Emergency Department (HOSPITAL_COMMUNITY)
Admission: EM | Admit: 2022-09-10 | Discharge: 2022-09-10 | Discharge status: Against Medical Advice | Payer: BLUE CROSS/BLUE SHIELD | Attending: Emergency Medicine | Admitting: Emergency Medicine

## 2022-09-10 DIAGNOSIS — Z5321 Procedure and treatment not carried out due to patient leaving prior to being seen by health care provider: Secondary | ICD-10-CM | POA: Insufficient documentation

## 2022-09-10 DIAGNOSIS — F1910 Other psychoactive substance abuse, uncomplicated: Secondary | ICD-10-CM | POA: Diagnosis present

## 2022-09-10 DIAGNOSIS — T40411A Poisoning by fentanyl or fentanyl analogs, accidental (unintentional), initial encounter: Secondary | ICD-10-CM | POA: Insufficient documentation

## 2022-09-10 LAB — COMPREHENSIVE METABOLIC PANEL
ALT: 32 U/L (ref 0–44)
AST: 24 U/L (ref 15–41)
Albumin: 3.6 g/dL (ref 3.5–5.0)
Alkaline Phosphatase: 97 U/L (ref 38–126)
Anion gap: 8 (ref 5–15)
BUN: 6 mg/dL (ref 6–20)
CO2: 27 mmol/L (ref 22–32)
Calcium: 9.3 mg/dL (ref 8.9–10.3)
Chloride: 101 mmol/L (ref 98–111)
Creatinine, Ser: 0.73 mg/dL (ref 0.44–1.00)
GFR, Estimated: >60 mL/min (ref >60)
Glucose, Bld: 105 mg/dL — ABNORMAL HIGH (ref 70–99)
Potassium: 3.9 mmol/L (ref 3.5–5.1)
Sodium: 136 mmol/L (ref 135–145)
Total Bilirubin: 0.2 mg/dL — ABNORMAL LOW (ref 0.3–1.2)
Total Protein: 8.4 g/dL — ABNORMAL HIGH (ref 6.5–8.1)

## 2022-09-10 LAB — CBC WITH DIFFERENTIAL/PLATELET
Abs Immature Granulocytes: 0.03 10*3/uL (ref 0.00–0.07)
Basophils Absolute: 0.1 10*3/uL (ref 0.0–0.1)
Basophils Relative: 0 %
Eosinophils Absolute: 0.2 10*3/uL (ref 0.0–0.5)
Eosinophils Relative: 2 %
HCT: 38.8 % (ref 36.0–46.0)
Hemoglobin: 11.9 g/dL — ABNORMAL LOW (ref 12.0–15.0)
Immature Granulocytes: 0 %
Lymphocytes Relative: 41 %
Lymphs Abs: 4.6 10*3/uL — ABNORMAL HIGH (ref 0.7–4.0)
MCH: 24.3 pg — ABNORMAL LOW (ref 26.0–34.0)
MCHC: 30.7 g/dL (ref 30.0–36.0)
MCV: 79.2 fL — ABNORMAL LOW (ref 80.0–100.0)
Monocytes Absolute: 0.4 10*3/uL (ref 0.1–1.0)
Monocytes Relative: 4 %
Neutro Abs: 5.9 10*3/uL (ref 1.7–7.7)
Neutrophils Relative %: 53 %
Platelets: 336 10*3/uL (ref 150–400)
RBC: 4.9 MIL/uL (ref 3.87–5.11)
RDW: 14.1 % (ref 11.5–15.5)
WBC: 11.2 10*3/uL — ABNORMAL HIGH (ref 4.0–10.5)
nRBC: 0 % (ref 0.0–0.2)

## 2022-09-10 LAB — I-STAT BETA HCG BLOOD, ED (MC, WL, AP ONLY): I-stat hCG, quantitative: <5 m[IU]/mL (ref <5)

## 2022-09-10 LAB — ETHANOL: Alcohol, Ethyl (B): <10 mg/dL (ref <10)

## 2022-09-10 LAB — RAPID URINE DRUG SCREEN, HOSP PERFORMED
Amphetamines: NOT DETECTED
Barbiturates: NOT DETECTED
Benzodiazepines: NOT DETECTED
Cocaine: NOT DETECTED
Opiates: NOT DETECTED
Tetrahydrocannabinol: NOT DETECTED

## 2022-09-10 NOTE — ED Triage Notes (Signed)
Pt reports using Fentanyl multiple times every day and wants to detox. Last use this morning. Endorses depression and wants mental health assitance but denies SI/HI.

## 2022-09-10 NOTE — ED Provider Triage Note (Signed)
Emergency Medicine Provider Triage Evaluation Note  Abigail [name] , a 32 y.o. female  was evaluated in triage.  Pt complains of cold mental health and fentanyl abuse.  She states she was clean for approximately 3 years but relapsed 1 month ago.  She has been using fentanyl daily.  She is requesting detox today.  She denies specific plans for suicide, but states that if she leaves here without being helped she will harm herself.  Denies HI.  Denies other symptoms.  Review of Systems  Positive: As above Negative: As above  Physical Exam  BP (!) 158/84   Pulse 69   Temp 98.3 F (36.8 C)   Resp 15   Ht 5\' 3"  (1.6 m)   Wt 72.6 kg   LMP  (LMP Unknown)   SpO2 100%   BMI 28.34 kg/m  Gen:   Awake, no distress   Resp:  Normal effort  MSK:   Moves extremities without difficulty  Other:  Tearful affect  Medical Decision Making  Medically screening exam initiated at 4:31 PM.  Appropriate orders placed.  Abigail [name] was informed that the remainder of the evaluation will be completed by another provider, this initial triage assessment does not replace that evaluation, and the importance of remaining in the ED until their evaluation is complete.  Medical clearance labs   Nehemiah Massed 09/10/22 6948

## 2023-05-11 ENCOUNTER — Other Ambulatory Visit: Payer: Self-pay

## 2023-05-11 ENCOUNTER — Emergency Department (HOSPITAL_COMMUNITY)
Admission: EM | Admit: 2023-05-11 | Discharge: 2023-05-11 | Discharge status: Against Medical Advice | Payer: BLUE CROSS/BLUE SHIELD | Attending: Emergency Medicine | Admitting: Emergency Medicine

## 2023-05-11 ENCOUNTER — Encounter (HOSPITAL_COMMUNITY): Payer: Self-pay | Admitting: Emergency Medicine

## 2023-05-11 DIAGNOSIS — Z5321 Procedure and treatment not carried out due to patient leaving prior to being seen by health care provider: Secondary | ICD-10-CM | POA: Diagnosis not present

## 2023-05-11 DIAGNOSIS — R519 Headache, unspecified: Secondary | ICD-10-CM | POA: Diagnosis present

## 2023-05-11 LAB — URINALYSIS, ROUTINE W REFLEX MICROSCOPIC
Bilirubin Urine: NEGATIVE
Glucose, UA: NEGATIVE mg/dL
Hgb urine dipstick: NEGATIVE
Ketones, ur: 5 mg/dL — AB
Nitrite: NEGATIVE
Protein, ur: 30 mg/dL — AB
Specific Gravity, Urine: 1.017 (ref 1.005–1.030)
pH: 5 (ref 5.0–8.0)

## 2023-05-11 NOTE — ED Notes (Signed)
Pt walked outside to smoke.

## 2023-05-11 NOTE — ED Triage Notes (Signed)
Pt presents with headache x 4 days.

## 2023-08-03 ENCOUNTER — Emergency Department (HOSPITAL_COMMUNITY)
Admission: EM | Admit: 2023-08-03 | Discharge: 2023-08-04 | Disposition: A | Discharge status: Home / Self Care | Payer: BLUE CROSS/BLUE SHIELD | Attending: Emergency Medicine | Admitting: Emergency Medicine

## 2023-08-03 ENCOUNTER — Other Ambulatory Visit: Payer: Self-pay

## 2023-08-03 DIAGNOSIS — R1110 Vomiting, unspecified: Secondary | ICD-10-CM | POA: Diagnosis present

## 2023-08-03 DIAGNOSIS — F1190 Opioid use, unspecified, uncomplicated: Secondary | ICD-10-CM

## 2023-08-03 DIAGNOSIS — F1110 Opioid abuse, uncomplicated: Secondary | ICD-10-CM | POA: Insufficient documentation

## 2023-08-03 NOTE — ED Triage Notes (Signed)
Pt states she needs to "detox from Fentanyl", reports last using approx 6 hours ago. Has been using daily. C/o arm pain. Pt appears anxious in triage. Denies SI/HI   Pt states she needs a place to "hang out" because she has no where to go in the cold.

## 2023-08-04 MED ORDER — BUPRENORPHINE HCL-NALOXONE HCL 2-0.5 MG SL FILM: ORAL_FILM | SUBLINGUAL | 0 refills | Status: DC

## 2023-08-04 NOTE — Discharge Instructions (Signed)

## 2023-08-04 NOTE — ED Provider Notes (Signed)
Pt now reports she can't take Sueanne Margarita, MD 08/04/23 [phone]

## 2023-08-04 NOTE — ED Provider Notes (Signed)
  [place] EMERGENCY DEPARTMENT AT [facility] Provider Note   CSN: [id number] Arrival date & time: 08/03/23  2328     History  Chief Complaint  Patient presents with   Addiction Problem    Abigail [name] is a 32 y.o. female.  The history is provided by the patient.  Patient presents for addiction problem.  She reports she uses fentanyl and meth.  Last use was several hours ago.  She feels anxious, and reports vomiting and diarrhea.  No fevers. She has no other known medical conditions. She has used Suboxone before but is unsure if it worked     Home Medications Prior to Admission medications   Medication Sig Start Date End Date Taking? Authorizing Provider  Buprenorphine HCl-Naloxone HCl (SUBOXONE) 2-0.5 MG FILM Day 1 - take one film sublingual.  You can take an additional dose if you have withdrawal symptoms Day 2 - take 2 films sublingual.  If no improvement after an hour you can take 2 more films sublingual Day 3 -  take 2 films sublingual.  If no improvement after an hour you can take 2 more films sublingual 08/04/23  Yes Zadie Rhine, MD  cloNIDine (CATAPRES) 0.2 MG tablet Take 1 tablet (0.2 mg total) by mouth 2 (two) times daily as needed (withdrawal symptoms). 06/03/20 07/14/20  Gilda Crease, MD      Allergies    Latex and Penicillins    Review of Systems   Review of Systems  Constitutional:  Negative for fever.  Gastrointestinal:  Positive for vomiting.  Psychiatric/Behavioral:  The patient is nervous/anxious.     Physical Exam Updated Vital Signs BP 121/70   Pulse 91   Temp 98.2 F (36.8 C)   Resp 20   SpO2 99%  Physical Exam CONSTITUTIONAL: Well developed/well nourished, disheveled HEAD: Normocephalic/atraumatic EYES: EOMI NEURO: Pt is awake/alert/appropriate, moves all extremitiesx4.  No facial droop.   EXTREMITIES:  full ROM SKIN: warm, color normal PSYCH: no abnormalities of mood noted, alert and oriented to  situation  ED Results / Procedures / Treatments   Labs (all labs ordered are listed, but only abnormal results are displayed) Labs Reviewed - No data to display  EKG None  Radiology No results found.  Procedures Procedures    Medications Ordered in ED Medications - No data to display  ED Course/ Medical Decision Making/ A&P                                 Medical Decision Making Risk Prescription drug management.   Patient presents seeking treatment for opioid use disorder.  She reports vomiting diarrhea but she is in no acute distress.  She is not tachycardic and not actively vomiting on my exam At this point she is safe for discharge home.  She is amenable to trialing Suboxone. This has been prescribed and sent to the local pharmacy for her.  She was given outpatient resources.        Final Clinical Impression(s) / ED Diagnoses Final diagnoses:  Opioid use disorder    Rx / DC Orders ED Discharge Orders          Ordered    Buprenorphine HCl-Naloxone HCl (SUBOXONE) 2-0.5 MG FILM        08/04/23 0052              Zadie Rhine, MD 08/04/23 [phone]

## 2023-08-14 NOTE — Discharge Summary (Signed)
 [facility] - Discharge Summary  Date of Admission: 08/11/2023 Date of Discharge: 08/14/2023 Attending Provider: Powell Berg, MD Hospital LOS:  2 days  Time Spent performing discharge services:  - 35 minutes Patient signed AMA for discharge today Pt did not complete Subutex  detox. Pt has behavioral issues.  She was loud, pressured and irritable.  Minimal participation session and did not attend groups.  She has an inappropriate behaviors and judgmental towards her roommate. She is not motivated for residential treatment.  Discharge Diagnoses and Medications   Principal Problem:   Opioid use disorder, severe (CMD) Active Problems:   GAD (generalized anxiety disorder)   Cocaine use disorder, moderate (CMD)   Substance induced mood disorder (CMD)  Discharge Medications:     Medication List     START taking these medications    naloxone  4 mg/actuation Spry TAKE HOME nasal spray Commonly known as: NARCAN  Administer 1 spray into affected nostril(s) once for 1 dose.       CONTINUE taking these medications    nicotine  21 mg/24 hr patch Commonly known as: NICODERM CQ  Place 1 patch on the skin Once Daily.       STOP taking these medications    buprenorphine -naloxone  2-0.5 mg per SL film Commonly known as: SUBOXONE    traZODone  100 mg tablet Commonly known as: DESYREL          Where to Get Your Medications     You can get these medications from any pharmacy   Bring a paper prescription for each of these medications naloxone  4 mg/actuation Spry TAKE HOME nasal spray    Risks, benefits, and side effects were discussed in detail prior to discharge. Hospital Course   Consulting Services: none Indication for Admission: Abigail [name] is a 33 y.o. female with a  past psychiatric history of polysubstance abuse, GAD, MDD and no significant past medical history who presented voluntarily to [facility] ED on 08/11/2023 requesting detox from IV fentanyl  and evaluation for related  mood disorder.   On eval, patient is endorsing severe withdrawals. She reports to using 1/2gm IV fentanyl  daily and last use was 2 days ago. She reports to longstanding abuse of fentanyl  since age 65. She also reports to monthly cocaine abuse and denies abusing any other illicit substance or alcohol. UDS + cocaine and fentanyl . BAL neg. She reports to prior detox at [facility] unit and relapsed soon after discharge. She has struggled to maintain any significant period of sobriety. She also reports to related mood disorder and is endorsing moderate symptoms of depression and severe anxiety. Denies any prior mental health diagnosis and no prior suicide attempt or self harm measures. Denies any SI, HI or AVH at present. She is alert and oriented x3 and in NAD. She appears anxious and restless due to withdrawals. Mood remains low and has flat affect. Sleep remains poor and appetite is decreased. She is presently homeless and has minimal social support. She is motivated to seek residential rehab.  Due to severity of symptoms and failure of outpatient management of patient's symptoms, the patient currently meets criteria for inpatient psychiatric care for medication management and acute crisis stabilization. On subutex  detox taper and started on zoloft  and buspar for mood and anxiety. CM working on establishing discharge and safety plans.  Current suicidal/homicidal ideations: Denies Current auditory/visual hallucinations: Denies  On admission   CBC: wnl CMP: wnl Alcohol Biomarkers: MCV: /GGT: /AST: /ALT: wnl Hgb A1c: na Lipid Profile: n/a TSH: n/a EKG Qtc: n/a UA: wnl Urine Preg:  neg UDS: + cocaine Urine Fentanyl : +ve Alcohol: neg Commitment Status: Voluntary  - Goals and Interventions:             - Treatment plan focuses on modifiable risk factors and includes: Group and milieu therapy, family/outpatient support meeting as indicated, medication adjustment as indicated, risk factor reduction,  education on importance of treatment compliance.   Ordered routine orders and as needed medications for withdrawals, sleep and anxiety.  Recommended to attend individual, group and unit milieux.  Patient participated minimally.   During the course of the hospitalization   Medications: Risks, benefits and side effects of current medications reviewed.  - The following home medications were continued:  - none - The following home medications were not continued:  - none - The following new medications were initiated:  - COWS protocol for withdrawals. - Subutex  taper - ends Sunday - nicotine  patch  - zoloft  50mg  daily - buspar 5mg  TID  Patient was started on Subutex  detox to control her withdrawals and taking Subutex .  Patient got the Subutex  8 mg this morning.  Recommended residential treatment on discharge.  CM faxed her information to [facility].  But patient does not want to go to a residential treatment.  Educated the risk and complications of opioid withdrawals discussed with her.  Educated that the risk of relapse and the serious complications discussed.  She states that she is aware of consequences and complications.  She is repeatedly asking for discharge.  Patient states that she want to leave  today.  She states I am going to signout today .  Educated the risk and complications of opioid withdrawals discussed again in detail.  She is mildly agitated and irritable.   She is high risk for relapse.  Narcan  prescription provided.  She denies any thoughts of hurting self or others.  She denies hearing voices or seeing things.  She signed AMA for discharge today. Patient repeatedly denies SI or HI. Patient discharged with the [facility]. Provided outpatient treatment at [facility] in [place].  Per notes, CM met with patient face to face to assess overall well-being. Patient seemed anxious and was inquiring about leaving. This Clinical research associate encouraged patient to call [facility] to complete her prescreen.  Referral sent  it to [facility].  Patient asked if [facility] would still take her even if she used before going there. CM informed that [facility] would not take her is she uses. Patient reports she needs to leave today because she only has a ride today.  Medication changes during this hospitalization:None Justification for two or more antipsychotic medications: None  Tobacco/Substance Use Recommendation   Tobacco use 30 days prior to admission?  Endorses Referral to outpatient treatment for Substance/Tobacco use disorder was offered and declined by the patient.  When applicable, scheduled referrals are listed below. FDA-approved cessation medication prescription offered/prescribed: Nicotine  replacement therapy Condition Upon Discharge    Vitals:  Vitals: BP: 105/74, Temp: 99.2 F (37.3 C), Temp Source: Oral, Heart Rate: 67, Resp: 17, SpO2: 98 %;   Mental Status Exam  General Appearance: Appropriately dressed and groomed and Fair eye contact Gait and Station: Normal Strength and Tone: Normal Attention and Concentration: Fluctuating Orientation: Oriented x4 Language: Normal Fund of KNowledge: Average Recent and Remote Memory: No impairment in recent or remote Speech: Rapid and Pressured Thought Process: Illogical Associations: Intact Mood: Irritable Thought Content Related to Harm to Self or Others: Denies suicidal thoughts, Denies suicidal intent and Denies Homicidal ideation Thought Content Not Related to Dangerousness: No disturbance  in thought content Perceptions: No auditory hallucinations, No perceptual disturbances and No visual hallucinations Insight: Limited Judgement: Limited Separately and in addition to evaluation and medical management,16 additional minutes were spent with patient today discussing stressors and providing supportive psychotherapy. Goals of therapy are to provide the patient with the tools to enact positive behavioral change, improve coping skills, validate and normalize thoughts, and  improve stress/anxiety management. Plan to continue therapy.   Discharge Instructions and Disposition   Discharge Orders     Lifting Limits     Details:    Lifting Limits: No lifting limits   Lifting Limits     Details:    Lifting Limits: No lifting limits      Discharge Follow-Up Plan: Clinical Follow-up (MH and/or SA Services):  Name of Provider Agency Referred: [facility] [place]   Date/Time of Appointment: Patient can walk in on 08/18/2023 at 8:30am for an assessment for substance usage Phone Number: [phone] Address: [address] [place], [place], [place] [address]                Reason: MEDICATION MANAGEMENT    Transportation: Family   **Please bring photo ID, social security card, insurance card, or proof of household income if they do not have insurance.**    Crisis Support Line: [phone]  The patient was referred to the providers listed above at the appointment time listed above for the treatment of substance use disorder.  Disposition: Discharge to home  Recommendations to providers: none  I have discussed the case with Dr. Vicenta MD, who has assisted in the formulation of the assessment and plan.

## 2023-08-18 NOTE — Congregational Nurse Program (Unsigned)
 Abigail [name] presented at the [facility] in [place], wearing a robe over her clothes. She mentioned that she was cold, and I offered her a coat, but she refused. Abigail [name] stated that she was detoxing from fentanyl . I asked if she needed to go to the emergency department, but she declined. She then left, walking away.

## 2023-09-06 NOTE — ED Triage Notes (Signed)
 Pt arrives today due to detox from fentanyl . Last use today, IV use.

## 2023-09-06 NOTE — Progress Notes (Signed)
 Mental Health Screener Initial Note  Reason for Consult: Opioid Use Detox  Abigail [name] is a 33 y.o., female who presented at [facility]  Emergency Department due to Drug / Alcohol Assessment  .    Diagnosis: (F11.20) Opioid Use Disorder, Severe  Recommendation: Pt will be held for AM consult.   Pt Information: Pt presented to the ED voluntarily requesting fentanyl  detox. Pt reported daily use for the past 4 years. Pt reports using unspecified amounts via IV. Pt reported last use was prior arrival to ED. Pt denied current withdrawal symptoms, but stated she can go at least 4 hours without using before experiencing withdrawal symptoms. Pt reported experiencing withdrawal seizures with the last one occurring 2 years ago. Pt denied mental health diagnoses. Pt denied current or previous SI/HI as well as no AH/VH. Pt UDS positive for cocaine, fentanyl , and THC.   Pt admitted to the unit for fentanyl  detox from 08/11/2023-08/14/2023: Abigail [name] is a 33 y.o. female with a  past psychiatric history of polysubstance abuse, GAD, MDD and no significant past medical history who presented voluntarily to [facility] ED on 08/11/2023 requesting detox from IV fentanyl  and evaluation for related mood disorder. On eval, patient is endorsing severe withdrawals. She reports to using 1/2gm IV fentanyl  daily and last use was 2 days ago. She reports to longstanding abuse of fentanyl  since age 77. She also reports to monthly cocaine abuse and denies abusing any other illicit substance or alcohol. UDS + cocaine and fentanyl . BAL neg. She reports to prior detox at [facility] unit and relapsed soon after discharge. She has struggled to maintain any significant period of sobriety. She also reports to related mood disorder and is endorsing moderate symptoms of depression and severe anxiety. Denies any prior mental health diagnosis and no prior suicide attempt or self harm  measures. Denies any SI, HI or AVH at present. She is alert and oriented x3 and in NAD. She appears anxious and restless due to withdrawals. Mood remains low and has flat affect. Sleep remains poor and appetite is decreased. She is presently homeless and has minimal social support. She is motivated to seek residential rehab. Patient signed AMA for discharge. Pt did not complete Subutex  detox. Pt has behavioral issues.  She was loud, pressured and irritable.  Minimal participation session and did not attend groups.  She has an inappropriate behaviors and judgmental towards her roommate. She is not motivated for residential treatment.         Risk to Others Access to Firearms/Weapons: No History of Harm to Others: No  Thought Content General Thought Content: Goal directed Delusions: None Visual Disturbances:  (none noted/reported) Hallucinations: None Thought Process: Goal directed Arousal: Alert Orientation: Fully oriented Attention Capacity: Normal Language Ability: Unremarkable Interpersonal Style: Guarded/Reserved Mood: Euthymic Affect: Appropriate for content  Sleep ADLs Number hours of sleep in a 24 hour period:  (none noted/reported) Problems with Sleep?: No Sleep pattern changes: Not Changed Amount of sleep has: Not changed  Diet ADLs Problems with eating or appetite?: No Weight gain or loss?: N/A Is patient on a special diet (ordered by MD)?: No  Accommodation ADLs Do you require any accommodations for special needs?: No  Social Considerations Are there any cultural/spiritual practices impacting your treatment?: No  Education Highest Grade Completed: 12 Currently Attending School: No     Family and Relationships Type of residence:: Private residence Primary family: Supportive Absence of Interpersonal Relationships: N/A  Past Psychiatric History  . Hospitalizations/ED Visits Yes   . Hospitalization/ED Visit Details Opioid withdrawal   . OP Providers  none noted/reported   . Past and current psychiatric diagnoses Opioid Use Disorder, Severe   . Psychiatric Medications none noted/reported   . Non-pharmacological Psychiatric Treatment/Brain Stimulation none noted/reported   . Aggressive Behaviors No   . Aggressive Behavior Details none noted/reported   . Suicide Attempts No   . Suicide Attempt Details none noted/reported   . Non-suicidal Self Injury No   . Non-Suicidal Self Injury Details none noted/reported    Social History   Substance and Sexual Activity  Drug Use Yes  . Types: Cocaine, Methamphetamines, Fentanyl    Family History  Problem Relation Name Age of Onset  . Hypertension Father    . Hypertension Brother      Withdrawal Symptoms Does the patient have current and/or history of withdrawal symptoms?: Yes (Pt denied experiencing withdrawal symptoms at the time of assessment.) Does the patient have a history of withdrawal seizures?: Yes Signs and Symptoms of Dependency Does the patient have signs/symptoms of dependency?: Yes Loss of Control Over Amount: Yes Efforts to cut back/control use: No More time getting, using, or recovering: Yes Cravings and urges to use the substance: Yes Unable to fulfill roles at work/home/school: Yes Continued use despite social problems: Yes Changed social/recreational activities: Yes Repeated use when physically hazardous: Yes Continued use when aware causing problems: Yes Tolerance: Yes Development of withdrawal symptoms: Yes   Past Psychotropic Medications     Name/Dose Start/Stop Date Effectiveness/Side Effects    see chart         Legal History Current Legal Status: None  Military History Have you ever been in the [employer]?: No Do you have immediate family currently serving or has served in the past?: No  Psychological Trauma History Psychological Trauma History (Current or Past): No

## 2023-09-07 NOTE — ED Provider Notes (Signed)
 EM Observation Re-evaluation note  Abigail [name] is a 33 y.o. female, seen on rounds today.  The patient presented with a chief complaint of drug/alcohol assessment.  Currently, the patient is awaiting psychiatry evaluation.    Psychiatric examination: Calm, cooperative, not responding to internal stimuli  MDM:  I have reviewed the labs performed to date as well as medications administered while in observation. Recent changes in the last 24 hours include nonspecific leukocytosis to 17, baseline anemia at 11.2.  Do not require intervention, CMP and ethanol are negative.  Urinalysis concentrated but noninfectious.  Drug screen positive for cocaine, fentanyl , marijuana.  Current plan is for psychiatry evaluation for inpatient fentanyl  detox.  Patient is not under full IVC at this time.   The patient is currently in observation status in order to determine necessity of psychiatric inpatient admission.  Patient's presentation is most consistent with acute presentation with potential threat to life or bodily function, due to opiate abuse.     Scribe's Attestation: This document serves as a record of services personally performed by Merilee Maffucci, MD. It was created on their behalf by Joesph KANDICE Apa, Scribe, a trained medical scribe. The creation of this record is the provider's dictation and/or activities during the visit.   Electronically signed by: Joesph KANDICE Apa, Scribe 09/07/2023 6:21 AM

## 2023-09-07 NOTE — ED Notes (Signed)
 Pt belongings include black sweater, pants, and comb. Placed in locker 5.   Baker Metro, CNA 09/07/23 [phone]

## 2023-09-29 NOTE — Congregational Nurse Program (Unsigned)
A patient at the [facility] presented with mild symptoms indicative of drug withdrawal. She reported that she has been actively working on decreasing her substance use, stating, "I'm cutting back on my drug use." During the conversation, she expressed a desire for a long-term recovery program to support her journey.  When I inquired whether she needed assistance beyond what she was currently receiving, she mentioned that she is already collaborating with a professional for support. I took the opportunity to advise her about the potential dangers associated with the continued abuse of narcotics, emphasizing the importance of taking care of her health during this critical transition. She acknowledged my concerns and indicated that she understood the risks involved in her situation.  Marlyce Huge RN MSN DNP

## 2023-09-30 ENCOUNTER — Ambulatory Visit (HOSPITAL_COMMUNITY)
Admission: EM | Admit: 2023-09-30 | Discharge: 2023-09-30 | Disposition: A | Discharge status: Home / Self Care | Payer: BLUE CROSS/BLUE SHIELD

## 2023-09-30 DIAGNOSIS — F1120 Opioid dependence, uncomplicated: Secondary | ICD-10-CM | POA: Diagnosis not present

## 2023-09-30 NOTE — ED Notes (Signed)
Patient Is discharging at this time. Printed AVS reviewed with patient by provider along with follow up appointments and resources. Patient denies SI, HI, and A/V/H. Valuables/belongings returned to patient. No s/s of current distress.  

## 2023-09-30 NOTE — ED Provider Notes (Signed)
Behavioral Health Urgent Care Medical Screening Exam  Patient Name: Abigail [name]  MRN: [id number]  Date of Evaluation: 09/30/23  Chief Complaint: "I need fentanyl detox".  Diagnosis:  Final diagnoses:  Opioid use disorder, moderate, dependence (HCC)    History of Present illness: Abigail [name] is a 33 y.o.Caucasian female. Vondell voluntarily presents to the [facility] seeking opioid detoxification treatments. Patient presents to the [facility] with all her belongings in a black garbage bag. She reports,   :This is my first time in this facility. I need fentanyl detox. I have been using fentanyl via IV since December, 2023. I use it everyday. I use my last fentanyl this morning. I do not have any mental health diagnosis & I'm not taking any medicines. I have been to in the past for detox; [facility] house, [facility] I was on Suboxone in the past. My doctor then was at the [facility]. The last time I was on Suboxone was in 2020. I'm not having any withdrawal symptoms right now, but it is fixing to come at any time. My withdrawal symptoms starts around 4 hours after my last use. Like I said, I have no mental health issues, but mental illness run on both sides of my family. I have never attempted suicide. My father & my brother had attempted suicide, but both survived. My father is deceased now, but not by suicide. I'm homeless. I came here with a friend". Patient at this time denies any SIHI, AVH, delusional thoughts or paranoia. He does not appear to be responding to any internal stimuli. She currently denies any muscle ache, headaches, abdominal issues (diarrhea, nausea, cramps or vomiting).   Patient present relaxed while eating pop-corn & drinking some soft reddish drink.  Flowsheet Row ED from 09/30/2023 in [facility] ED from 08/03/2023 in [facility] Emergency Department at [facility] ED from 05/11/2023 in [facility] Emergency Department at [facility]  C-SSRS RISK CATEGORY No Risk No Risk No Risk      Psychiatric Specialty Exam  Presentation  General Appearance:Appropriate for Environment; Casual; Fairly Groomed  Eye Contact:Good  Speech:Clear and Coherent; Normal Rate  Speech Volume:Normal  Handedness:Right   Mood and Affect  Mood: -- (Denies any symptoms of depression/anxiety.)  Affect: Congruent   Thought Process  Thought Processes: Coherent; Goal Directed  Descriptions of Associations:Intact  Orientation:Full (Time, Place and Person)  Thought Content:Logical    Hallucinations:None  Ideas of Reference:None  Suicidal Thoughts:No  Homicidal Thoughts:No   Sensorium  Memory: Immediate Good; Recent Good; Remote Good  Judgment: Fair  Insight: Fair   Art therapist  Concentration: Good  Attention Span: Good  Recall: Good  Fund of Knowledge: Fair  Language: Good   Psychomotor Activity  Psychomotor Activity: Normal   Assets  Assets: Communication Skills; Desire for Improvement; Physical Health; Resilience; Social Support  Sleep  Sleep: Good  Number of hours:  7.5  Physical Exam: Physical Exam Vitals and nursing note reviewed.  Cardiovascular:     Rate and Rhythm: Normal rate.     Pulses: Normal pulses.  Pulmonary:     Effort: Pulmonary effort is normal.  Genitourinary:    Comments: Deferred. Musculoskeletal:        General: Normal range of motion.     Cervical back: Normal range of motion.  Skin:    General: Skin is warm and dry.  Neurological:     General: No focal deficit  present.     Mental Status: She is alert and oriented to person, place, and time. Mental status is at baseline.    Review of Systems  Constitutional:  Negative for chills, diaphoresis and fever.  HENT:  Negative for congestion and sore throat.   Respiratory:  Negative for cough, shortness of breath and wheezing.   Cardiovascular:   Negative for chest pain and palpitations.  Gastrointestinal:  Negative for abdominal pain, constipation, diarrhea, heartburn, nausea and vomiting.  Musculoskeletal:  Negative for joint pain and myalgias.  Neurological:  Negative for dizziness, tingling, tremors, sensory change, speech change, focal weakness, seizures, loss of consciousness, weakness and headaches.  Endo/Heme/Allergies:        Allergies: PCN.   Other: Latex.  Psychiatric/Behavioral:  Positive for substance abuse. Negative for depression, hallucinations, memory loss and suicidal ideas. The patient is not nervous/anxious (Opioid use disorder.) and does not have insomnia.    Blood pressure (!) 113/55, pulse 64, temperature 97.9 F (36.6 C), temperature source Oral, resp. rate 18, SpO2 97%. There is no height or weight on file to calculate BMI.  Musculoskeletal: Strength & Muscle Tone: within normal limits Gait & Station: normal Patient leans: N/A   [facility] MSE Discharge Disposition for Follow up and Recommendations: Based on my evaluation the patient does not appear to have an emergency medical condition and can be discharged with resources and follow up care in outpatient services for substance abuse treatments.   Patient is also provided with list of sober living houses & homeless shelters within [place], [place].  Abigail [name] is currently instructed to call [phone], the crisis hotline or go the nearest ED in the vent of worsening symptoms or if SIHI develop.  Armandina Stammer, NP, pmhnp, fnp-bc. 09/30/2023, 3:04 PM

## 2023-09-30 NOTE — Discharge Instructions (Addendum)
Homeless / Shelter Resources    [facility]          -(Please Call) Phone: [phone]   Provides housing and specialized case management focused on housing stability.    [place] [facility] NIGHT SHELTER) [address] [place], [place] Phone: [phone]   [facility] has been providing emergency shelter to those in need of a permanent residence for over 35 years. The [employer] shelter plays an important role in our community.   There are many life events that can pull someone into a downward spiral towards poverty that is very difficult to get out of. Homelessness is a problem that can affect anyone of [place]. [employer] is a safe and comforting place to stay, especially if you have experienced the hardship of street life.   [employer] provides a single bed and bedding to 100 adult men and women. The shelter welcomes all who are in need of housing, no one in real need is turned away unless space is not available.   While staying at [facility], guests are offered more than just a bed for a night. Hot meals are provided and every guest has access to case management services. Case managers provide assistance with finding housing, employment, or other services that will help them gain stability. Continuous stay is based on availability, capacity, and progress towards goals.   To contact the front desk of [facility] please call   [phone] ext [phone] or ext. [phone].   [facility] [address], [place], [place] [address] Phone: [phone]   [facility] (Women only) [address] [place], [place] [address] Phone: [phone]   [facility] Address: [address] [place], [place] [address] Hours:   Opens 9?AM Mon Phone: [phone]  [facility] providers a continuum of housing services to those experiencing homelessness. They provide transitional [employer] and permanent supportive housing ([place] [employer]) to disabled veterans experiencing homelessness. There is a fast track Rapid Rehousing program couples housing stability services with temporary financial assistance to quickly house individuals and families experiencing homelessness.   [employer] [address], [place] [address] Phone: [phone]   [facility] [address] Mikle Bosworth [place], [place] [address] Phone: [phone]   [facility] [address], [place], [place] [address] Check in at 6:00PM for placement at a local shelter) Phone: [phone]   [facility] Eligibility:  Must be drug and alcohol free for at least 14 days or more at the time of application. This program serves males.  Houses Engineer, civil (consulting), Economist who serve six-month terms.  Houses are financially self-supporting; members split house expenses, which average $90.00 to $130.00 per person per week.  Any Resident who relapses must be immediately expelled. Call:  [phone]   [facility]  Address: [address] [place], [place] [address]  Phone#: [phone] [facility] Address: [address] [place], [place] [address] Phone: [phone]  -[facility] provides food, shelter and other programs and services to the homeless men of [place] through our [employer] program.  By offering safe shelter, three meals a day, clean clothing, Biblical counseling, financial planning, vocational training, GED/education and employment assistance, we've helped mend the shattered lives of many homeless men since opening in [place].  We have approximately 267  beds available, with a max of 312 beds including mats for emergency situations and currently house an average of 270 men a night.  Prospective Client Check-In Information Photo ID  Required (State/ Out of State/ [facility]) - if photo ID is not available, clients are required to have a printout of a police/sheriff's criminal history report. Help out with chores around the Mission. No sex offender of any type (pending, charged, registered and/or any other sex related offenses) will be permitted to check in. Must be willing to abide by all rules, regulations, and policies established by the [employer]. The following will be provided - shelter, food, clothing, and biblical counseling. If you or someone you know is in need of assistance at our [facility] shelter in [place], [place], please call [phone] ext. [phone].  Women Shelter for [employer] hours are Monday-Friday only.    .. Substance Abuse Resources     SUBSTANCE USE TREATMENT for Medicaid and State Funded/IPRS  [facility] [address] [place], [place], [address] [phone] phone NOTE: [facility] is no longer offering IOP services.  Serves those who are low-income or have no insurance.  [facility] [address], [place], [place], [address] [phone] phone [phone] fax NOTE: Does have Substance Abuse-Intensive Outpatient Program [facility]) as well as transitional housing if eligible.  [facility] [address] [place], [place], [address] [phone] phone [phone] fax  [facility] [phone] [place], [place], [address] [phone] phone [phone] fax    HALFWAY HOUSES:  [facility] [phone]  [employer].oxfordvacancies.com     12 STEP PROGRAMS:  Alcoholics Anonymous of [employer]  Narcotics Anonymous of [place] [employer]  Al-Anon of [employer], [place] www.g[place]alanon.org/find-meetings.html  Nar-Anon https://nar-anon.org/find-a-meetin      List of Residential placements:   [facility] in [place]:  [phone]  [facility]: [phone]  [place], [place] [phone]: Female and female facility; 30-day program: (uninsured and Medicaid such as Laurena Bering, [place], [place], partners)  [facility]: [phone]; men and women's facility; 28 days; Can have Medicaid tailored plan Tour manager or Partners)  [facility]: [phone] Karoline [place]dwell or Larita Fife; 28 day program; must be fully detox; tailored Medicaid or no insurance  [address] in [place], [place]; [phone]; 28 day all males program; no insurance accepted  [facility] in [place]: Gabriel Rung [phone] (no insurance or Medicaid only); 90 days; outpatient services but provide housing in apartments downtown [place]  RTS Admission: [phone]: Patient must complete phone screening for placement: [place], [place]; 6 month program; uninsured, Medicaid, and [employer].   [facility]: no insurance required; [phone]  [facility]: [phone]; Intake: Molly Maduro; Must fill out application online; Alecia Lemming Delay [phone] x [address] Mission in [place], [place]: [phone]; Admissions Coordinators Mr. Maurine Minister or Barron Alvine; 90 day program.  [facility]: [place], [place] [phone]; Co-Ed 9 month to a year program; Online application; Men entry fee is $500 (6-41months);  [employer]: [address] [place], [place] [address]; no fee or insurance required; minimum of 2 years; Highly structured; work based; Intake Coordinator is Thayer Ohm [phone]  [facility] in [place], [place]: [phone]; Fax number is [phone]; website: www.[facility].org; Requires 3-6 page autobiography; 2 year program (18 months and then 56month transitional housing); Admission fee is $300; no insurance needed; work Automotive engineer in [place], [place]: [employer] Desk Staff: Danise Edge [phone]: They have a Men's  Regenerations  Program 6-76months. Free program; There is an initial $300 fee however, they are willing to work with patients regarding that. Application is online.  [facility]: Admissions [phone] Doran Heater ext [phone]; Any 7-90 day program is out of pocket; 12 month program is free of charge; there is a $275 entry fee; Patient is responsible for own transportation    Based on the information that you have provided and the presenting issues outpatient services and resources for have been recommended.  It is imperative that you follow through with treatment recommendations within 5-7 days from the of discharge to mitigate further risk to your safety and mental well-being. A list of referrals has been provided below to get you started.  You are not limited to the list provided.  In case of an urgent crisis, you may contact the Mobile Crisis Unit with [facility] at [phone].   Medication Assistance Treatment (MAT)  Clinics in [facility]    [facility] [address] , [place], [place] [address] [phone]  [facility] [address] Renee Rival [place], [place] [address] [phone]    [facility]  [address] [place], [place] [address] [phone]  [facility] [address] [place], [place] [address] [phone]   [facility] [address] [place] [phone]  [facility]  [address], [place], [place] [address] [phone]  [facility] [address] [place] [address]  [phone]
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
## 2023-10-07 ENCOUNTER — Other Ambulatory Visit: Payer: Self-pay

## 2023-10-07 ENCOUNTER — Emergency Department (HOSPITAL_COMMUNITY): Payer: BLUE CROSS/BLUE SHIELD

## 2023-10-07 ENCOUNTER — Encounter (HOSPITAL_COMMUNITY): Payer: Self-pay

## 2023-10-07 ENCOUNTER — Emergency Department (HOSPITAL_COMMUNITY)
Admission: EM | Admit: 2023-10-07 | Discharge: 2023-10-08 | Disposition: A | Discharge status: Home / Self Care | Payer: BLUE CROSS/BLUE SHIELD | Attending: Emergency Medicine | Admitting: Emergency Medicine

## 2023-10-07 DIAGNOSIS — R202 Paresthesia of skin: Secondary | ICD-10-CM | POA: Insufficient documentation

## 2023-10-07 DIAGNOSIS — Z91040 Latex allergy status: Secondary | ICD-10-CM | POA: Insufficient documentation

## 2023-10-07 DIAGNOSIS — R1084 Generalized abdominal pain: Secondary | ICD-10-CM | POA: Diagnosis not present

## 2023-10-07 DIAGNOSIS — R0602 Shortness of breath: Secondary | ICD-10-CM | POA: Diagnosis not present

## 2023-10-07 DIAGNOSIS — R109 Unspecified abdominal pain: Secondary | ICD-10-CM | POA: Diagnosis present

## 2023-10-07 DIAGNOSIS — R079 Chest pain, unspecified: Secondary | ICD-10-CM | POA: Diagnosis not present

## 2023-10-07 DIAGNOSIS — M549 Dorsalgia, unspecified: Secondary | ICD-10-CM | POA: Insufficient documentation

## 2023-10-07 DIAGNOSIS — R1013 Epigastric pain: Secondary | ICD-10-CM

## 2023-10-07 DIAGNOSIS — M5090 Cervical disc disorder, unspecified, unspecified cervical region: Secondary | ICD-10-CM

## 2023-10-07 LAB — URINALYSIS, ROUTINE W REFLEX MICROSCOPIC
Bilirubin Urine: NEGATIVE
Glucose, UA: NEGATIVE mg/dL
Hgb urine dipstick: NEGATIVE
Ketones, ur: NEGATIVE mg/dL
Nitrite: NEGATIVE
Protein, ur: NEGATIVE mg/dL
Specific Gravity, Urine: 1.031 — ABNORMAL HIGH (ref 1.005–1.030)
pH: 5 (ref 5.0–8.0)

## 2023-10-07 LAB — COMPREHENSIVE METABOLIC PANEL
ALT: 14 U/L (ref 0–44)
AST: 18 U/L (ref 15–41)
Albumin: 4.1 g/dL (ref 3.5–5.0)
Alkaline Phosphatase: 70 U/L (ref 38–126)
Anion gap: 9 (ref 5–15)
BUN: 16 mg/dL (ref 6–20)
CO2: 26 mmol/L (ref 22–32)
Calcium: 8.7 mg/dL — ABNORMAL LOW (ref 8.9–10.3)
Chloride: 102 mmol/L (ref 98–111)
Creatinine, Ser: 0.73 mg/dL (ref 0.44–1.00)
GFR, Estimated: >60 mL/min (ref >60)
Glucose, Bld: 85 mg/dL (ref 70–99)
Potassium: 3.4 mmol/L — ABNORMAL LOW (ref 3.5–5.1)
Sodium: 137 mmol/L (ref 135–145)
Total Bilirubin: 0.6 mg/dL (ref 0.0–1.2)
Total Protein: 8.7 g/dL — ABNORMAL HIGH (ref 6.5–8.1)

## 2023-10-07 LAB — CBC
HCT: 35.6 % — ABNORMAL LOW (ref 36.0–46.0)
Hemoglobin: 10.7 g/dL — ABNORMAL LOW (ref 12.0–15.0)
MCH: 23.3 pg — ABNORMAL LOW (ref 26.0–34.0)
MCHC: 30.1 g/dL (ref 30.0–36.0)
MCV: 77.6 fL — ABNORMAL LOW (ref 80.0–100.0)
Platelets: 336 10*3/uL (ref 150–400)
RBC: 4.59 MIL/uL (ref 3.87–5.11)
RDW: 16.1 % — ABNORMAL HIGH (ref 11.5–15.5)
WBC: 11.9 10*3/uL — ABNORMAL HIGH (ref 4.0–10.5)
nRBC: 0 % (ref 0.0–0.2)

## 2023-10-07 LAB — TROPONIN I (HIGH SENSITIVITY): Troponin I (High Sensitivity): <2 ng/L (ref <18)

## 2023-10-07 LAB — PREGNANCY, URINE: Preg Test, Ur: NEGATIVE

## 2023-10-07 LAB — LIPASE, BLOOD: Lipase: 29 U/L (ref 11–51)

## 2023-10-07 NOTE — ED Triage Notes (Signed)
 Complaining of pain in the epigastric region of her abdomen that has caused her to vomit. Complaining of her sciatic nerve pain bilaterally

## 2023-10-07 NOTE — ED Provider Notes (Signed)
 [place] EMERGENCY DEPARTMENT AT [facility] Provider Note   CSN: [id number] Arrival date & time: 10/07/23  1836     History  Chief Complaint  Patient presents with   Abdominal Pain    AILANY [name] is a 33 y.o. female.  Patient is a 33 year old female with a past medical history of IV drug abuse who presents to the emergency department with a chief complaint of abdominal pain, back pain, intermittent paresthesias to bilateral lower extremities which has been ongoing for the past few days.  Patient notes that she has had no associated nausea, vomiting, diarrhea.  She notes that she has had no associated dizziness, lightheadedness or syncopal events.  Patient denies any associated fever or chills.  She denies any edema to lower extremities.  She does admit to some intermittent chest pain and shortness of breath.   Abdominal Pain      Home Medications Prior to Admission medications   Medication Sig Start Date End Date Taking? Authorizing Provider  cloNIDine (CATAPRES) 0.2 MG tablet Take 1 tablet (0.2 mg total) by mouth 2 (two) times daily as needed (withdrawal symptoms). 06/03/20 07/14/20  Gilda Crease, MD      Allergies    Latex and Penicillins    Review of Systems   Review of Systems  Gastrointestinal:  Positive for abdominal pain.  All other systems reviewed and are negative.   Physical Exam Updated Vital Signs BP 118/69   Pulse 84   Temp 98.6 F (37 C) (Oral)   Resp 18   Ht 5\' 3"  (1.6 m)   Wt 71.7 kg   LMP 09/27/2023   SpO2 98%   BMI 27.99 kg/m  Physical Exam Vitals reviewed.  Constitutional:      Appearance: Normal appearance.  HENT:     Head: Normocephalic and atraumatic.     Nose: Nose normal.     Mouth/Throat:     Mouth: Mucous membranes are moist.  Eyes:     Extraocular Movements: Extraocular movements intact.     Conjunctiva/sclera: Conjunctivae normal.     Pupils: Pupils are equal, round, and reactive to light.   Cardiovascular:     Rate and Rhythm: Normal rate and regular rhythm.     Pulses: Normal pulses.     Heart sounds: Normal heart sounds. No murmur heard.    No gallop.  Pulmonary:     Effort: Pulmonary effort is normal. No respiratory distress.     Breath sounds: Normal breath sounds. No wheezing or rhonchi.  Abdominal:     General: Abdomen is flat. Bowel sounds are normal.     Palpations: Abdomen is soft.     Tenderness: There is abdominal tenderness in the epigastric area. There is no right CVA tenderness, left CVA tenderness, guarding or rebound. Negative signs include Murphy's sign, McBurney's sign and obturator sign.  Musculoskeletal:        General: Normal range of motion.     Cervical back: Normal range of motion and neck supple.  Skin:    General: Skin is warm and dry.  Neurological:     General: No focal deficit present.     Mental Status: She is alert and oriented to person, place, and time. Mental status is at baseline.  Psychiatric:        Mood and Affect: Mood normal.        Behavior: Behavior normal.        Thought Content: Thought content normal.  Judgment: Judgment normal.     ED Results / Procedures / Treatments   Labs (all labs ordered are listed, but only abnormal results are displayed) Labs Reviewed  COMPREHENSIVE METABOLIC PANEL - Abnormal; Notable for the following components:      Result Value   Potassium 3.4 (*)    Calcium 8.7 (*)    Total Protein 8.7 (*)    All other components within normal limits  CBC - Abnormal; Notable for the following components:   WBC 11.9 (*)    Hemoglobin 10.7 (*)    HCT 35.6 (*)    MCV 77.6 (*)    MCH 23.3 (*)    RDW 16.1 (*)    All other components within normal limits  URINALYSIS, ROUTINE W REFLEX MICROSCOPIC - Abnormal; Notable for the following components:   APPearance HAZY (*)    Specific Gravity, Urine 1.031 (*)    Leukocytes,Ua TRACE (*)    Bacteria, UA RARE (*)    All other components within normal  limits  LIPASE, BLOOD  PREGNANCY, URINE  POC URINE PREG, ED    EKG EKG Interpretation Date/Time:  Wednesday October 07 2023 19:15:45 EST Ventricular Rate:  72 PR Interval:  130 QRS Duration:  80 QT Interval:  422 QTC Calculation: 462 R Axis:   88  Text Interpretation: Normal sinus rhythm Normal ECG When compared with ECG of 10-Sep-2022 16:44, No significant change was found Confirmed by Meridee Score [phone]) on 10/07/2023 7:17:30 PM  Radiology No results found.  Procedures Procedures    Medications Ordered in ED Medications - No data to display  ED Course/ Medical Decision Making/ A&P                                 Medical Decision Making Patient does remain stable at this time.  Blood work has overall been unremarkable up until this point.  Troponin is still pending.  EKG demonstrates no acute ischemic changes.  CT scan of the abdomen and pelvis performed as patient was complaining of generalized abdominal pain.  MRI of the thoracic and lumbar spine is still pending at this point.  Will sign patient out to oncoming provider Dr. Bebe Shaggy at 2311 on 10/07/23 pending imaging results.  Suspect patient will be discharged home if imaging is clean.  Amount and/or Complexity of Data Reviewed Labs: ordered. Radiology: ordered.           Final Clinical Impression(s) / ED Diagnoses Final diagnoses:  None    Rx / DC Orders ED Discharge Orders     None         Kathlen Mody 10/07/23 2311    Zadie Rhine, MD 10/08/23 Rich Fuchs

## 2023-10-07 NOTE — ED Notes (Signed)
 CT called to notify pt order to be without contrast.

## 2023-10-07 NOTE — ED Notes (Signed)
 Patient transported to MRI

## 2023-10-08 NOTE — ED Notes (Signed)
 Snack given.

## 2023-10-08 NOTE — Discharge Instructions (Addendum)

## 2023-12-07 ENCOUNTER — Emergency Department (HOSPITAL_COMMUNITY)
Admission: EM | Admit: 2023-12-07 | Discharge: 2023-12-08 | Disposition: A | Discharge status: Home / Self Care | Payer: MEDICAID | Attending: Emergency Medicine | Admitting: Emergency Medicine

## 2023-12-07 ENCOUNTER — Encounter (HOSPITAL_COMMUNITY): Payer: Self-pay

## 2023-12-07 ENCOUNTER — Other Ambulatory Visit: Payer: Self-pay

## 2023-12-07 DIAGNOSIS — Z91040 Latex allergy status: Secondary | ICD-10-CM | POA: Insufficient documentation

## 2023-12-07 DIAGNOSIS — Z711 Person with feared health complaint in whom no diagnosis is made: Secondary | ICD-10-CM | POA: Insufficient documentation

## 2023-12-07 DIAGNOSIS — Z113 Encounter for screening for infections with a predominantly sexual mode of transmission: Secondary | ICD-10-CM | POA: Diagnosis present

## 2023-12-07 NOTE — ED Provider Notes (Signed)
 [place] EMERGENCY DEPARTMENT AT [facility] Provider Note   CSN: [id number] Arrival date & time: 12/07/23  2118     History  Chief Complaint  Patient presents with   "testing for every disease"    Abigail [name] is a 33 y.o. female.  Presents to the emergency department stating that she thinks she needs testing for all diseases.  She is concerned about body fluid contact and STDs.       Home Medications Prior to Admission medications   Medication Sig Start Date End Date Taking? Authorizing Provider  cloNIDine  (CATAPRES ) 0.2 MG tablet Take 1 tablet (0.2 mg total) by mouth 2 (two) times daily as needed (withdrawal symptoms). 06/03/20 07/14/20  Ballard Bongo, MD      Allergies    Latex and Penicillins    Review of Systems   Review of Systems  Physical Exam Updated Vital Signs BP (!) 140/84 (BP Location: Right Arm)   Pulse 70   Temp 98.3 F (36.8 C) (Temporal)   Resp 18   Ht 5\' 3"  (1.6 m)   Wt 71.7 kg   LMP 11/16/2023 (Approximate)   SpO2 100%   BMI 28.00 kg/m  Physical Exam Vitals and nursing note reviewed.  Constitutional:      General: She is not in acute distress.    Appearance: She is well-developed.  HENT:     Head: Normocephalic and atraumatic.     Mouth/Throat:     Mouth: Mucous membranes are moist.  Eyes:     General: Vision grossly intact. Gaze aligned appropriately.     Extraocular Movements: Extraocular movements intact.     Conjunctiva/sclera: Conjunctivae normal.  Cardiovascular:     Rate and Rhythm: Normal rate and regular rhythm.     Pulses: Normal pulses.     Heart sounds: Normal heart sounds, S1 normal and S2 normal. No murmur heard.    No friction rub. No gallop.  Pulmonary:     Effort: Pulmonary effort is normal. No respiratory distress.     Breath sounds: Normal breath sounds.  Abdominal:     General: Bowel sounds are normal.     Palpations: Abdomen is soft.     Tenderness: There is no abdominal  tenderness. There is no guarding or rebound.     Hernia: No hernia is present.  Musculoskeletal:        General: No swelling.     Cervical back: Full passive range of motion without pain, normal range of motion and neck supple. No spinous process tenderness or muscular tenderness. Normal range of motion.     Right lower leg: No edema.     Left lower leg: No edema.  Skin:    General: Skin is warm and dry.     Capillary Refill: Capillary refill takes less than 2 seconds.     Findings: No ecchymosis, erythema, rash or wound.  Neurological:     General: No focal deficit present.     Mental Status: She is alert and oriented to person, place, and time.     GCS: GCS eye subscore is 4. GCS verbal subscore is 5. GCS motor subscore is 6.     Cranial Nerves: Cranial nerves 2-12 are intact.     Sensory: Sensation is intact.     Motor: Motor function is intact.     Coordination: Coordination is intact.  Psychiatric:        Attention and Perception: Attention normal.  Mood and Affect: Mood normal.        Speech: Speech normal.        Behavior: Behavior normal.     ED Results / Procedures / Treatments   Labs (all labs ordered are listed, but only abnormal results are displayed) Labs Reviewed  RPR  HIV ANTIBODY (ROUTINE TESTING W REFLEX)  PREGNANCY, URINE  GC/CHLAMYDIA PROBE AMP ([place]) NOT AT [facility]    EKG None  Radiology No results found.  Procedures Procedures    Medications Ordered in ED Medications - No data to display  ED Course/ Medical Decision Making/ A&P                                 Medical Decision Making Amount and/or Complexity of Data Reviewed Labs: ordered.   Patient with concern for possible transmitted disease exposure.        Final Clinical Impression(s) / ED Diagnoses Final diagnoses:  Concern about STD in female without diagnosis    Rx / DC Orders ED Discharge Orders     None         Lugenia Assefa, Marine Sia, MD 12/07/23  2335

## 2023-12-08 LAB — HIV ANTIBODY (ROUTINE TESTING W REFLEX): HIV Screen 4th Generation wRfx: NONREACTIVE

## 2023-12-08 LAB — RPR: RPR Ser Ql: NONREACTIVE

## 2023-12-08 LAB — PREGNANCY, URINE: Preg Test, Ur: NEGATIVE

## 2023-12-08 NOTE — ED Notes (Signed)
 Pt unable to provide urine sample at this time

## 2023-12-09 LAB — GC/CHLAMYDIA PROBE AMP (~~LOC~~) NOT AT ARMC
Chlamydia: NEGATIVE
Comment: NEGATIVE
Comment: NORMAL
Neisseria Gonorrhea: NEGATIVE

## 2024-01-03 ENCOUNTER — Encounter (HOSPITAL_COMMUNITY): Payer: Self-pay | Admitting: Emergency Medicine

## 2024-01-03 ENCOUNTER — Emergency Department (HOSPITAL_COMMUNITY): Payer: MEDICAID

## 2024-01-03 ENCOUNTER — Other Ambulatory Visit: Payer: Self-pay

## 2024-01-03 ENCOUNTER — Emergency Department (HOSPITAL_COMMUNITY)
Admission: EM | Admit: 2024-01-03 | Discharge: 2024-01-04 | Disposition: A | Discharge status: Home / Self Care | Payer: MEDICAID | Attending: Emergency Medicine | Admitting: Emergency Medicine

## 2024-01-03 DIAGNOSIS — R112 Nausea with vomiting, unspecified: Secondary | ICD-10-CM | POA: Diagnosis present

## 2024-01-03 DIAGNOSIS — Z79899 Other long term (current) drug therapy: Secondary | ICD-10-CM | POA: Insufficient documentation

## 2024-01-03 DIAGNOSIS — N289 Disorder of kidney and ureter, unspecified: Secondary | ICD-10-CM

## 2024-01-03 DIAGNOSIS — R17 Unspecified jaundice: Secondary | ICD-10-CM

## 2024-01-03 DIAGNOSIS — R748 Abnormal levels of other serum enzymes: Secondary | ICD-10-CM

## 2024-01-03 DIAGNOSIS — Z91040 Latex allergy status: Secondary | ICD-10-CM | POA: Diagnosis not present

## 2024-01-03 DIAGNOSIS — K529 Noninfective gastroenteritis and colitis, unspecified: Secondary | ICD-10-CM

## 2024-01-03 DIAGNOSIS — D509 Iron deficiency anemia, unspecified: Secondary | ICD-10-CM

## 2024-01-03 DIAGNOSIS — E876 Hypokalemia: Secondary | ICD-10-CM | POA: Diagnosis not present

## 2024-01-03 DIAGNOSIS — R109 Unspecified abdominal pain: Secondary | ICD-10-CM | POA: Diagnosis not present

## 2024-01-03 DIAGNOSIS — T50901A Poisoning by unspecified drugs, medicaments and biological substances, accidental (unintentional), initial encounter: Secondary | ICD-10-CM

## 2024-01-03 DIAGNOSIS — R7401 Elevation of levels of liver transaminase levels: Secondary | ICD-10-CM

## 2024-01-03 LAB — RAPID URINE DRUG SCREEN, HOSP PERFORMED
Amphetamines: POSITIVE — AB
Barbiturates: NOT DETECTED
Benzodiazepines: NOT DETECTED
Cocaine: POSITIVE — AB
Opiates: NOT DETECTED
Tetrahydrocannabinol: NOT DETECTED

## 2024-01-03 LAB — URINALYSIS, ROUTINE W REFLEX MICROSCOPIC
Glucose, UA: 50 mg/dL — AB
Hgb urine dipstick: NEGATIVE
Ketones, ur: 5 mg/dL — AB
Nitrite: NEGATIVE
Protein, ur: >=300 mg/dL — AB
Specific Gravity, Urine: 1.035 — ABNORMAL HIGH (ref 1.005–1.030)
pH: 5 (ref 5.0–8.0)

## 2024-01-03 LAB — CBC WITH DIFFERENTIAL/PLATELET
Abs Immature Granulocytes: 0.11 10*3/uL — ABNORMAL HIGH (ref 0.00–0.07)
Basophils Absolute: 0 10*3/uL (ref 0.0–0.1)
Basophils Relative: 0 %
Eosinophils Absolute: 0 10*3/uL (ref 0.0–0.5)
Eosinophils Relative: 0 %
HCT: 37.4 % (ref 36.0–46.0)
Hemoglobin: 11.9 g/dL — ABNORMAL LOW (ref 12.0–15.0)
Immature Granulocytes: 1 %
Lymphocytes Relative: 4 %
Lymphs Abs: 0.6 10*3/uL — ABNORMAL LOW (ref 0.7–4.0)
MCH: 23.9 pg — ABNORMAL LOW (ref 26.0–34.0)
MCHC: 31.8 g/dL (ref 30.0–36.0)
MCV: 75.3 fL — ABNORMAL LOW (ref 80.0–100.0)
Monocytes Absolute: 0.1 10*3/uL (ref 0.1–1.0)
Monocytes Relative: 0 %
Neutro Abs: 13.8 10*3/uL — ABNORMAL HIGH (ref 1.7–7.7)
Neutrophils Relative %: 95 %
Platelets: 235 10*3/uL (ref 150–400)
RBC: 4.97 MIL/uL (ref 3.87–5.11)
RDW: 14.5 % (ref 11.5–15.5)
Smear Review: NORMAL
WBC: 14.6 10*3/uL — ABNORMAL HIGH (ref 4.0–10.5)
nRBC: 0 % (ref 0.0–0.2)

## 2024-01-03 LAB — I-STAT CHEM 8, ED
BUN: 20 mg/dL (ref 6–20)
Calcium, Ion: 1.06 mmol/L — ABNORMAL LOW (ref 1.15–1.40)
Chloride: 101 mmol/L (ref 98–111)
Creatinine, Ser: 1.5 mg/dL — ABNORMAL HIGH (ref 0.44–1.00)
Glucose, Bld: 93 mg/dL (ref 70–99)
HCT: 38 % (ref 36.0–46.0)
Hemoglobin: 12.9 g/dL (ref 12.0–15.0)
Potassium: 2.7 mmol/L — CL (ref 3.5–5.1)
Sodium: 140 mmol/L (ref 135–145)
TCO2: 18 mmol/L — ABNORMAL LOW (ref 22–32)

## 2024-01-03 LAB — HEPATIC FUNCTION PANEL
ALT: 75 U/L — ABNORMAL HIGH (ref 0–44)
AST: 185 U/L — ABNORMAL HIGH (ref 15–41)
Albumin: 3.6 g/dL (ref 3.5–5.0)
Alkaline Phosphatase: 173 U/L — ABNORMAL HIGH (ref 38–126)
Bilirubin, Direct: 0.4 mg/dL — ABNORMAL HIGH (ref 0.0–0.2)
Indirect Bilirubin: 0.9 mg/dL (ref 0.3–0.9)
Total Bilirubin: 1.3 mg/dL — ABNORMAL HIGH (ref 0.0–1.2)
Total Protein: 7.9 g/dL (ref 6.5–8.1)

## 2024-01-03 LAB — HCG, QUANTITATIVE, PREGNANCY: hCG, Beta Chain, Quant, S: <1 m[IU]/mL (ref <5)

## 2024-01-03 LAB — ETHANOL: Alcohol, Ethyl (B): <15 mg/dL (ref <15)

## 2024-01-03 MED ORDER — SODIUM CHLORIDE 0.9 % IV BOLUS
2000.0000 mL | Freq: Once | INTRAVENOUS | Status: AC
  Administered 2024-01-03: 2000 mL via INTRAVENOUS

## 2024-01-03 MED ORDER — STERILE WATER FOR INJECTION IJ SOLN
INTRAMUSCULAR | Status: AC
  Filled 2024-01-03: qty 10

## 2024-01-03 MED ORDER — ONDANSETRON HCL 4 MG/2ML IJ SOLN
4.0000 mg | Freq: Once | INTRAMUSCULAR | Status: AC
  Administered 2024-01-03: 4 mg via INTRAVENOUS
  Filled 2024-01-03: qty 2

## 2024-01-03 MED ORDER — POTASSIUM CHLORIDE 10 MEQ/100ML IV SOLN
10.0000 meq | Freq: Once | INTRAVENOUS | Status: AC
  Administered 2024-01-03: 10 meq via INTRAVENOUS
  Filled 2024-01-03: qty 100

## 2024-01-03 MED ORDER — ZIPRASIDONE MESYLATE 20 MG IM SOLR
10.0000 mg | Freq: Once | INTRAMUSCULAR | Status: AC
  Administered 2024-01-03: 10 mg via INTRAMUSCULAR
  Filled 2024-01-03: qty 20

## 2024-01-03 MED ORDER — IOHEXOL 300 MG/ML  SOLN
80.0000 mL | Freq: Once | INTRAMUSCULAR | Status: AC | PRN
  Administered 2024-01-03: 80 mL via INTRAVENOUS

## 2024-01-03 NOTE — ED Notes (Signed)
 Pt is refusing V/S to be taken until she is given pain medication. RN aware

## 2024-01-03 NOTE — ED Triage Notes (Addendum)
 Pt found behind gas station covered in feces and vomit. Pt just moaning during triage. Pt states she may be withdrawing from fentanyl .

## 2024-01-03 NOTE — ED Provider Notes (Signed)
 [place] EMERGENCY DEPARTMENT AT [facility] Provider Note   CSN: [id number] Arrival date & time: 01/03/24  1845     History {Add pertinent medical, surgical, social history, OB history to HPI:1} Chief Complaint  Patient presents with   Vomiting    Abigail [name] is a 33 y.o. female.  Patient has a history of opioid use.  She has been vomiting and was found outside behind a gas station with feces and vomit on her.  Patient complained of abdominal pain but was not cooperative with the nurses  The history is provided by the patient and medical records. No language interpreter was used.  Emesis Severity:  Moderate Timing:  Constant Quality:  Bilious material Able to tolerate:  Liquids Progression:  Worsening Chronicity:  New Recent urination:  Normal Context: not post-tussive   Relieved by:  Nothing Worsened by:  Nothing Ineffective treatments:  None tried Associated symptoms: abdominal pain   Associated symptoms: no cough, no diarrhea and no headaches        Home Medications Prior to Admission medications   Medication Sig Start Date End Date Taking? Authorizing Provider  cloNIDine  (CATAPRES ) 0.2 MG tablet Take 1 tablet (0.2 mg total) by mouth 2 (two) times daily as needed (withdrawal symptoms). 06/03/20 07/14/20  Ballard Bongo, MD      Allergies    Latex and Penicillins    Review of Systems   Review of Systems  Constitutional:  Negative for appetite change and fatigue.  HENT:  Negative for congestion, ear discharge and sinus pressure.   Eyes:  Negative for discharge.  Respiratory:  Negative for cough.   Cardiovascular:  Negative for chest pain.  Gastrointestinal:  Positive for abdominal pain and vomiting. Negative for diarrhea.  Genitourinary:  Negative for frequency and hematuria.  Musculoskeletal:  Negative for back pain.  Skin:  Negative for rash.  Neurological:  Negative for seizures and headaches.  Psychiatric/Behavioral:  Negative  for hallucinations.     Physical Exam Updated Vital Signs BP 110/74   Pulse 97   Temp 97.9 F (36.6 C)   Resp (!) 29   Ht 5\' 3"  (1.6 m)   Wt 71 kg   LMP 11/16/2023 (Approximate)   SpO2 97%   BMI 27.73 kg/m  Physical Exam Vitals and nursing note reviewed.  Constitutional:      Appearance: She is well-developed.  HENT:     Head: Normocephalic.     Nose: Nose normal.  Eyes:     General: No scleral icterus.    Conjunctiva/sclera: Conjunctivae normal.  Neck:     Thyroid: No thyromegaly.  Cardiovascular:     Rate and Rhythm: Normal rate and regular rhythm.     Heart sounds: No murmur heard.    No friction rub. No gallop.  Pulmonary:     Breath sounds: No stridor. No wheezing or rales.  Chest:     Chest wall: No tenderness.  Abdominal:     General: There is no distension.     Tenderness: There is abdominal tenderness. There is no rebound.  Musculoskeletal:        General: Normal range of motion.     Cervical back: Neck supple.  Lymphadenopathy:     Cervical: No cervical adenopathy.  Skin:    Findings: No erythema or rash.  Neurological:     Mental Status: She is alert and oriented to person, place, and time.     Motor: No abnormal muscle tone.  Coordination: Coordination normal.  Psychiatric:        Behavior: Behavior normal.     ED Results / Procedures / Treatments   Labs (all labs ordered are listed, but only abnormal results are displayed) Labs Reviewed  CBC WITH DIFFERENTIAL/PLATELET - Abnormal; Notable for the following components:      Result Value   WBC 14.6 (*)    Hemoglobin 11.9 (*)    MCV 75.3 (*)    MCH 23.9 (*)    Neutro Abs 13.8 (*)    Lymphs Abs 0.6 (*)    Abs Immature Granulocytes 0.11 (*)    All other components within normal limits  HEPATIC FUNCTION PANEL - Abnormal; Notable for the following components:   AST 185 (*)    ALT 75 (*)    Alkaline Phosphatase 173 (*)    Total Bilirubin 1.3 (*)    Bilirubin, Direct 0.4 (*)    All  other components within normal limits  I-STAT CHEM 8, ED - Abnormal; Notable for the following components:   Potassium 2.7 (*)    Creatinine, Ser 1.50 (*)    Calcium, Ion 1.06 (*)    TCO2 18 (*)    All other components within normal limits  ETHANOL  HCG, QUANTITATIVE, PREGNANCY  URINALYSIS, ROUTINE W REFLEX MICROSCOPIC  RAPID URINE DRUG SCREEN, HOSP PERFORMED    EKG None  Radiology DG Chest Port 1 View Result Date: 01/03/2024 CLINICAL DATA:  [place] covered in feces and vomit. EXAM: PORTABLE CHEST 1 VIEW COMPARISON:  09/06/2023 FINDINGS: Stable cardiomediastinal silhouette. Question airspace opacity in the right lower lung medially. No pleural effusion or pneumothorax. No displaced rib fractures. IMPRESSION: Question airspace opacity in the right lower lung medially which could be due to confluence of shadows, pneumonia, or aspiration. Electronically Signed   By: Rozell Cornet M.D.   On: 01/03/2024 20:21    Procedures Procedures  {Document cardiac monitor, telemetry assessment procedure when appropriate:1}  Medications Ordered in ED Medications  sodium chloride  0.9 % bolus 2,000 mL (0 mLs Intravenous Stopped 01/03/24 2107)  ondansetron  (ZOFRAN ) injection 4 mg (4 mg Intravenous Given 01/03/24 1924)  ziprasidone (GEODON) injection 10 mg (10 mg Intramuscular Given 01/03/24 1924)  sterile water  (preservative free) injection (  Given 01/03/24 1927)  potassium chloride  10 mEq in 100 mL IVPB (10 mEq Intravenous New Bag/Given 01/03/24 2107)  potassium chloride  10 mEq in 100 mL IVPB (0 mEq Intravenous Stopped 01/03/24 2108)  iohexol  (OMNIPAQUE ) 300 MG/ML solution 80 mL (80 mLs Intravenous Contrast Given 01/03/24 2305)    ED Course/ Medical Decision Making/ A&P   {  CRITICAL CARE Performed by: Cheyenne Cotta Total critical care time: 44 minutes Critical care time was exclusive of separately billable procedures and treating other patients. Critical care was necessary to  treat or prevent imminent or life-threatening deterioration. Critical care was time spent personally by me on the following activities: development of treatment plan with patient and/or surrogate as well as nursing, discussions with consultants, evaluation of patient's response to treatment, examination of patient, obtaining history from patient or surrogate, ordering and performing treatments and interventions, ordering and review of laboratory studies, ordering and review of radiographic studies, pulse oximetry and re-evaluation of patient's condition.   Patient was yelling at the nurses and did not want to allow vital signs to be taken or lab work done until she got some pain medicine.  She was given some Geodon and then she became cooperative Click here for  ABCD2, HEART and other calculatorsREFRESH Note before signing :1}                              Medical Decision Making Amount and/or Complexity of Data Reviewed Labs: ordered. Radiology: ordered.  Risk Prescription drug management.   Abdominal pain with vomiting.  CT scan pending  {Document critical care time when appropriate:1} {Document review of labs and clinical decision tools ie heart score, Chads2Vasc2 etc:1}  {Document your independent review of radiology images, and any outside records:1} {Document your discussion with family members, caretakers, and with consultants:1} {Document social determinants of health affecting pt's care:1} {Document your decision making why or why not admission, treatments were needed:1} Final Clinical Impression(s) / ED Diagnoses Final diagnoses:  None    Rx / DC Orders ED Discharge Orders     None

## 2024-01-03 NOTE — ED Provider Notes (Signed)
 Care assumed from Dr. Zammitt, patient with generalized abdominal pain pending CT of abdomen and pelvis, also pending UA.  2:42 AM CT scan showed some small bowel thickening concerning for enterocolitis versus ischemia with recommendation to correlate with lactic acid level.  I have independently viewed the images, and agree with radiologist's interpretation.  Urinalysis showed no evidence of infection.  I have gone in to talk with her, but she is still too somnolent to be discharged.  On exam, there is minimal abdominal tenderness.  She will need to be observed until she is awake enough for discharge.  5:28 AM Patient is now easily arousable.  Abdominal exam is benign.  I will discharge her with prescriptions for ciprofloxacin and metronidazole .  Advised to avoid use of all illicit drugs.  She was also severely hypokalemic, I have ordered a prescription for oral potassium.  She is also complaining of nausea, I am including a prescription for ondansetron  oral dissolving tablet.  Results for orders placed or performed during the hospital encounter of 01/03/24  CBC with Differential   Collection Time: 01/03/24  7:28 PM  Result Value Ref Range   WBC 14.6 (H) 4.0 - 10.5 K/uL   RBC 4.97 3.87 - 5.11 MIL/uL   Hemoglobin 11.9 (L) 12.0 - 15.0 g/dL   HCT 16.1 09.6 - 04.5 %   MCV 75.3 (L) 80.0 - 100.0 fL   MCH 23.9 (L) 26.0 - 34.0 pg   MCHC 31.8 30.0 - 36.0 g/dL   RDW 40.9 81.1 - 91.4 %   Platelets 235 150 - 400 K/uL   nRBC 0.0 0.0 - 0.2 %   Neutrophils Relative % 95 %   Neutro Abs 13.8 (H) 1.7 - 7.7 K/uL   Lymphocytes Relative 4 %   Lymphs Abs 0.6 (L) 0.7 - 4.0 K/uL   Monocytes Relative 0 %   Monocytes Absolute 0.1 0.1 - 1.0 K/uL   Eosinophils Relative 0 %   Eosinophils Absolute 0.0 0.0 - 0.5 K/uL   Basophils Relative 0 %   Basophils Absolute 0.0 0.0 - 0.1 K/uL   WBC Morphology MORPHOLOGY UNREMARKABLE    RBC Morphology MORPHOLOGY UNREMARKABLE    Smear Review Normal platelet morphology     Immature Granulocytes 1 %   Abs Immature Granulocytes 0.11 (H) 0.00 - 0.07 K/uL  Hepatic function panel   Collection Time: 01/03/24  7:28 PM  Result Value Ref Range   Total Protein 7.9 6.5 - 8.1 g/dL   Albumin 3.6 3.5 - 5.0 g/dL   AST 782 (H) 15 - 41 U/L   ALT 75 (H) 0 - 44 U/L   Alkaline Phosphatase 173 (H) 38 - 126 U/L   Total Bilirubin 1.3 (H) 0.0 - 1.2 mg/dL   Bilirubin, Direct 0.4 (H) 0.0 - 0.2 mg/dL   Indirect Bilirubin 0.9 0.3 - 0.9 mg/dL  Ethanol   Collection Time: 01/03/24  7:28 PM  Result Value Ref Range   Alcohol, Ethyl (B) <15 <15 mg/dL  hCG, quantitative, pregnancy   Collection Time: 01/03/24  7:28 PM  Result Value Ref Range   hCG, Beta Chain, Quant, S <1 <5 mIU/mL  I-stat chem 8, ED (not at [facility], [facility] or [facility])   Collection Time: 01/03/24  7:33 PM  Result Value Ref Range   Sodium 140 135 - 145 mmol/L   Potassium 2.7 (LL) 3.5 - 5.1 mmol/L   Chloride 101 98 - 111 mmol/L   BUN 20 6 - 20 mg/dL   Creatinine, Ser 9.56 (  H) 0.44 - 1.00 mg/dL   Glucose, Bld 93 70 - 99 mg/dL   Calcium, Ion 1.61 (L) 1.15 - 1.40 mmol/L   TCO2 18 (L) 22 - 32 mmol/L   Hemoglobin 12.9 12.0 - 15.0 g/dL   HCT 09.6 04.5 - 40.9 %   Comment NOTIFIED PHYSICIAN   Urinalysis, Routine w reflex microscopic -Urine, Clean Catch   Collection Time: 01/03/24 11:22 PM  Result Value Ref Range   Color, Urine AMBER (A) YELLOW   APPearance CLOUDY (A) CLEAR   Specific Gravity, Urine 1.035 (H) 1.005 - 1.030   pH 5.0 5.0 - 8.0   Glucose, UA 50 (A) NEGATIVE mg/dL   Hgb urine dipstick NEGATIVE NEGATIVE   Bilirubin Urine SMALL (A) NEGATIVE   Ketones, ur 5 (A) NEGATIVE mg/dL   Protein, ur >=811 (A) NEGATIVE mg/dL   Nitrite NEGATIVE NEGATIVE   Leukocytes,Ua TRACE (A) NEGATIVE   RBC / HPF 0-5 0 - 5 RBC/hpf   WBC, UA 11-20 0 - 5 WBC/hpf   Bacteria, UA RARE (A) NONE SEEN   Squamous Epithelial / HPF 6-10 0 - 5 /HPF   Mucus PRESENT   Rapid urine drug screen (hospital performed)   Collection Time: 01/03/24 11:22 PM   Result Value Ref Range   Opiates NONE DETECTED NONE DETECTED   Cocaine POSITIVE (A) NONE DETECTED   Benzodiazepines NONE DETECTED NONE DETECTED   Amphetamines POSITIVE (A) NONE DETECTED   Tetrahydrocannabinol NONE DETECTED NONE DETECTED   Barbiturates NONE DETECTED NONE DETECTED  Lactic acid, plasma   Collection Time: 01/04/24  1:58 AM  Result Value Ref Range   Lactic Acid, Venous 1.9 0.5 - 1.9 mmol/L   CT ABDOMEN PELVIS W CONTRAST Result Date: 01/03/2024 CLINICAL DATA:  Acute nonlocalized abdominal pain. Found behind gas station in feces and vomit. Possible fentanyl  withdrawal EXAM: CT ABDOMEN AND PELVIS WITH CONTRAST TECHNIQUE: Multidetector CT imaging of the abdomen and pelvis was performed using the standard protocol following bolus administration of intravenous contrast. RADIATION DOSE REDUCTION: This exam was performed according to the departmental dose-optimization program which includes automated exposure control, adjustment of the mA and/or kV according to patient size and/or use of iterative reconstruction technique. CONTRAST:  80mL OMNIPAQUE  IOHEXOL  300 MG/ML  SOLN COMPARISON:  CT abdomen pelvis 10/07/2023 FINDINGS: Lower chest: No acute abnormality. No correlate for questionable airspace opacity on same day chest radiographs. Hepatobiliary: Periportal edema. Gallbladder and biliary tree are unremarkable. Pancreas: Unremarkable. Spleen: Unremarkable. Adrenals/Urinary Tract: Edema surrounding both adrenal glands is new compared to 10/07/2023. Contrast within the bilateral renal collecting systems and posterior bladder. No hydronephrosis. Stomach/Bowel: Normal caliber large and small bowel. Stomach is within normal limits. Small bowel wall thickening in the left hemiabdomen. Question additional wall thickening of the colon versus underdistention. Normal appendix. Vascular/Lymphatic: No significant vascular findings are present. No enlarged abdominal or pelvic lymph nodes. Reproductive: Uterus  and bilateral adnexa are unremarkable. Other: No free intraperitoneal air. No free intraperitoneal fluid or abscess. A compression Musculoskeletal: No acute fracture IMPRESSION: 1. Small bowel wall thickening in the left hemiabdomen. Question additional wall thickening of the colon versus underdistention. Findings are concerning for enterocolitis. Ischemia is difficult to exclude. Recommend correlation with lactate. 2. Nonspecific edema surrounding both adrenal glands is new compared to 10/07/2023 possibly due to stress reaction. Consider correlation with adrenal hormones. 3. Periportal edema likely related to volume status. Electronically Signed   By: Rozell Cornet M.D.   On: 01/03/2024 23:28   DG Chest [facility] [address]  Result Date: 01/03/2024 CLINICAL DATA:  Found [place] station covered in feces and vomit. EXAM: PORTABLE CHEST 1 VIEW COMPARISON:  09/06/2023 FINDINGS: Stable cardiomediastinal silhouette. Question airspace opacity in the right lower lung medially. No pleural effusion or pneumothorax. No displaced rib fractures. IMPRESSION: Question airspace opacity in the right lower lung medially which could be due to confluence of shadows, pneumonia, or aspiration. Electronically Signed   By: Rozell Cornet M.D.   On: 01/03/2024 20:21      Alissa April, MD 01/04/24 [phone]

## 2024-01-04 LAB — LACTIC ACID, PLASMA: Lactic Acid, Venous: 1.9 mmol/L (ref 0.5–1.9)

## 2024-01-04 MED ORDER — CIPROFLOXACIN HCL 500 MG PO TABS: 500.0000 mg | ORAL_TABLET | Freq: Two times a day (BID) | ORAL | 0 refills | Status: AC

## 2024-01-04 MED ORDER — METRONIDAZOLE 500 MG PO TABS: 500.0000 mg | ORAL_TABLET | Freq: Three times a day (TID) | ORAL | 0 refills | Status: AC

## 2024-01-04 MED ORDER — POTASSIUM CHLORIDE CRYS ER 20 MEQ PO TBCR: 20.0000 meq | EXTENDED_RELEASE_TABLET | Freq: Three times a day (TID) | ORAL | 0 refills | Status: AC

## 2024-01-04 MED ORDER — METRONIDAZOLE 500 MG PO TABS
500.0000 mg | ORAL_TABLET | Freq: Once | ORAL | Status: AC
  Administered 2024-01-04: 500 mg via ORAL
  Filled 2024-01-04: qty 1

## 2024-01-04 MED ORDER — ONDANSETRON HCL 4 MG/2ML IJ SOLN
4.0000 mg | Freq: Once | INTRAMUSCULAR | Status: AC
  Administered 2024-01-04: 4 mg via INTRAVENOUS
  Filled 2024-01-04: qty 2

## 2024-01-04 MED ORDER — CIPROFLOXACIN HCL 250 MG PO TABS
500.0000 mg | ORAL_TABLET | Freq: Once | ORAL | Status: AC
  Administered 2024-01-04: 500 mg via ORAL
  Filled 2024-01-04: qty 2

## 2024-01-04 MED ORDER — ONDANSETRON 4 MG PO TBDP: 4.0000 mg | ORAL_TABLET | Freq: Three times a day (TID) | ORAL | 0 refills | Status: AC | PRN

## 2024-01-04 MED ORDER — POTASSIUM CHLORIDE CRYS ER 20 MEQ PO TBCR
40.0000 meq | EXTENDED_RELEASE_TABLET | Freq: Once | ORAL | Status: AC
  Administered 2024-01-04: 40 meq via ORAL
  Filled 2024-01-04: qty 2

## 2024-02-14 ENCOUNTER — Emergency Department (HOSPITAL_COMMUNITY): Payer: MEDICAID

## 2024-02-14 ENCOUNTER — Observation Stay (HOSPITAL_COMMUNITY)
Admission: EM | Admit: 2024-02-14 | Discharge: 2024-02-15 | Discharge status: Against Medical Advice | Payer: MEDICAID | Attending: Internal Medicine | Admitting: Internal Medicine

## 2024-02-14 ENCOUNTER — Other Ambulatory Visit: Payer: Self-pay

## 2024-02-14 ENCOUNTER — Encounter (HOSPITAL_COMMUNITY): Payer: Self-pay | Admitting: Internal Medicine

## 2024-02-14 DIAGNOSIS — R112 Nausea with vomiting, unspecified: Principal | ICD-10-CM | POA: Diagnosis present

## 2024-02-14 DIAGNOSIS — F10920 Alcohol use, unspecified with intoxication, uncomplicated: Secondary | ICD-10-CM | POA: Diagnosis not present

## 2024-02-14 DIAGNOSIS — K529 Noninfective gastroenteritis and colitis, unspecified: Secondary | ICD-10-CM | POA: Insufficient documentation

## 2024-02-14 DIAGNOSIS — F32A Depression, unspecified: Secondary | ICD-10-CM | POA: Insufficient documentation

## 2024-02-14 DIAGNOSIS — F1994 Other psychoactive substance use, unspecified with psychoactive substance-induced mood disorder: Secondary | ICD-10-CM | POA: Diagnosis present

## 2024-02-14 DIAGNOSIS — F1910 Other psychoactive substance abuse, uncomplicated: Secondary | ICD-10-CM | POA: Diagnosis present

## 2024-02-14 DIAGNOSIS — R109 Unspecified abdominal pain: Secondary | ICD-10-CM | POA: Diagnosis present

## 2024-02-14 DIAGNOSIS — F17210 Nicotine dependence, cigarettes, uncomplicated: Secondary | ICD-10-CM | POA: Insufficient documentation

## 2024-02-14 DIAGNOSIS — F419 Anxiety disorder, unspecified: Secondary | ICD-10-CM | POA: Insufficient documentation

## 2024-02-14 DIAGNOSIS — F1914 Other psychoactive substance abuse with psychoactive substance-induced mood disorder: Secondary | ICD-10-CM | POA: Insufficient documentation

## 2024-02-14 DIAGNOSIS — F17220 Nicotine dependence, chewing tobacco, uncomplicated: Secondary | ICD-10-CM | POA: Diagnosis not present

## 2024-02-14 DIAGNOSIS — G9340 Encephalopathy, unspecified: Secondary | ICD-10-CM | POA: Insufficient documentation

## 2024-02-14 DIAGNOSIS — R1013 Epigastric pain: Principal | ICD-10-CM

## 2024-02-14 DIAGNOSIS — Z91040 Latex allergy status: Secondary | ICD-10-CM | POA: Insufficient documentation

## 2024-02-14 DIAGNOSIS — Z1152 Encounter for screening for COVID-19: Secondary | ICD-10-CM | POA: Insufficient documentation

## 2024-02-14 DIAGNOSIS — F12920 Cannabis use, unspecified with intoxication, uncomplicated: Secondary | ICD-10-CM | POA: Insufficient documentation

## 2024-02-14 LAB — RESP PANEL BY RT-PCR (RSV, FLU A&B, COVID)  RVPGX2
Influenza A by PCR: NEGATIVE
Influenza B by PCR: NEGATIVE
Resp Syncytial Virus by PCR: NEGATIVE
SARS Coronavirus 2 by RT PCR: NEGATIVE

## 2024-02-14 LAB — COMPREHENSIVE METABOLIC PANEL WITH GFR
ALT: 24 U/L (ref 0–44)
AST: 27 U/L (ref 15–41)
Albumin: 4.4 g/dL (ref 3.5–5.0)
Alkaline Phosphatase: 91 U/L (ref 38–126)
Anion gap: 16 — ABNORMAL HIGH (ref 5–15)
BUN: 11 mg/dL (ref 6–20)
CO2: 22 mmol/L (ref 22–32)
Calcium: 9.4 mg/dL (ref 8.9–10.3)
Chloride: 100 mmol/L (ref 98–111)
Creatinine, Ser: 0.84 mg/dL (ref 0.44–1.00)
GFR, Estimated: >60 mL/min (ref >60)
Glucose, Bld: 112 mg/dL — ABNORMAL HIGH (ref 70–99)
Potassium: 3.9 mmol/L (ref 3.5–5.1)
Sodium: 138 mmol/L (ref 135–145)
Total Bilirubin: 0.9 mg/dL (ref 0.0–1.2)
Total Protein: 9.4 g/dL — ABNORMAL HIGH (ref 6.5–8.1)

## 2024-02-14 LAB — POC URINE PREG, ED: Preg Test, Ur: NEGATIVE

## 2024-02-14 LAB — RAPID URINE DRUG SCREEN, HOSP PERFORMED
Amphetamines: NOT DETECTED
Barbiturates: NOT DETECTED
Benzodiazepines: NOT DETECTED
Cocaine: POSITIVE — AB
Opiates: NOT DETECTED
Tetrahydrocannabinol: NOT DETECTED

## 2024-02-14 LAB — CBC
HCT: 44.4 % (ref 36.0–46.0)
Hemoglobin: 13.6 g/dL (ref 12.0–15.0)
MCH: 24.2 pg — ABNORMAL LOW (ref 26.0–34.0)
MCHC: 30.6 g/dL (ref 30.0–36.0)
MCV: 78.9 fL — ABNORMAL LOW (ref 80.0–100.0)
Platelets: 286 K/uL (ref 150–400)
RBC: 5.63 MIL/uL — ABNORMAL HIGH (ref 3.87–5.11)
RDW: 15.9 % — ABNORMAL HIGH (ref 11.5–15.5)
WBC: 9.3 K/uL (ref 4.0–10.5)
nRBC: 0 % (ref 0.0–0.2)

## 2024-02-14 LAB — URINALYSIS, ROUTINE W REFLEX MICROSCOPIC
Bilirubin Urine: NEGATIVE
Glucose, UA: NEGATIVE mg/dL
Hgb urine dipstick: NEGATIVE
Ketones, ur: NEGATIVE mg/dL
Leukocytes,Ua: NEGATIVE
Nitrite: NEGATIVE
Protein, ur: NEGATIVE mg/dL
Specific Gravity, Urine: 1.011 (ref 1.005–1.030)
pH: 8 (ref 5.0–8.0)

## 2024-02-14 LAB — LACTIC ACID, PLASMA
Lactic Acid, Venous: 2 mmol/L (ref 0.5–1.9)
Lactic Acid, Venous: 0.9 mmol/L (ref 0.5–1.9)

## 2024-02-14 LAB — LIPASE, BLOOD: Lipase: 28 U/L (ref 11–51)

## 2024-02-14 LAB — PREGNANCY, URINE: Preg Test, Ur: NEGATIVE

## 2024-02-14 LAB — ETHANOL: Alcohol, Ethyl (B): <15 mg/dL (ref <15)

## 2024-02-14 MED ORDER — ACETAMINOPHEN 325 MG PO TABS
650.0000 mg | ORAL_TABLET | Freq: Once | ORAL | Status: AC
  Administered 2024-02-14: 650 mg via ORAL
  Filled 2024-02-14: qty 2

## 2024-02-14 MED ORDER — LACTATED RINGERS IV BOLUS
1000.0000 mL | Freq: Once | INTRAVENOUS | Status: AC
  Administered 2024-02-14: 1000 mL via INTRAVENOUS

## 2024-02-14 MED ORDER — ENOXAPARIN SODIUM 40 MG/0.4ML IJ SOSY
40.0000 mg | PREFILLED_SYRINGE | INTRAMUSCULAR | Status: DC
  Administered 2024-02-14: 40 mg via SUBCUTANEOUS
  Filled 2024-02-14: qty 0.4

## 2024-02-14 MED ORDER — LORAZEPAM 2 MG/ML IJ SOLN
1.0000 mg | Freq: Once | INTRAMUSCULAR | Status: AC
  Administered 2024-02-14: 1 mg via INTRAVENOUS

## 2024-02-14 MED ORDER — ACETAMINOPHEN 650 MG RE SUPP: 650.0000 mg | Freq: Four times a day (QID) | RECTAL | Status: DC | PRN

## 2024-02-14 MED ORDER — PANTOPRAZOLE SODIUM 40 MG IV SOLR
40.0000 mg | INTRAVENOUS | Status: DC
  Administered 2024-02-14: 40 mg via INTRAVENOUS
  Filled 2024-02-14: qty 10

## 2024-02-14 MED ORDER — ONDANSETRON HCL 4 MG/2ML IJ SOLN: 4.0000 mg | Freq: Four times a day (QID) | INTRAMUSCULAR | Status: DC | PRN

## 2024-02-14 MED ORDER — CIPROFLOXACIN IN D5W 400 MG/200ML IV SOLN
400.0000 mg | Freq: Once | INTRAVENOUS | Status: AC
  Administered 2024-02-14: 400 mg via INTRAVENOUS
  Filled 2024-02-14: qty 200

## 2024-02-14 MED ORDER — ONDANSETRON HCL 4 MG/2ML IJ SOLN
4.0000 mg | Freq: Once | INTRAMUSCULAR | Status: AC
  Administered 2024-02-14: 4 mg via INTRAVENOUS
  Filled 2024-02-14: qty 2

## 2024-02-14 MED ORDER — METRONIDAZOLE 500 MG/100ML IV SOLN
500.0000 mg | Freq: Once | INTRAVENOUS | Status: AC
  Administered 2024-02-14: 500 mg via INTRAVENOUS
  Filled 2024-02-14: qty 100

## 2024-02-14 MED ORDER — KETOROLAC TROMETHAMINE 30 MG/ML IJ SOLN
30.0000 mg | Freq: Once | INTRAMUSCULAR | Status: AC
  Administered 2024-02-14: 30 mg via INTRAVENOUS
  Filled 2024-02-14: qty 1

## 2024-02-14 MED ORDER — LACTATED RINGERS IV SOLN: INTRAVENOUS | Status: DC

## 2024-02-14 MED ORDER — IOHEXOL 300 MG/ML  SOLN
100.0000 mL | Freq: Once | INTRAMUSCULAR | Status: AC | PRN
  Administered 2024-02-14: 100 mL via INTRAVENOUS

## 2024-02-14 MED ORDER — FAMOTIDINE IN NACL 20-0.9 MG/50ML-% IV SOLN
20.0000 mg | Freq: Once | INTRAVENOUS | Status: AC
  Administered 2024-02-14: 20 mg via INTRAVENOUS
  Filled 2024-02-14: qty 50

## 2024-02-14 MED ORDER — PROCHLORPERAZINE EDISYLATE 10 MG/2ML IJ SOLN
10.0000 mg | Freq: Once | INTRAMUSCULAR | Status: AC
  Administered 2024-02-14: 10 mg via INTRAVENOUS
  Filled 2024-02-14: qty 2

## 2024-02-14 MED ORDER — LORAZEPAM 2 MG/ML IJ SOLN: 1.0000 mg | Freq: Once | INTRAMUSCULAR | Status: DC

## 2024-02-14 MED ORDER — HYDROMORPHONE HCL 1 MG/ML IJ SOLN
1.0000 mg | Freq: Once | INTRAMUSCULAR | Status: AC
  Administered 2024-02-14: 1 mg via INTRAVENOUS
  Filled 2024-02-14: qty 1

## 2024-02-14 MED ORDER — DIPHENHYDRAMINE HCL 50 MG/ML IJ SOLN
25.0000 mg | Freq: Once | INTRAMUSCULAR | Status: AC
  Administered 2024-02-14: 25 mg via INTRAVENOUS
  Filled 2024-02-14: qty 1

## 2024-02-14 MED ORDER — DROPERIDOL 2.5 MG/ML IJ SOLN
2.5000 mg | Freq: Once | INTRAMUSCULAR | Status: AC
  Administered 2024-02-14: 2.5 mg via INTRAVENOUS
  Filled 2024-02-14: qty 2

## 2024-02-14 MED ORDER — ACETAMINOPHEN 325 MG PO TABS
650.0000 mg | ORAL_TABLET | Freq: Four times a day (QID) | ORAL | Status: DC | PRN
  Administered 2024-02-15: 650 mg via ORAL
  Filled 2024-02-14: qty 2

## 2024-02-14 MED ORDER — LORAZEPAM 2 MG/ML IJ SOLN
INTRAMUSCULAR | Status: AC
  Filled 2024-02-14: qty 1

## 2024-02-14 NOTE — Plan of Care (Signed)

## 2024-02-14 NOTE — H&P (Signed)
 History and Physical    [name] FMW:[id number] DOB: August 23, 1990 DOA: 02/14/2024  PCP: Pcp, No   Patient coming from: Home  Chief Complaint: Abdominal pain with nausea and vomiting/diarrhea  HPI: Abigail [name] is a 33 y.o. female with medical history significant for history of polysubstance abuse with cocaine, tobacco abuse, and anxiety/depression who presented to the ED with complaints of abdominal pain along with nausea, vomiting, diarrhea for the last 1 week.  She appears to have been vomiting up some blood as well and has been defecating in her bed as she has lost control of her bowels since it is so watery.  She has noted to have prior history of opioid use disorder and appears to have been hospitalized previously on multiple occasions given withdrawal symptoms.   ED Course: Vital signs stable and patient afebrile with lactic acid level of approximately 2.  CT demonstrates some findings of colitis and enteritis.  Urine analysis negative.  Toxicology positive for cocaine.  Patient has received IV fluid boluses as well as multiple antiemetics.  She had some findings of confusion and CT head was done demonstrating no acute findings.  She was given some Ativan  with improvement.  Lipase is 28 and there is no LFT elevation and pregnancy test is normal.  Review of Systems: Could not be obtained given patient condition.  Past Medical History:  Diagnosis Date   Anemia    Anxiety    Bipolar 1 disorder (HCC)    BV (bacterial vaginosis) 07/17/2015   Chronic back pain    Depression    Drug abuse (HCC) 07/17/2015   Fracture, ribs leftside   Liver laceration    Pneumothorax left   Polysubstance abuse (HCC)    opiods, cocaine, marijuana, heroin   PTSD (post-traumatic stress disorder)    PTSD (post-traumatic stress disorder)    Substance induced mood disorder (HCC)     Past Surgical History:  Procedure Laterality Date   ADENOIDECTOMY     BLADDER SURGERY     urethral dilatation  and bladder dilitation   ORIF ANKLE FRACTURE Right 09/01/2015   Procedure: OPEN REDUCTION INTERNAL FIXATION (ORIF)  OPEN FOOT FRACTURES DISLOCATION;  Surgeon: Ozell Bruch, MD;  Location: [facility] OR;  Service: Orthopedics;  Laterality: Right;   TONSILLECTOMY       reports that she has been smoking cigarettes. She started smoking about 17 years ago. She has a 9 pack-year smoking history. She has never used smokeless tobacco. She reports that she does not currently use alcohol after a past usage of about 6.0 - 8.0 standard drinks of alcohol per week. She reports that she does not currently use drugs after having used the following drugs: Marijuana and Methamphetamines.  Allergies  Allergen Reactions   Latex Swelling   Penicillins Other (See Comments)    REACTION: Childhood allergy/unknown reaction Has patient had a PCN reaction causing immediate rash, facial/tongue/throat swelling, SOB or lightheadedness with Has patient had a PCN reaction causing severe rash involving mucus membranes or skin necrosis: NO Has patient had a PCN reaction that required hospitalization NO Has patient had a PCN reaction occurring within the last 10 years: NO If all of the above answers are NO, then may proceed with Cephalosporin use.    Family History  Problem Relation Age of Onset   Anxiety disorder Mother    Miscarriages / Stillbirths Mother    Hypertension Father    Heart disease Father    Depression Father    Bipolar disorder  Father    Thyroid disease Father    Anxiety disorder Father    Early death Father    Depression Brother    Heart disease Brother    Hypertension Brother    Diabetes Brother        borderline   Anxiety disorder Brother    Bipolar disorder Brother    Thyroid disease Brother    Diabetes Maternal Grandmother    Heart disease Maternal Grandmother    Diabetes Maternal Grandfather    Heart disease Maternal Grandfather     Prior to Admission medications   Medication Sig Start Date  End Date Taking? Authorizing Provider  ciprofloxacin  (CIPRO ) 500 MG tablet Take 1 tablet (500 mg total) by mouth 2 (two) times daily. 01/04/24   Raford Lenis, MD  metroNIDAZOLE  (FLAGYL ) 500 MG tablet Take 1 tablet (500 mg total) by mouth 3 (three) times daily. 01/04/24   Raford Lenis, MD  ondansetron  (ZOFRAN -ODT) 4 MG disintegrating tablet Take 1 tablet (4 mg total) by mouth every 8 (eight) hours as needed for nausea or vomiting. 01/04/24   Raford Lenis, MD  potassium chloride  SA (KLOR-CON  M) 20 MEQ tablet Take 1 tablet (20 mEq total) by mouth 3 (three) times daily. 01/04/24   Raford Lenis, MD  cloNIDine  (CATAPRES ) 0.2 MG tablet Take 1 tablet (0.2 mg total) by mouth 2 (two) times daily as needed (withdrawal symptoms). 06/03/20 07/14/20  Haze Lonni PARAS, MD    Physical Exam: Vitals:   02/14/24 1200 02/14/24 1230 02/14/24 1245 02/14/24 1400  BP: 120/62 124/72 (!) 115/53 135/78  Pulse: 73 75 81 66  Resp: (!) 22 19 (!) 21 18  Temp:    98.5 F (36.9 C)  TempSrc:    Oral  SpO2: 100% 98% 97% 100%  Weight:    66.5 kg  Height:    5' 3 (1.6 m)    Constitutional: NAD, calm, comfortable Vitals:   02/14/24 1200 02/14/24 1230 02/14/24 1245 02/14/24 1400  BP: 120/62 124/72 (!) 115/53 135/78  Pulse: 73 75 81 66  Resp: (!) 22 19 (!) 21 18  Temp:    98.5 F (36.9 C)  TempSrc:    Oral  SpO2: 100% 98% 97% 100%  Weight:    66.5 kg  Height:    5' 3 (1.6 m)   Eyes: lids and conjunctivae normal Neck: normal, supple Respiratory: clear to auscultation bilaterally. Normal respiratory effort. No accessory muscle use.  Cardiovascular: Regular rate and rhythm, no murmurs. Abdomen: no tenderness, no distention. Bowel sounds positive.  Musculoskeletal:  No edema. Skin: no rashes, lesions, ulcers.  Psychiatric: Flat affect  Labs on Admission: I have personally reviewed following labs and imaging studies  CBC: Recent Labs  Lab 02/14/24 0536  WBC 9.3  HGB 13.6  HCT 44.4  MCV 78.9*  PLT 286    Basic Metabolic Panel: Recent Labs  Lab 02/14/24 0536  NA 138  K 3.9  CL 100  CO2 22  GLUCOSE 112*  BUN 11  CREATININE 0.84  CALCIUM 9.4   GFR: Estimated Creatinine Clearance: 88.9 mL/min (by C-G formula based on SCr of 0.84 mg/dL). Liver Function Tests: Recent Labs  Lab 02/14/24 0536  AST 27  ALT 24  ALKPHOS 91  BILITOT 0.9  PROT 9.4*  ALBUMIN 4.4   Recent Labs  Lab 02/14/24 0536  LIPASE 28   No results for input(s): AMMONIA in the last 168 hours. Coagulation Profile: No results for input(s): INR, PROTIME in the last 168 hours.  Cardiac Enzymes: No results for input(s): CKTOTAL, CKMB, CKMBINDEX, TROPONINI in the last 168 hours. BNP (last 3 results) No results for input(s): PROBNP in the last 8760 hours. HbA1C: No results for input(s): HGBA1C in the last 72 hours. CBG: No results for input(s): GLUCAP in the last 168 hours. Lipid Profile: No results for input(s): CHOL, HDL, LDLCALC, TRIG, CHOLHDL, LDLDIRECT in the last 72 hours. Thyroid Function Tests: No results for input(s): TSH, T4TOTAL, FREET4, T3FREE, THYROIDAB in the last 72 hours. Anemia Panel: No results for input(s): VITAMINB12, FOLATE, FERRITIN, TIBC, IRON , RETICCTPCT in the last 72 hours. Urine analysis:    Component Value Date/Time   COLORURINE YELLOW 02/14/2024 0625   APPEARANCEUR CLEAR 02/14/2024 0625   APPEARANCEUR Cloudy (A) 07/17/2015 1631   LABSPEC 1.011 02/14/2024 0625   PHURINE 8.0 02/14/2024 0625   GLUCOSEU NEGATIVE 02/14/2024 0625   HGBUR NEGATIVE 02/14/2024 0625   BILIRUBINUR NEGATIVE 02/14/2024 0625   BILIRUBINUR Negative 07/17/2015 1631   KETONESUR NEGATIVE 02/14/2024 0625   PROTEINUR NEGATIVE 02/14/2024 0625   UROBILINOGEN 0.2 01/28/2016 1140   NITRITE NEGATIVE 02/14/2024 0625   LEUKOCYTESUR NEGATIVE 02/14/2024 0625    Radiological Exams on Admission: CT Head Wo Contrast Result Date: 02/14/2024 CLINICAL DATA:  Mental  status change. EXAM: CT HEAD WITHOUT CONTRAST TECHNIQUE: Contiguous axial images were obtained from the base of the skull through the vertex without intravenous contrast. RADIATION DOSE REDUCTION: This exam was performed according to the departmental dose-optimization program which includes automated exposure control, adjustment of the mA and/or kV according to patient size and/or use of iterative reconstruction technique. COMPARISON:  None Available. FINDINGS: Brain: No evidence of acute infarction, hemorrhage, hydrocephalus, extra-axial collection or mass lesion/mass effect. Vascular: No hyperdense vessel or unexpected calcification. Skull: Normal. Negative for fracture or focal lesion. Sinuses/Orbits: Partial opacification of the right ethmoid air cells with mild mucosal thickening involving the sphenoid sinus and both maxillary sinuses. Other: None IMPRESSION: 1. No acute intracranial abnormalities. 2. Mild paranasal sinus disease. Electronically Signed   By: Waddell Calk M.D.   On: 02/14/2024 11:59   CT ABDOMEN PELVIS W CONTRAST Result Date: 02/14/2024 CLINICAL DATA:  Abdominal pain. Green loose stools. Episodes of incontinence. Vomiting for 2 weeks. EXAM: CT ABDOMEN AND PELVIS WITH CONTRAST TECHNIQUE: Multidetector CT imaging of the abdomen and pelvis was performed using the standard protocol following bolus administration of intravenous contrast. RADIATION DOSE REDUCTION: This exam was performed according to the departmental dose-optimization program which includes automated exposure control, adjustment of the mA and/or kV according to patient size and/or use of iterative reconstruction technique. CONTRAST:  OMNIPAQUE  IOHEXOL  300 MG/ML  SOLN COMPARISON:  01/03/2024 FINDINGS: Lower chest: No pleural fluid or consolidative change. Hepatobiliary: No focal liver abnormality. Normal appearance of the gallbladder. No bile duct dilatation. Pancreas: Unremarkable. No pancreatic ductal dilatation or  surrounding inflammatory changes. Spleen: Normal in size without focal abnormality. Adrenals/Urinary Tract: Adrenal glands are unremarkable. Kidneys are normal, without renal calculi, focal lesion, or hydronephrosis. Bladder is unremarkable. Stomach/Bowel: Scattered air-filled loops of nondilated small bowel identified. No significant small bowel wall thickening or inflammation identified. The appendix is visualized and appears normal. Contrast material is identified throughout the colon and rectum. Equivocal wall thickening involving the ascending and transverse colon. Vascular/Lymphatic: No significant vascular findings are present. No enlarged abdominal or pelvic lymph nodes. Reproductive: Uterus and bilateral adnexa are unremarkable. Other: No free fluid or fluid collections. No signs of pneumoperitoneum. Musculoskeletal: No acute or significant osseous findings. IMPRESSION: 1. Equivocal wall  thickening involving the ascending and transverse colon. Cannot exclude mild colitis. 2. Air seen within scattered nondilated loops of small bowel is nonspecific but may be seen in the setting of enteritis. 3. No signs of bowel obstruction. Normal appendix. Electronically Signed   By: Waddell Calk M.D.   On: 02/14/2024 06:58    EKG: Independently reviewed.  Sinus arrhythmia 84 bpm.  Assessment/Plan Principal Problem:   Intractable nausea and vomiting Active Problems:   Substance induced mood disorder (HCC)   Drug abuse (HCC)    Intractable nausea and vomiting with diarrhea - Clear liquid diet and advance as tolerated - Continue IV fluid for hydration - Stool GI panel ordered and pending - No recent antibiotic use noted - IV Zofran  as needed for nausea or vomiting - Questionable opioid withdrawal symptoms - Urine toxicology negative for opiates  Transient encephalopathy - Likely due to medication side effect - CT head negative and patient improved with Ativan  - Continue to monitor  closely  Anxiety/depression - Continue home medications as prior  Tobacco abuse - Counseled on cessation  History of polysubstance abuse - Noted to be positive for cocaine use  DVT prophylaxis: Lovenox  Code Status: Full Family Communication: None at bedside, tried calling brother with no response Disposition Plan: Admit for evaluation of possible gastroenteritis/colitis Consults called: None Admission status: Observation, MedSurg  Severity of Illness: The appropriate patient status for this patient is OBSERVATION. Observation status is judged to be reasonable and necessary in order to provide the required intensity of service to ensure the patient's safety. The patient's presenting symptoms, physical exam findings, and initial radiographic and laboratory data in the context of their medical condition is felt to place them at decreased risk for further clinical deterioration. Furthermore, it is anticipated that the patient will be medically stable for discharge from the hospital within 2 midnights of admission.    Sarita Hakanson D Maree DO [facility]  If 7PM-7AM, please contact night-coverage [email or web address]  02/14/2024, 2:09 PM

## 2024-02-14 NOTE — ED Provider Notes (Signed)
 CT scan shows possible colitis.  Patient continued with abdominal pain and nausea.  She also had significant altered mental status that included staring off into space and not answering her questions appropriately.  She had stiffening of her muscles, but patient was able to focus and answer questions.  Patient was given some Ativan  and she seemed to improve some.  She is going to be admitted to the medicine   Suzette Pac, MD 02/14/24 [phone]

## 2024-02-14 NOTE — ED Notes (Signed)
 Only one set of blood cx obtained prior to abx, pt difficult stick

## 2024-02-14 NOTE — ED Notes (Signed)
 Pt returned from CT. Pt more restful with no tensing of extremities. When asked if she felt better she stated kind of. When asked to rate pain she stating I dont know. States nausea is better.

## 2024-02-14 NOTE — ED Notes (Addendum)
 Pt sometimes just staring with mouth open but responds and responds appropriately when asked questions when she does this. Pupils perrla. Pt slightly restless with wiggling lower extremities at times. No grimacing or guarding noted. Pt states has been days since her last cocaine use.

## 2024-02-14 NOTE — Progress Notes (Signed)
Pt states she is homeless

## 2024-02-14 NOTE — ED Notes (Signed)
 Taken to CT.

## 2024-02-14 NOTE — ED Notes (Signed)
 Date and time results received: 02/14/24 1148 (use smartphrase .now to insert current time)  Test: lactic Critical Value: 2.0  Name of Provider Notified: Dr Suzette  Orders Received? Or Actions Taken?: Orders Received - See Orders for details

## 2024-02-14 NOTE — ED Provider Notes (Signed)
 [facility] Emergency Department Provider Note MRN:  [id number]  Arrival date & time: 02/14/24     Chief Complaint   Abdominal Pain and Emesis   History of Present Illness   Abigail [name] is a 33 y.o. year-old female with a history of polysubstance abuse presenting to the ED with chief complaint of abdominal pain.  1 week of abdominal pain with nausea vomiting diarrhea.  Says she is vomiting up blood.  Says she is pooping the bed.  Has lost control of her bowels because it is so watery.  Review of Systems  A thorough review of systems was obtained and all systems are negative except as noted in the HPI and PMH.   Patient's Health History    Past Medical History:  Diagnosis Date   Anemia    Anxiety    Bipolar 1 disorder (HCC)    BV (bacterial vaginosis) 07/17/2015   Chronic back pain    Depression    Drug abuse (HCC) 07/17/2015   Fracture, ribs leftside   Liver laceration    Pneumothorax left   Polysubstance abuse (HCC)    opiods, cocaine, marijuana, heroin   PTSD (post-traumatic stress disorder)    PTSD (post-traumatic stress disorder)    Substance induced mood disorder (HCC)     Past Surgical History:  Procedure Laterality Date   ADENOIDECTOMY     BLADDER SURGERY     urethral dilatation and bladder dilitation   ORIF ANKLE FRACTURE Right 09/01/2015   Procedure: OPEN REDUCTION INTERNAL FIXATION (ORIF)  OPEN FOOT FRACTURES DISLOCATION;  Surgeon: Ozell Bruch, MD;  Location: [facility] OR;  Service: Orthopedics;  Laterality: Right;   TONSILLECTOMY      Family History  Problem Relation Age of Onset   Anxiety disorder Mother    Miscarriages / Stillbirths Mother    Hypertension Father    Heart disease Father    Depression Father    Bipolar disorder Father    Thyroid disease Father    Anxiety disorder Father    Early death Father    Depression Brother    Heart disease Brother    Hypertension Brother    Diabetes Brother        borderline    Anxiety disorder Brother    Bipolar disorder Brother    Thyroid disease Brother    Diabetes Maternal Grandmother    Heart disease Maternal Grandmother    Diabetes Maternal Grandfather    Heart disease Maternal Grandfather     Social History   Socioeconomic History   Marital status: Single    Spouse name: Not on file   Number of children: Not on file   Years of education: Not on file   Highest education level: Not on file  Occupational History   Occupation: Consulting civil engineer    Comment: Business Admin   Occupation: [employer]  Tobacco Use   Smoking status: Every Day    Current packs/day: 0.00    Average packs/day: 1 pack/day for 9.0 years (9.0 ttl pk-yrs)    Types: Cigarettes    Start date: 08/31/2006    Last attempt to quit: 09/01/2015    Years since quitting: 8.4   Smokeless tobacco: Never  Vaping Use   Vaping status: Former  Substance and Sexual Activity   Alcohol use: Not Currently    Alcohol/week: 6.0 - 8.0 standard drinks of alcohol    Types: 6 - 8 Cans of beer per week   Drug use: Not Currently  Types: Marijuana, Methamphetamines    Comment: used today   Sexual activity: Not Currently    Partners: Male    Birth control/protection: None  Other Topics Concern   Not on file  Social History Narrative   Grew up in [place], [place]. 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 Not married. Has two children. Has custody of one child, other child with brother. Has one year old and 50 year old. Has a babysitter that takes care of child. Works at [employer]. Sleeping well. No drug use in over 18 months.    Social Drivers of Corporate investment banker Strain: Not on file  Food Insecurity: Not on file  Transportation Needs: Not on file  Physical Activity: Not on file  Stress: Not on file  Social Connections: Not on file  Intimate Partner Violence: Not on file     Physical Exam   Vitals:   02/14/24 0452 02/14/24 0500  BP:  (!) 158/90  Pulse:  (!) 105  Resp: 20 20   Temp:  99.3 F (37.4 C)  SpO2:  100%    CONSTITUTIONAL: Well-appearing, NAD NEURO/PSYCH:  Alert and oriented x 3, no focal deficits EYES:  eyes equal and reactive ENT/NECK:  no LAD, no JVD CARDIO: Regular rate, well-perfused, normal S1 and S2 PULM:  CTAB no wheezing or rhonchi GI/GU: Soft, nondistended, does not allow for much exam, pulls examiner's hand away MSK/SPINE:  No gross deformities, no edema SKIN:  no rash, atraumatic   *Additional and/or pertinent findings included in MDM below  Diagnostic and Interventional Summary    EKG Interpretation Date/Time:    Ventricular Rate:    PR Interval:    QRS Duration:    QT Interval:    QTC Calculation:   R Axis:      Text Interpretation:         Labs Reviewed  CBC - Abnormal; Notable for the following components:      Result Value   RBC 5.63 (*)    MCV 78.9 (*)    MCH 24.2 (*)    RDW 15.9 (*)    All other components within normal limits  COMPREHENSIVE METABOLIC PANEL WITH GFR - Abnormal; Notable for the following components:   Glucose, Bld 112 (*)    Total Protein 9.4 (*)    Anion gap 16 (*)    All other components within normal limits  LIPASE, BLOOD  PREGNANCY, URINE  URINALYSIS, ROUTINE W REFLEX MICROSCOPIC  POC URINE PREG, ED    CT ABDOMEN PELVIS W CONTRAST    (Results Pending)    Medications  lactated ringers  bolus 1,000 mL (0 mLs Intravenous Stopped 02/14/24 0647)  ondansetron  (ZOFRAN ) injection 4 mg (4 mg Intravenous Given 02/14/24 0507)  HYDROmorphone  (DILAUDID ) injection 1 mg (1 mg Intravenous Given 02/14/24 0508)  droperidol  (INAPSINE ) 2.5 MG/ML injection 2.5 mg (2.5 mg Intravenous Given 02/14/24 0644)  iohexol  (OMNIPAQUE ) 300 MG/ML solution 100 mL (100 mLs Intravenous Contrast Given 02/14/24 9365)     Procedures  /  Critical Care Procedures  ED Course and Medical Decision Making  Initial Impression and Ddx Suspect gastroenteritis.  Suspect some embellishment.  Vitals reassuring.  Similar presentation  back in May diagnosed with enterocolitis, also had hypokalemia.  Past medical/surgical history that increases complexity of ED encounter: Substance use disorder  Interpretation of Diagnostics I personally reviewed the Laboratory Testing and my interpretation is as follows: No significant blood count or electrolyte disturbance.  CT pending  Patient Reassessment and Ultimate  Disposition/Management     Send with oncoming provider at shift change.  Patient management required discussion with the following services or consulting groups:  None  Complexity of Problems Addressed Acute illness or injury that poses threat of life of bodily function  Additional Data Reviewed and Analyzed Further history obtained from: Prior labs/imaging results  Additional Factors Impacting ED Encounter Risk Use of parenteral controlled substances and Consideration of hospitalization  Ozell HERO. Theadore, MD [facility] Emergency Medicine [facility] [email or web address]  Final Clinical Impressions(s) / ED Diagnoses     ICD-10-CM   1. Epigastric pain  R10.13       ED Discharge Orders     None        Discharge Instructions Discussed with and Provided to Patient:   Discharge Instructions   None      Theadore Ozell HERO, MD 02/14/24 [phone]

## 2024-02-14 NOTE — ED Notes (Signed)
Edp aware of temp

## 2024-02-14 NOTE — ED Notes (Addendum)
 Pt involuntary movements worse but pt responding. Edp at bedside. Pt tenses legs and sometimes arms but responsive and then asking her to relax she does for a short time

## 2024-02-14 NOTE — ED Notes (Signed)
 Pt resting. Asleep when first entered. Pt then started moaning and stating pain was 10. C/o nausea. Would not answer most questions, had to ask multiple times the same question. Nad.

## 2024-02-14 NOTE — ED Triage Notes (Signed)
 Pt c/o of abdominal pain for the past week. States he has been having green loose stools. Has had episodes of incontinence. Also states she has been vomiting for the past two weeks. Has not taken any type of medication to help with symptoms.

## 2024-02-14 NOTE — ED Notes (Signed)
 Phlebotomy at bedside.

## 2024-02-15 DIAGNOSIS — R112 Nausea with vomiting, unspecified: Secondary | ICD-10-CM | POA: Diagnosis not present

## 2024-02-15 NOTE — Progress Notes (Signed)
 Patient left AMA stating that she was not getting pain medication and knew that she would not be given enough. Patient educated what could happen to her and that anything that does happen will be her responsibility. Patient states understanding and paperwork signed, MD made aware,  patient left.

## 2024-02-15 NOTE — Discharge Summary (Signed)
 Physician Discharge Summary  [name] FMW:[id number] DOB: 01/31/1991 DOA: 02/14/2024  PCP: Pcp, No  Admit date: 02/14/2024  Discharge date: 02/15/2024 AMA DISCHARGE  Admitted From:Home  Disposition:  AMA DISCHARGE  Discharge Condition:Stable  CODE STATUS: Full  Brief/Interim Summary: Abigail [name] is a 33 y.o. female with medical history significant for history of polysubstance abuse with cocaine, tobacco abuse, and anxiety/depression who presented to the ED with complaints of abdominal pain along with nausea, vomiting, diarrhea for the last 1 week.  She appears to have been vomiting up some blood as well and has been defecating in her bed as she has lost control of her bowels since it is so watery.  Patient was admitted for evaluation of intractable nausea and vomiting along with diarrhea and imaging appeared to demonstrate some findings of enteritis and colitis.  Urine toxicology was positive for cocaine use.  GI pathogen panel ordered for stool studies, but this was never collected.  Unfortunately, on the morning of 7/7 patient was eager for discharge and appeared to have full decision-making capacity and understood the risks of leaving according to the nursing staff.  She stated to the nurse that she would not receive any IV pain medications and she knew this and therefore she did not want to stay in the hospital.  I personally was not able to evaluate the patient prior to her leaving the hospital.  She was adamant that she wanted to leave the hospital AGAINST MEDICAL ADVICE and had signed paperwork.  No other acute events or concerns were noted overnight.  Discharge Diagnoses:  Principal Problem:   Intractable nausea and vomiting Active Problems:   Substance induced mood disorder (HCC)   Drug abuse (HCC)  Principal discharge diagnosis: Intractable nausea and vomiting with diarrhea with concerns for enteritis/colitis versus possible opioid withdrawal symptoms.  Noted cocaine  use.    Allergies  Allergen Reactions   Latex Swelling   Penicillins Other (See Comments)    REACTION: Childhood allergy/unknown reaction Has patient had a PCN reaction causing immediate rash, facial/tongue/throat swelling, SOB or lightheadedness with Has patient had a PCN reaction causing severe rash involving mucus membranes or skin necrosis: NO Has patient had a PCN reaction that required hospitalization NO Has patient had a PCN reaction occurring within the last 10 years: NO If all of the above answers are NO, then may proceed with Cephalosporin use.    Consultations: None   Procedures/Studies: CT Head Wo Contrast Result Date: 02/14/2024 CLINICAL DATA:  Mental status change. EXAM: CT HEAD WITHOUT CONTRAST TECHNIQUE: Contiguous axial images were obtained from the base of the skull through the vertex without intravenous contrast. RADIATION DOSE REDUCTION: This exam was performed according to the departmental dose-optimization program which includes automated exposure control, adjustment of the mA and/or kV according to patient size and/or use of iterative reconstruction technique. COMPARISON:  None Available. FINDINGS: Brain: No evidence of acute infarction, hemorrhage, hydrocephalus, extra-axial collection or mass lesion/mass effect. Vascular: No hyperdense vessel or unexpected calcification. Skull: Normal. Negative for fracture or focal lesion. Sinuses/Orbits: Partial opacification of the right ethmoid air cells with mild mucosal thickening involving the sphenoid sinus and both maxillary sinuses. Other: None IMPRESSION: 1. No acute intracranial abnormalities. 2. Mild paranasal sinus disease. Electronically Signed   By: Waddell Calk M.D.   On: 02/14/2024 11:59   CT ABDOMEN PELVIS W CONTRAST Result Date: 02/14/2024 CLINICAL DATA:  Abdominal pain. Green loose stools. Episodes of incontinence. Vomiting for 2 weeks. EXAM: CT ABDOMEN AND  PELVIS WITH CONTRAST TECHNIQUE: Multidetector CT  imaging of the abdomen and pelvis was performed using the standard protocol following bolus administration of intravenous contrast. RADIATION DOSE REDUCTION: This exam was performed according to the departmental dose-optimization program which includes automated exposure control, adjustment of the mA and/or kV according to patient size and/or use of iterative reconstruction technique. CONTRAST:  OMNIPAQUE  IOHEXOL  300 MG/ML  SOLN COMPARISON:  01/03/2024 FINDINGS: Lower chest: No pleural fluid or consolidative change. Hepatobiliary: No focal liver abnormality. Normal appearance of the gallbladder. No bile duct dilatation. Pancreas: Unremarkable. No pancreatic ductal dilatation or surrounding inflammatory changes. Spleen: Normal in size without focal abnormality. Adrenals/Urinary Tract: Adrenal glands are unremarkable. Kidneys are normal, without renal calculi, focal lesion, or hydronephrosis. Bladder is unremarkable. Stomach/Bowel: Scattered air-filled loops of nondilated small bowel identified. No significant small bowel wall thickening or inflammation identified. The appendix is visualized and appears normal. Contrast material is identified throughout the colon and rectum. Equivocal wall thickening involving the ascending and transverse colon. Vascular/Lymphatic: No significant vascular findings are present. No enlarged abdominal or pelvic lymph nodes. Reproductive: Uterus and bilateral adnexa are unremarkable. Other: No free fluid or fluid collections. No signs of pneumoperitoneum. Musculoskeletal: No acute or significant osseous findings. IMPRESSION: 1. Equivocal wall thickening involving the ascending and transverse colon. Cannot exclude mild colitis. 2. Air seen within scattered nondilated loops of small bowel is nonspecific but may be seen in the setting of enteritis. 3. No signs of bowel obstruction. Normal appendix. Electronically Signed   By: Waddell Calk M.D.   On: 02/14/2024 06:58     Discharge  Exam: Vitals:   02/14/24 1949 02/15/24 0400  BP: 136/89 136/65  Pulse: 70 73  Resp: 18 18  Temp: 97.9 F (36.6 C) 98.9 F (37.2 C)  SpO2: 100% 100%   Vitals:   02/14/24 1245 02/14/24 1400 02/14/24 1949 02/15/24 0400  BP: (!) 115/53 135/78 136/89 136/65  Pulse: 81 66 70 73  Resp: (!) 21 18 18 18   Temp:  98.5 F (36.9 C) 97.9 F (36.6 C) 98.9 F (37.2 C)  TempSrc:  Oral Oral Oral  SpO2: 97% 100% 100% 100%  Weight:  66.5 kg    Height:  5' 3 (1.6 m)      General: Pt is alert, awake, not in acute distress Cardiovascular: RRR, S1/S2 +, no rubs, no gallops Respiratory: CTA bilaterally, no wheezing, no rhonchi Abdominal: Soft, NT, ND, bowel sounds + Extremities: no edema, no cyanosis    The results of significant diagnostics from this hospitalization (including imaging, microbiology, ancillary and laboratory) are listed below for reference.     Microbiology: Recent Results (from the past 240 hours)  Resp panel by RT-PCR (RSV, Flu A&B, Covid) Anterior Nasal Swab     Status: None   Collection Time: 02/14/24  8:50 AM   Specimen: Anterior Nasal Swab  Result Value Ref Range Status   SARS Coronavirus 2 by RT PCR NEGATIVE NEGATIVE Final    Comment: (NOTE) SARS-CoV-2 target nucleic acids are NOT DETECTED.  The SARS-CoV-2 RNA is generally detectable in upper respiratory specimens during the acute phase of infection. The lowest concentration of SARS-CoV-2 viral copies this assay can detect is 138 copies/mL. A negative result does not preclude SARS-Cov-2 infection and should not be used as the sole basis for treatment or other patient management decisions. A negative result may occur with  improper specimen collection/handling, submission of specimen other than nasopharyngeal swab, presence of viral mutation(s) within the areas targeted  by this assay, and inadequate number of viral copies(<138 copies/mL). A negative result must be combined with clinical observations, patient  history, and epidemiological information. The expected result is Negative.  Fact Sheet for Patients:  BloggerCourse.com  Fact Sheet for Healthcare Providers:  SeriousBroker.it  This test is no t yet approved or cleared by the [place]  FDA and  has been authorized for detection and/or diagnosis of SARS-CoV-2 by FDA under an Emergency Use Authorization (EUA). This EUA will remain  in effect (meaning this test can be used) for the duration of the COVID-19 declaration under Section 564(b)(1) of the Act, 21 U.S.C.section 360bbb-3(b)(1), unless the authorization is terminated  or revoked sooner.       Influenza A by PCR NEGATIVE NEGATIVE Final   Influenza B by PCR NEGATIVE NEGATIVE Final    Comment: (NOTE) The Xpert Xpress SARS-CoV-2/FLU/RSV plus assay is intended as an aid in the diagnosis of influenza from Nasopharyngeal swab specimens and should not be used as a sole basis for treatment. Nasal washings and aspirates are unacceptable for Xpert Xpress SARS-CoV-2/FLU/RSV testing.  Fact Sheet for Patients: BloggerCourse.com  Fact Sheet for Healthcare Providers: SeriousBroker.it  This test is not yet approved or cleared by the [place]  FDA and has been authorized for detection and/or diagnosis of SARS-CoV-2 by FDA under an Emergency Use Authorization (EUA). This EUA will remain in effect (meaning this test can be used) for the duration of the COVID-19 declaration under Section 564(b)(1) of the Act, 21 U.S.C. section 360bbb-3(b)(1), unless the authorization is terminated or revoked.     Resp Syncytial Virus by PCR NEGATIVE NEGATIVE Final    Comment: (NOTE) Fact Sheet for Patients: BloggerCourse.com  Fact Sheet for Healthcare Providers: SeriousBroker.it  This test is not yet approved or cleared by the [place]  FDA  and has been authorized for detection and/or diagnosis of SARS-CoV-2 by FDA under an Emergency Use Authorization (EUA). This EUA will remain in effect (meaning this test can be used) for the duration of the COVID-19 declaration under Section 564(b)(1) of the Act, 21 U.S.C. section 360bbb-3(b)(1), unless the authorization is terminated or revoked.  Performed at [facility], [address], [place], [place] [address]   Blood culture (routine x 2)     Status: None (Preliminary result)   Collection Time: 02/14/24 10:01 AM   Specimen: BLOOD RIGHT HAND  Result Value Ref Range Status   Specimen Description BLOOD RIGHT HAND AEROBIC BOTTLE ONLY  Final   Special Requests Blood Culture adequate volume  Final   Culture   Final    NO GROWTH < 24 HOURS Performed at [facility], [address] [place], [place] [address]    Report Status PENDING  Incomplete  Blood culture (routine x 2)     Status: None (Preliminary result)   Collection Time: 02/14/24 10:06 AM   Specimen: BLOOD LEFT HAND  Result Value Ref Range Status   Specimen Description BLOOD LEFT HAND AEROBIC BOTTLE ONLY  Final   Special Requests Blood Culture adequate volume  Final   Culture   Final    NO GROWTH < 24 HOURS Performed at [facility], [address], [place], [place] [address]    Report Status PENDING  Incomplete     Labs: BNP (last 3 results) No results for input(s): BNP in the last 8760 hours. Basic Metabolic Panel: Recent Labs  Lab 02/14/24 0536  NA 138  K 3.9  CL 100  CO2 22  GLUCOSE 112*  BUN 11  CREATININE 0.84  CALCIUM 9.4   Liver Function Tests: Recent Labs  Lab 02/14/24 0536  AST 27  ALT 24  ALKPHOS 91  BILITOT 0.9  PROT 9.4*  ALBUMIN 4.4   Recent Labs  Lab 02/14/24 0536  LIPASE 28   No results for input(s): AMMONIA in the last 168 hours. CBC: Recent Labs  Lab 02/14/24 0536  WBC 9.3  HGB 13.6  HCT 44.4  MCV 78.9*  PLT 286   Cardiac Enzymes: No results for input(s):  CKTOTAL, CKMB, CKMBINDEX, TROPONINI in the last 168 hours. BNP: Invalid input(s): POCBNP CBG: No results for input(s): GLUCAP in the last 168 hours. D-Dimer No results for input(s): DDIMER in the last 72 hours. Hgb A1c No results for input(s): HGBA1C in the last 72 hours. Lipid Profile No results for input(s): CHOL, HDL, LDLCALC, TRIG, CHOLHDL, LDLDIRECT in the last 72 hours. Thyroid function studies No results for input(s): TSH, T4TOTAL, T3FREE, THYROIDAB in the last 72 hours.  Invalid input(s): FREET3 Anemia work up No results for input(s): VITAMINB12, FOLATE, FERRITIN, TIBC, IRON , RETICCTPCT in the last 72 hours. Urinalysis    Component Value Date/Time   COLORURINE YELLOW 02/14/2024 0625   APPEARANCEUR CLEAR 02/14/2024 0625   APPEARANCEUR Cloudy (A) 07/17/2015 1631   LABSPEC 1.011 02/14/2024 0625   PHURINE 8.0 02/14/2024 0625   GLUCOSEU NEGATIVE 02/14/2024 0625   HGBUR NEGATIVE 02/14/2024 0625   BILIRUBINUR NEGATIVE 02/14/2024 0625   BILIRUBINUR Negative 07/17/2015 1631   KETONESUR NEGATIVE 02/14/2024 0625   PROTEINUR NEGATIVE 02/14/2024 0625   UROBILINOGEN 0.2 01/28/2016 1140   NITRITE NEGATIVE 02/14/2024 0625   LEUKOCYTESUR NEGATIVE 02/14/2024 0625   Sepsis Labs Recent Labs  Lab 02/14/24 0536  WBC 9.3   Microbiology Recent Results (from the past 240 hours)  Resp panel by RT-PCR (RSV, Flu A&B, Covid) Anterior Nasal Swab     Status: None   Collection Time: 02/14/24  8:50 AM   Specimen: Anterior Nasal Swab  Result Value Ref Range Status   SARS Coronavirus 2 by RT PCR NEGATIVE NEGATIVE Final    Comment: (NOTE) SARS-CoV-2 target nucleic acids are NOT DETECTED.  The SARS-CoV-2 RNA is generally detectable in upper respiratory specimens during the acute phase of infection. The lowest concentration of SARS-CoV-2 viral copies this assay can detect is 138 copies/mL. A negative result does not preclude  SARS-Cov-2 infection and should not be used as the sole basis for treatment or other patient management decisions. A negative result may occur with  improper specimen collection/handling, submission of specimen other than nasopharyngeal swab, presence of viral mutation(s) within the areas targeted by this assay, and inadequate number of viral copies(<138 copies/mL). A negative result must be combined with clinical observations, patient history, and epidemiological information. The expected result is Negative.  Fact Sheet for Patients:  BloggerCourse.com  Fact Sheet for Healthcare Providers:  SeriousBroker.it  This test is no t yet approved or cleared by the [place]  FDA and  has been authorized for detection and/or diagnosis of SARS-CoV-2 by FDA under an Emergency Use Authorization (EUA). This EUA will remain  in effect (meaning this test can be used) for the duration of the COVID-19 declaration under Section 564(b)(1) of the Act, 21 U.S.C.section 360bbb-3(b)(1), unless the authorization is terminated  or revoked sooner.       Influenza A by PCR NEGATIVE NEGATIVE Final   Influenza B by PCR NEGATIVE NEGATIVE Final    Comment: (NOTE) The Xpert Xpress SARS-CoV-2/FLU/RSV plus assay is intended as an  aid in the diagnosis of influenza from Nasopharyngeal swab specimens and should not be used as a sole basis for treatment. Nasal washings and aspirates are unacceptable for Xpert Xpress SARS-CoV-2/FLU/RSV testing.  Fact Sheet for Patients: BloggerCourse.com  Fact Sheet for Healthcare Providers: SeriousBroker.it  This test is not yet approved or cleared by the [place]  FDA and has been authorized for detection and/or diagnosis of SARS-CoV-2 by FDA under an Emergency Use Authorization (EUA). This EUA will remain in effect (meaning this test can be used) for the duration of  the COVID-19 declaration under Section 564(b)(1) of the Act, 21 U.S.C. section 360bbb-3(b)(1), unless the authorization is terminated or revoked.     Resp Syncytial Virus by PCR NEGATIVE NEGATIVE Final    Comment: (NOTE) Fact Sheet for Patients: BloggerCourse.com  Fact Sheet for Healthcare Providers: SeriousBroker.it  This test is not yet approved or cleared by the [place]  FDA and has been authorized for detection and/or diagnosis of SARS-CoV-2 by FDA under an Emergency Use Authorization (EUA). This EUA will remain in effect (meaning this test can be used) for the duration of the COVID-19 declaration under Section 564(b)(1) of the Act, 21 U.S.C. section 360bbb-3(b)(1), unless the authorization is terminated or revoked.  Performed at [facility], [address], [place], [place] [address]   Blood culture (routine x 2)     Status: None (Preliminary result)   Collection Time: 02/14/24 10:01 AM   Specimen: BLOOD RIGHT HAND  Result Value Ref Range Status   Specimen Description BLOOD RIGHT HAND AEROBIC BOTTLE ONLY  Final   Special Requests Blood Culture adequate volume  Final   Culture   Final    NO GROWTH < 24 HOURS Performed at [facility], [address], [place], [place] [address]    Report Status PENDING  Incomplete  Blood culture (routine x 2)     Status: None (Preliminary result)   Collection Time: 02/14/24 10:06 AM   Specimen: BLOOD LEFT HAND  Result Value Ref Range Status   Specimen Description BLOOD LEFT HAND AEROBIC BOTTLE ONLY  Final   Special Requests Blood Culture adequate volume  Final   Culture   Final    NO GROWTH < 24 HOURS Performed at [facility], [address], [place], [place] [address]    Report Status PENDING  Incomplete     Time coordinating discharge: 35 minutes  SIGNED:   Adron JONETTA Fairly, DO [facility] 02/15/2024, 8:49 AM  If 7PM-7AM, please contact  night-coverage [email or web address]

## 2024-02-19 LAB — CULTURE, BLOOD (ROUTINE X 2)
Culture: NO GROWTH
Special Requests: ADEQUATE

## 2024-02-21 LAB — CULTURE, BLOOD (ROUTINE X 2): Special Requests: ADEQUATE

## 2024-05-25 ENCOUNTER — Emergency Department (HOSPITAL_COMMUNITY)
Admission: EM | Admit: 2024-05-25 | Discharge: 2024-05-25 | Disposition: A | Discharge status: Home / Self Care | Payer: MEDICAID | Attending: Emergency Medicine | Admitting: Emergency Medicine

## 2024-05-25 ENCOUNTER — Emergency Department (HOSPITAL_COMMUNITY): Payer: MEDICAID

## 2024-05-25 ENCOUNTER — Encounter (HOSPITAL_COMMUNITY): Payer: Self-pay

## 2024-05-25 ENCOUNTER — Other Ambulatory Visit: Payer: Self-pay

## 2024-05-25 DIAGNOSIS — W540XXA Bitten by dog, initial encounter: Secondary | ICD-10-CM | POA: Insufficient documentation

## 2024-05-25 DIAGNOSIS — S81852A Open bite, left lower leg, initial encounter: Secondary | ICD-10-CM | POA: Diagnosis present

## 2024-05-25 DIAGNOSIS — F17200 Nicotine dependence, unspecified, uncomplicated: Secondary | ICD-10-CM | POA: Insufficient documentation

## 2024-05-25 DIAGNOSIS — Z23 Encounter for immunization: Secondary | ICD-10-CM | POA: Diagnosis not present

## 2024-05-25 DIAGNOSIS — Z91040 Latex allergy status: Secondary | ICD-10-CM | POA: Diagnosis not present

## 2024-05-25 MED ORDER — CEFUROXIME AXETIL 500 MG PO TABS: 500.0000 mg | ORAL_TABLET | Freq: Two times a day (BID) | ORAL | 0 refills | Status: AC

## 2024-05-25 MED ORDER — ONDANSETRON HCL 4 MG/2ML IJ SOLN
4.0000 mg | Freq: Once | INTRAMUSCULAR | Status: DC
  Filled 2024-05-25: qty 2

## 2024-05-25 MED ORDER — CEFUROXIME AXETIL 250 MG PO TABS
500.0000 mg | ORAL_TABLET | Freq: Two times a day (BID) | ORAL | Status: DC
  Administered 2024-05-25: 500 mg via ORAL
  Filled 2024-05-25 (×3): qty 2

## 2024-05-25 MED ORDER — HYDROMORPHONE HCL 1 MG/ML IJ SOLN
1.0000 mg | Freq: Once | INTRAMUSCULAR | Status: DC
  Filled 2024-05-25: qty 1

## 2024-05-25 MED ORDER — TETANUS-DIPHTH-ACELL PERTUSSIS 5-2-15.5 LF-MCG/0.5 IM SUSP
0.5000 mL | Freq: Once | INTRAMUSCULAR | Status: AC
  Administered 2024-05-25: 0.5 mL via INTRAMUSCULAR
  Filled 2024-05-25: qty 0.5

## 2024-05-25 MED ORDER — METRONIDAZOLE 500 MG PO TABS
500.0000 mg | ORAL_TABLET | Freq: Once | ORAL | Status: AC
  Administered 2024-05-25: 500 mg via ORAL
  Filled 2024-05-25: qty 1

## 2024-05-25 MED ORDER — HYDROMORPHONE HCL 1 MG/ML IJ SOLN
2.0000 mg | Freq: Once | INTRAMUSCULAR | Status: AC
  Administered 2024-05-25: 2 mg via INTRAMUSCULAR
  Filled 2024-05-25: qty 2

## 2024-05-25 MED ORDER — ONDANSETRON 4 MG PO TBDP: 4.0000 mg | ORAL_TABLET | Freq: Once | ORAL | Status: DC

## 2024-05-25 MED ORDER — METRONIDAZOLE 500 MG PO TABS: 500.0000 mg | ORAL_TABLET | Freq: Three times a day (TID) | ORAL | 0 refills | Status: AC

## 2024-05-25 MED ORDER — LIDOCAINE-EPINEPHRINE (PF) 2 %-1:200000 IJ SOLN
20.0000 mL | Freq: Once | INTRAMUSCULAR | Status: AC
  Administered 2024-05-25: 20 mL
  Filled 2024-05-25: qty 20

## 2024-05-25 NOTE — Discharge Instructions (Addendum)
 Take the antibiotics as directed.  1 is a cephalosporin which should be fine with your pen still an allergy.  Wound care as directed.  Recommend follow-up to have the wound checked in the next 4 days.  If the dog becomes ill, authorities will notify you that you may need the rabies vaccine series.

## 2024-05-25 NOTE — ED Provider Notes (Signed)
 LACERATION REPAIR Performed by: Margit DELENA Paris Authorized by: Margit DELENA Paris Consent: Verbal consent obtained. Risks and benefits: risks, benefits and alternatives were discussed Consent given by: patient Patient identity confirmed: provided demographic data Prepped and Draped in normal sterile fashion Wound explored  Laceration Location: left lower leg, lateral Laceration Length: 4cm Lac: 4 cm left lower leg, medial Lac: 3 sm left lower leg, medial Lac 3 cm, left lower leg medial  No Foreign Bodies seen or palpated  Anesthesia: local infiltration Local anesthetic: lidocaine  2% w/epinephrine  Anesthetic total: 12 ml  Irrigation method: syringe Amount of cleaning: extensive, wound margins trimmed, wounds irrigated NS  Lacerations lateral, and medial x 2 Skin closure: 3-0  Number of sutures: 11 total  Technique: simple interrupted Loose closures  Multiple 2 cm or less lacerations cleaned extensively and closed loosely with steri-strips.  Patient tolerance: Patient tolerated the procedure well with no immediate complications.    Paris Margit, PA-C 05/25/24 2229    Geraldene Hamilton, MD 05/26/24 1535

## 2024-05-25 NOTE — ED Triage Notes (Signed)
 Pt reports being attacked by friend's dog. Bite to L lower leg/ankle. Believes dog has current vaccinations. Bleeding controlled at present

## 2024-05-25 NOTE — ED Notes (Signed)
 Pt's wounds dressed with non-adhesive and kerlex gauze

## 2024-05-25 NOTE — ED Notes (Signed)
 Animal Control Officer Camellia Caroline confirms dog has current rabies vaccination that expires 06-10-25.

## 2024-05-25 NOTE — ED Notes (Signed)
 Patient verbalizes understanding of discharge instructions. Opportunity for questioning and answers were provided. Armband removed by staff, pt discharged from ED. Wheeled out to car with friend

## 2024-05-25 NOTE — ED Provider Notes (Addendum)
 [place] EMERGENCY DEPARTMENT AT [facility] Provider Note   CSN: [id number] Arrival date & time: 05/25/24  1707     Patient presents with: Animal Bite   Abigail [name] is a 33 y.o. female.   Patient attacked by friends dog shortly prior to arrival sometime this afternoon bites to left lower leg ankle area.  We did get formal report that the dog's rabies vaccines are up-to-date the dog is in custody.  Patient thinks maybe had tetanus shot 10 years ago but will update it either way.  Past medical history is significant for allergy to penicillin.  But they childhood allergy unknown reaction.  Past medical history significant for polysubstance abuse opioids cocaine marijuana heroin bipolar disorder posttraumatic stress disorder anxiety past surgical history significant for tonsillectomy open reduction internal fixation of ankle fracture on the right side.  Patient smokes toe tobacco products every day.       Prior to Admission medications   Medication Sig Start Date End Date Taking? Authorizing Provider  ciprofloxacin  (CIPRO ) 500 MG tablet Take 1 tablet (500 mg total) by mouth 2 (two) times daily. 01/04/24   Raford Lenis, MD  metroNIDAZOLE  (FLAGYL ) 500 MG tablet Take 1 tablet (500 mg total) by mouth 3 (three) times daily. 01/04/24   Raford Lenis, MD  ondansetron  (ZOFRAN -ODT) 4 MG disintegrating tablet Take 1 tablet (4 mg total) by mouth every 8 (eight) hours as needed for nausea or vomiting. 01/04/24   Raford Lenis, MD  potassium chloride  SA (KLOR-CON  M) 20 MEQ tablet Take 1 tablet (20 mEq total) by mouth 3 (three) times daily. 01/04/24   Raford Lenis, MD  cloNIDine  (CATAPRES ) 0.2 MG tablet Take 1 tablet (0.2 mg total) by mouth 2 (two) times daily as needed (withdrawal symptoms). 06/03/20 07/14/20  Haze Lonni PARAS, MD    Allergies: Latex and Penicillins    Review of Systems  Constitutional:  Negative for chills and fever.  HENT:  Negative for ear pain and sore  throat.   Eyes:  Negative for pain and visual disturbance.  Respiratory:  Negative for cough and shortness of breath.   Cardiovascular:  Negative for chest pain and palpitations.  Gastrointestinal:  Negative for abdominal pain and vomiting.  Genitourinary:  Negative for dysuria and hematuria.  Musculoskeletal:  Negative for arthralgias and back pain.  Skin:  Positive for wound. Negative for color change and rash.  Neurological:  Negative for seizures and syncope.  All other systems reviewed and are negative.   Updated Vital Signs BP 123/89   Pulse 98   Temp 98.3 F (36.8 C)   Resp 18   SpO2 100%   Physical Exam Vitals and nursing note reviewed.  Constitutional:      General: She is not in acute distress.    Appearance: Normal appearance. She is well-developed.  HENT:     Head: Normocephalic and atraumatic.  Eyes:     Extraocular Movements: Extraocular movements intact.     Conjunctiva/sclera: Conjunctivae normal.     Pupils: Pupils are equal, round, and reactive to light.  Cardiovascular:     Rate and Rhythm: Normal rate and regular rhythm.     Heart sounds: No murmur heard. Pulmonary:     Effort: Pulmonary effort is normal. No respiratory distress.     Breath sounds: Normal breath sounds.  Abdominal:     Palpations: Abdomen is soft.     Tenderness: There is no abdominal tenderness.  Musculoskeletal:  General: Swelling, tenderness and signs of injury present.     Cervical back: Normal range of motion and neck supple. No rigidity.     Comments: Upper extremity and right lower extremity without evidence of any injuries.  All of the bites are to the left lower leg.  2 lacerations are significant size there is multiple small lacerations.  1 laceration probably measures 4 cm.  With some subcutaneous fat exposed.  The other 1 which is more anterior is a multiple small lacerations and may not be closable.  Dorsalis pedis pulse is 1+.  Sensation intact.  Able to move toes.   No significant ankle swelling.  No abnormality to the left knee.  There is an abrasion on the proximal leg anteriorly.  Skin:    General: Skin is warm and dry.     Capillary Refill: Capillary refill takes less than 2 seconds.  Neurological:     General: No focal deficit present.     Mental Status: She is alert and oriented to person, place, and time.  Psychiatric:        Mood and Affect: Mood normal.     (all labs ordered are listed, but only abnormal results are displayed) Labs Reviewed  CBC WITH DIFFERENTIAL/PLATELET  BASIC METABOLIC PANEL WITH GFR    EKG: None  Radiology: No results found.   Procedures   Medications Ordered in the ED  Tdap (ADACEL) injection 0.5 mL (has no administration in time range)  ondansetron  (ZOFRAN ) injection 4 mg (has no administration in time range)  HYDROmorphone  (DILAUDID ) injection 1 mg (has no administration in time range)                                    Medical Decision Making Amount and/or Complexity of Data Reviewed Labs: ordered. Radiology: ordered.  Risk Prescription drug management.   We got formal report that the dog's rabies vaccine is up to date.  The dog is in custody as well.  Patient will need tetanus shot.  Will need x-rays of the area and then we will probably may need some staple repair.  A lot of the open wounds will heal by secondary intention.  If there is evidence of a fracture then we will contact Ortho.  X-rays without any bony abnormalities or foreign bodies.  Canceled patient's labs.  Patient's tetanus updated here.  My physician assistant is going to go ahead and closed the wounds.  Initially I thought it could be done with staples.  But is going to need some sutures to close the 2 larger lacerations better.  Suture repair as per physician assistant.  Because of patient's penicillin allergy we will go ahead and treat with Flagyl  and cefuroxime.  Final diagnoses:  Dog bite of left lower leg, initial  encounter    ED Discharge Orders     None          Geraldene Hamilton, MD 05/25/24 AVA Geraldene Hamilton, MD 05/25/24 [id number]    Geraldene Hamilton, MD 05/25/24 [id number]

## 2024-08-05 ENCOUNTER — Ambulatory Visit: Payer: MEDICAID | Admitting: Physician Assistant
